# Patient Record
Sex: Male | Born: 1965 | Race: White | Marital: Single
Health system: Southern US, Community
[De-identification: ages and names within clinical notes are randomized; demographics above are authoritative.]

## PROBLEM LIST (undated history)

## (undated) DIAGNOSIS — I10 Essential (primary) hypertension: Secondary | ICD-10-CM

## (undated) DIAGNOSIS — F329 Major depressive disorder, single episode, unspecified: Secondary | ICD-10-CM

## (undated) DIAGNOSIS — J449 Chronic obstructive pulmonary disease, unspecified: Secondary | ICD-10-CM

## (undated) DIAGNOSIS — F419 Anxiety disorder, unspecified: Secondary | ICD-10-CM

## (undated) DIAGNOSIS — I639 Cerebral infarction, unspecified: Secondary | ICD-10-CM

## (undated) DIAGNOSIS — E785 Hyperlipidemia, unspecified: Secondary | ICD-10-CM

## (undated) DIAGNOSIS — K219 Gastro-esophageal reflux disease without esophagitis: Secondary | ICD-10-CM

## (undated) DIAGNOSIS — F32A Depression, unspecified: Secondary | ICD-10-CM

## (undated) DIAGNOSIS — G8929 Other chronic pain: Secondary | ICD-10-CM

## (undated) HISTORY — DX: Chronic obstructive pulmonary disease, unspecified: J44.9

## (undated) HISTORY — DX: Depression, unspecified: F32.A

## (undated) HISTORY — PX: OTHER SURGICAL HISTORY: SHX169

## (undated) HISTORY — PX: LEG SURGERY: SHX1003

## (undated) HISTORY — DX: Hyperlipidemia, unspecified: E78.5

## (undated) HISTORY — DX: Gastro-esophageal reflux disease without esophagitis: K21.9

## (undated) HISTORY — DX: Major depressive disorder, single episode, unspecified: F32.9

## (undated) HISTORY — DX: Anxiety disorder, unspecified: F41.9

## (undated) HISTORY — DX: Other chronic pain: G89.29

## (undated) HISTORY — DX: Essential (primary) hypertension: I10

## (undated) HISTORY — DX: Cerebral infarction, unspecified: I63.9

---

## 2009-06-08 ENCOUNTER — Ambulatory Visit: Payer: Self-pay

## 2010-08-11 ENCOUNTER — Ambulatory Visit: Payer: Self-pay

## 2011-02-09 ENCOUNTER — Ambulatory Visit: Payer: Self-pay | Admitting: Internal Medicine

## 2011-02-09 DIAGNOSIS — E041 Nontoxic single thyroid nodule: Secondary | ICD-10-CM | POA: Diagnosis not present

## 2011-02-09 DIAGNOSIS — E042 Nontoxic multinodular goiter: Secondary | ICD-10-CM | POA: Diagnosis not present

## 2011-02-15 DIAGNOSIS — F17200 Nicotine dependence, unspecified, uncomplicated: Secondary | ICD-10-CM | POA: Diagnosis not present

## 2011-02-15 DIAGNOSIS — Z23 Encounter for immunization: Secondary | ICD-10-CM | POA: Diagnosis not present

## 2011-02-15 DIAGNOSIS — R0602 Shortness of breath: Secondary | ICD-10-CM | POA: Diagnosis not present

## 2011-05-23 DIAGNOSIS — E018 Other iodine-deficiency related thyroid disorders and allied conditions: Secondary | ICD-10-CM | POA: Diagnosis not present

## 2011-05-23 DIAGNOSIS — I10 Essential (primary) hypertension: Secondary | ICD-10-CM | POA: Diagnosis not present

## 2011-05-23 DIAGNOSIS — F17200 Nicotine dependence, unspecified, uncomplicated: Secondary | ICD-10-CM | POA: Diagnosis not present

## 2011-05-23 DIAGNOSIS — R0602 Shortness of breath: Secondary | ICD-10-CM | POA: Diagnosis not present

## 2011-05-23 DIAGNOSIS — E049 Nontoxic goiter, unspecified: Secondary | ICD-10-CM | POA: Diagnosis not present

## 2011-07-07 DIAGNOSIS — F17200 Nicotine dependence, unspecified, uncomplicated: Secondary | ICD-10-CM | POA: Diagnosis not present

## 2011-07-07 DIAGNOSIS — J45909 Unspecified asthma, uncomplicated: Secondary | ICD-10-CM | POA: Diagnosis not present

## 2011-07-07 DIAGNOSIS — I10 Essential (primary) hypertension: Secondary | ICD-10-CM | POA: Diagnosis not present

## 2011-07-07 DIAGNOSIS — R0602 Shortness of breath: Secondary | ICD-10-CM | POA: Diagnosis not present

## 2011-09-07 DIAGNOSIS — I10 Essential (primary) hypertension: Secondary | ICD-10-CM | POA: Diagnosis not present

## 2011-09-07 DIAGNOSIS — M545 Low back pain: Secondary | ICD-10-CM | POA: Diagnosis not present

## 2011-09-07 DIAGNOSIS — F17200 Nicotine dependence, unspecified, uncomplicated: Secondary | ICD-10-CM | POA: Diagnosis not present

## 2011-09-07 DIAGNOSIS — M1990 Unspecified osteoarthritis, unspecified site: Secondary | ICD-10-CM | POA: Diagnosis not present

## 2011-10-17 DIAGNOSIS — L02419 Cutaneous abscess of limb, unspecified: Secondary | ICD-10-CM | POA: Diagnosis not present

## 2011-10-17 DIAGNOSIS — J45909 Unspecified asthma, uncomplicated: Secondary | ICD-10-CM | POA: Diagnosis not present

## 2011-10-17 DIAGNOSIS — M1990 Unspecified osteoarthritis, unspecified site: Secondary | ICD-10-CM | POA: Diagnosis not present

## 2011-10-17 DIAGNOSIS — L03119 Cellulitis of unspecified part of limb: Secondary | ICD-10-CM | POA: Diagnosis not present

## 2011-10-17 DIAGNOSIS — I10 Essential (primary) hypertension: Secondary | ICD-10-CM | POA: Diagnosis not present

## 2011-12-11 DIAGNOSIS — I10 Essential (primary) hypertension: Secondary | ICD-10-CM | POA: Diagnosis not present

## 2011-12-11 DIAGNOSIS — M1990 Unspecified osteoarthritis, unspecified site: Secondary | ICD-10-CM | POA: Diagnosis not present

## 2011-12-11 DIAGNOSIS — M545 Low back pain: Secondary | ICD-10-CM | POA: Diagnosis not present

## 2011-12-11 DIAGNOSIS — J45909 Unspecified asthma, uncomplicated: Secondary | ICD-10-CM | POA: Diagnosis not present

## 2011-12-11 DIAGNOSIS — K219 Gastro-esophageal reflux disease without esophagitis: Secondary | ICD-10-CM | POA: Diagnosis not present

## 2012-01-09 DIAGNOSIS — E782 Mixed hyperlipidemia: Secondary | ICD-10-CM | POA: Diagnosis not present

## 2012-01-09 DIAGNOSIS — E038 Other specified hypothyroidism: Secondary | ICD-10-CM | POA: Diagnosis not present

## 2012-02-01 DIAGNOSIS — F329 Major depressive disorder, single episode, unspecified: Secondary | ICD-10-CM | POA: Diagnosis not present

## 2012-02-13 DIAGNOSIS — F329 Major depressive disorder, single episode, unspecified: Secondary | ICD-10-CM | POA: Diagnosis not present

## 2012-03-20 DIAGNOSIS — F329 Major depressive disorder, single episode, unspecified: Secondary | ICD-10-CM | POA: Diagnosis not present

## 2012-04-30 DIAGNOSIS — M545 Low back pain: Secondary | ICD-10-CM | POA: Diagnosis not present

## 2012-04-30 DIAGNOSIS — F17200 Nicotine dependence, unspecified, uncomplicated: Secondary | ICD-10-CM | POA: Diagnosis not present

## 2012-04-30 DIAGNOSIS — J45909 Unspecified asthma, uncomplicated: Secondary | ICD-10-CM | POA: Diagnosis not present

## 2012-04-30 DIAGNOSIS — M1990 Unspecified osteoarthritis, unspecified site: Secondary | ICD-10-CM | POA: Diagnosis not present

## 2012-04-30 DIAGNOSIS — L02419 Cutaneous abscess of limb, unspecified: Secondary | ICD-10-CM | POA: Diagnosis not present

## 2012-04-30 DIAGNOSIS — F329 Major depressive disorder, single episode, unspecified: Secondary | ICD-10-CM | POA: Diagnosis not present

## 2012-04-30 DIAGNOSIS — K219 Gastro-esophageal reflux disease without esophagitis: Secondary | ICD-10-CM | POA: Diagnosis not present

## 2012-04-30 DIAGNOSIS — L03119 Cellulitis of unspecified part of limb: Secondary | ICD-10-CM | POA: Diagnosis not present

## 2012-04-30 DIAGNOSIS — Z006 Encounter for examination for normal comparison and control in clinical research program: Secondary | ICD-10-CM | POA: Diagnosis not present

## 2012-04-30 DIAGNOSIS — I10 Essential (primary) hypertension: Secondary | ICD-10-CM | POA: Diagnosis not present

## 2012-07-18 DIAGNOSIS — F329 Major depressive disorder, single episode, unspecified: Secondary | ICD-10-CM | POA: Diagnosis not present

## 2012-08-13 DIAGNOSIS — R0602 Shortness of breath: Secondary | ICD-10-CM | POA: Diagnosis not present

## 2012-08-13 DIAGNOSIS — D518 Other vitamin B12 deficiency anemias: Secondary | ICD-10-CM | POA: Diagnosis not present

## 2012-08-13 DIAGNOSIS — R7989 Other specified abnormal findings of blood chemistry: Secondary | ICD-10-CM | POA: Diagnosis not present

## 2012-08-13 DIAGNOSIS — Z125 Encounter for screening for malignant neoplasm of prostate: Secondary | ICD-10-CM | POA: Diagnosis not present

## 2012-08-15 DIAGNOSIS — F329 Major depressive disorder, single episode, unspecified: Secondary | ICD-10-CM | POA: Diagnosis not present

## 2012-08-15 DIAGNOSIS — R109 Unspecified abdominal pain: Secondary | ICD-10-CM | POA: Diagnosis not present

## 2012-08-28 DIAGNOSIS — K219 Gastro-esophageal reflux disease without esophagitis: Secondary | ICD-10-CM | POA: Diagnosis not present

## 2012-08-28 DIAGNOSIS — J45909 Unspecified asthma, uncomplicated: Secondary | ICD-10-CM | POA: Diagnosis not present

## 2012-08-28 DIAGNOSIS — M1990 Unspecified osteoarthritis, unspecified site: Secondary | ICD-10-CM | POA: Diagnosis not present

## 2012-08-28 DIAGNOSIS — R109 Unspecified abdominal pain: Secondary | ICD-10-CM | POA: Diagnosis not present

## 2012-08-28 DIAGNOSIS — I10 Essential (primary) hypertension: Secondary | ICD-10-CM | POA: Diagnosis not present

## 2012-08-28 DIAGNOSIS — K8010 Calculus of gallbladder with chronic cholecystitis without obstruction: Secondary | ICD-10-CM | POA: Diagnosis not present

## 2012-09-02 ENCOUNTER — Encounter: Payer: Self-pay | Admitting: *Deleted

## 2012-09-11 ENCOUNTER — Other Ambulatory Visit: Payer: Self-pay | Admitting: Pain Medicine

## 2012-09-11 ENCOUNTER — Ambulatory Visit: Payer: Self-pay | Admitting: Pain Medicine

## 2012-09-11 DIAGNOSIS — M47817 Spondylosis without myelopathy or radiculopathy, lumbosacral region: Secondary | ICD-10-CM | POA: Diagnosis not present

## 2012-09-11 DIAGNOSIS — IMO0002 Reserved for concepts with insufficient information to code with codable children: Secondary | ICD-10-CM | POA: Diagnosis not present

## 2012-09-11 DIAGNOSIS — Z79899 Other long term (current) drug therapy: Secondary | ICD-10-CM | POA: Diagnosis not present

## 2012-09-11 DIAGNOSIS — M79609 Pain in unspecified limb: Secondary | ICD-10-CM | POA: Diagnosis not present

## 2012-09-11 DIAGNOSIS — G894 Chronic pain syndrome: Secondary | ICD-10-CM | POA: Diagnosis not present

## 2012-09-11 DIAGNOSIS — R52 Pain, unspecified: Secondary | ICD-10-CM | POA: Diagnosis not present

## 2012-09-11 DIAGNOSIS — M545 Low back pain: Secondary | ICD-10-CM | POA: Diagnosis not present

## 2012-09-11 DIAGNOSIS — IMO0001 Reserved for inherently not codable concepts without codable children: Secondary | ICD-10-CM | POA: Diagnosis not present

## 2012-09-11 LAB — SEDIMENTATION RATE: Erythrocyte Sed Rate: 4 mm/hr (ref 0–15)

## 2012-09-17 ENCOUNTER — Encounter: Payer: Self-pay | Admitting: General Surgery

## 2012-09-17 ENCOUNTER — Ambulatory Visit (INDEPENDENT_AMBULATORY_CARE_PROVIDER_SITE_OTHER): Payer: Medicare Other | Admitting: General Surgery

## 2012-09-17 VITALS — BP 162/94 | HR 78 | Resp 14 | Ht 76.0 in | Wt 193.0 lb

## 2012-09-17 DIAGNOSIS — K8010 Calculus of gallbladder with chronic cholecystitis without obstruction: Secondary | ICD-10-CM | POA: Diagnosis not present

## 2012-09-17 DIAGNOSIS — K8020 Calculus of gallbladder without cholecystitis without obstruction: Secondary | ICD-10-CM | POA: Insufficient documentation

## 2012-09-17 NOTE — Patient Instructions (Addendum)

## 2012-09-17 NOTE — Progress Notes (Signed)
Patient ID: Gerald Martin, male   DOB: [birth year], 47 y.o.   MRN: [id number]  Chief Complaint  Patient presents with  . Abdominal Pain    gall stones    HPI Gerald Martin is a 47 y.o. male.  Patient here today for an evaluation of gall stones referred by Dr Burney Gauze.  States that he noticed a little pain that comes and goes in right side couple months ago.  It does seem to be occurring frequently and lasts up to 30 mins.. During episodes he finds it difficult to get comfortable.  No nausea, vomiting, constipation or diarrhea noted. An ultrasound was done 08-15-12. He is here today with his primary care giver who is also his POA, he lives alone. 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 HPI  Past Medical History  Diagnosis Date  . Chronic pain     back  . Hypertension   . Hyperlipidemia   . GERD (gastroesophageal reflux disease)   . Anxiety   . Depression   . COPD (chronic obstructive pulmonary disease)     Past Surgical History  Procedure Laterality Date  . Arm surgery[other] Left     chain saw injury  . Leg surgery Right   . Chronic pain      Family History  Problem Relation Age of Onset  . Diabetes Father   . Stroke Mother     Social History History  Substance Use Topics  . Smoking status: Current Every Day Smoker -- 2.00 packs/day for 30 years  . Smokeless tobacco: Not on file  . Alcohol Use: Yes    No Known Allergies  Current Outpatient Prescriptions  Medication Sig Dispense Refill  . aspirin 81 MG tablet Take 81 mg by mouth daily.      . cyclobenzaprine (FLEXERIL) 10 MG tablet Take 10 mg by mouth daily.      Marland Kitchen FLUoxetine (PROZAC) 20 MG capsule Take 60 mg by mouth daily.      . hydrochlorothiazide (HYDRODIURIL) 12.5 MG tablet Take 12.5 mg by mouth daily.      Marland Kitchen HYDROcodone-acetaminophen (NORCO/VICODIN) 5-325 MG per tablet Take 1 tablet by mouth every 8 (eight) hours as needed for pain.      Marland Kitchen lisinopril (PRINIVIL,ZESTRIL) 20 MG tablet Take 20 mg by mouth daily.      .  naproxen sodium (ANAPROX) 550 MG tablet Take 550 mg by mouth 2 (two) times daily with a meal.      . ranitidine (ZANTAC) 150 MG tablet Take 150 mg by mouth 2 (two) times daily.      Marland Kitchen tiotropium (SPIRIVA) 18 MCG inhalation capsule Place 18 mcg into inhaler and inhale daily.      . traMADol (ULTRAM) 50 MG tablet Take 50 mg by mouth every 6 (six) hours as needed for pain.       No current facility-administered medications for this visit.    Review of Systems Review of Systems  Constitutional: Negative.   Respiratory: Negative.   Cardiovascular: Negative.     Blood pressure 162/94, pulse 78, resp. rate 14, height 6\' 4"  (1.93 m), weight 193 lb (87.544 kg).  Physical Exam Physical Exam  Constitutional: He is oriented to person, place, and time. He appears well-developed and well-nourished.  Eyes: Conjunctivae are normal. No scleral icterus.  Cardiovascular: Normal rate, regular rhythm and normal heart sounds.   Pulmonary/Chest: Breath sounds normal.  Abdominal: Bowel sounds are normal. There is no hepatomegaly. There is tenderness (mild ) in the right upper  quadrant. There is no rigidity and no guarding. No hernia.  Lymphadenopathy:    He has no cervical adenopathy.  Neurological: He is alert and oriented to person, place, and time.  Skin: Skin is warm and dry.    Data Reviewed Notes from Dr. Welton Flakes and ultrasound reviewed. Patient has multiple gallstones. Assessment    Likely having biliary colic.     Plan   Recommend laparoscopy and cholecystectomy. Risks and benefits discussed. Patient and his POA, Donata Duff (family friend), are agreeable to surgery. She will bring a copy of POA paperwork to the office for our records.  Patient's surgery has been scheduled for 09-25-12 at [facility]. It is okay for patient to continue 81 mg aspirin.        Aiyden Lauderback G 09/17/2012, 8:30 PM

## 2012-09-18 ENCOUNTER — Ambulatory Visit: Payer: Self-pay | Admitting: Pain Medicine

## 2012-09-18 DIAGNOSIS — M545 Low back pain, unspecified: Secondary | ICD-10-CM | POA: Diagnosis not present

## 2012-09-18 DIAGNOSIS — M5126 Other intervertebral disc displacement, lumbar region: Secondary | ICD-10-CM | POA: Diagnosis not present

## 2012-09-18 DIAGNOSIS — M47817 Spondylosis without myelopathy or radiculopathy, lumbosacral region: Secondary | ICD-10-CM | POA: Diagnosis not present

## 2012-09-18 DIAGNOSIS — IMO0002 Reserved for concepts with insufficient information to code with codable children: Secondary | ICD-10-CM | POA: Diagnosis not present

## 2012-09-18 NOTE — Addendum Note (Signed)
Addended by: Kieth Brightly on: 09/18/2012 04:52 PM   Modules accepted: Orders

## 2012-09-19 ENCOUNTER — Telehealth: Payer: Self-pay | Admitting: *Deleted

## 2012-09-19 NOTE — Telephone Encounter (Signed)
Message was left for POA, Donata Duff, to see when she would be stopping by with POA paperwork. Patient is presently scheduled for surgery on 09-25-12 at [facility].

## 2012-09-24 ENCOUNTER — Encounter: Payer: Self-pay | Admitting: General Surgery

## 2012-09-24 ENCOUNTER — Ambulatory Visit: Payer: Self-pay | Admitting: General Surgery

## 2012-09-24 DIAGNOSIS — K8020 Calculus of gallbladder without cholecystitis without obstruction: Secondary | ICD-10-CM | POA: Diagnosis not present

## 2012-09-24 DIAGNOSIS — Z01810 Encounter for preprocedural cardiovascular examination: Secondary | ICD-10-CM | POA: Diagnosis not present

## 2012-09-24 DIAGNOSIS — I10 Essential (primary) hypertension: Secondary | ICD-10-CM | POA: Diagnosis not present

## 2012-09-24 DIAGNOSIS — Z01812 Encounter for preprocedural laboratory examination: Secondary | ICD-10-CM | POA: Diagnosis not present

## 2012-09-24 LAB — CBC WITH DIFFERENTIAL/PLATELET
Basophil %: 1.3 %
Eosinophil %: 4.3 %
HCT: 45.6 % (ref 40.0–52.0)
HGB: 16 g/dL (ref 13.0–18.0)
Lymphocyte #: 1.5 10*3/uL (ref 1.0–3.6)
Lymphocyte %: 20.9 %
MCH: 35.2 pg — ABNORMAL HIGH (ref 26.0–34.0)
MCHC: 35.2 g/dL (ref 32.0–36.0)
MCV: 100 fL (ref 80–100)
Monocyte %: 6.6 %
Neutrophil %: 66.9 %
RDW: 13.3 % (ref 11.5–14.5)

## 2012-09-25 ENCOUNTER — Ambulatory Visit: Payer: Self-pay | Admitting: General Surgery

## 2012-09-25 ENCOUNTER — Encounter: Payer: Self-pay | Admitting: General Surgery

## 2012-09-25 DIAGNOSIS — Z7982 Long term (current) use of aspirin: Secondary | ICD-10-CM | POA: Diagnosis not present

## 2012-09-25 DIAGNOSIS — K8050 Calculus of bile duct without cholangitis or cholecystitis without obstruction: Secondary | ICD-10-CM | POA: Diagnosis not present

## 2012-09-25 DIAGNOSIS — J449 Chronic obstructive pulmonary disease, unspecified: Secondary | ICD-10-CM | POA: Diagnosis not present

## 2012-09-25 DIAGNOSIS — Z823 Family history of stroke: Secondary | ICD-10-CM | POA: Diagnosis not present

## 2012-09-25 DIAGNOSIS — I10 Essential (primary) hypertension: Secondary | ICD-10-CM | POA: Diagnosis not present

## 2012-09-25 DIAGNOSIS — K219 Gastro-esophageal reflux disease without esophagitis: Secondary | ICD-10-CM | POA: Diagnosis not present

## 2012-09-25 DIAGNOSIS — F329 Major depressive disorder, single episode, unspecified: Secondary | ICD-10-CM | POA: Diagnosis not present

## 2012-09-25 DIAGNOSIS — E785 Hyperlipidemia, unspecified: Secondary | ICD-10-CM | POA: Diagnosis not present

## 2012-09-25 DIAGNOSIS — F17200 Nicotine dependence, unspecified, uncomplicated: Secondary | ICD-10-CM | POA: Diagnosis not present

## 2012-09-25 DIAGNOSIS — F411 Generalized anxiety disorder: Secondary | ICD-10-CM | POA: Diagnosis not present

## 2012-09-25 DIAGNOSIS — Z833 Family history of diabetes mellitus: Secondary | ICD-10-CM | POA: Diagnosis not present

## 2012-09-25 DIAGNOSIS — Z79899 Other long term (current) drug therapy: Secondary | ICD-10-CM | POA: Diagnosis not present

## 2012-09-25 DIAGNOSIS — K8010 Calculus of gallbladder with chronic cholecystitis without obstruction: Secondary | ICD-10-CM

## 2012-09-25 DIAGNOSIS — F0390 Unspecified dementia without behavioral disturbance: Secondary | ICD-10-CM | POA: Diagnosis not present

## 2012-09-25 DIAGNOSIS — M549 Dorsalgia, unspecified: Secondary | ICD-10-CM | POA: Diagnosis not present

## 2012-09-25 DIAGNOSIS — K8020 Calculus of gallbladder without cholecystitis without obstruction: Secondary | ICD-10-CM | POA: Diagnosis not present

## 2012-09-25 DIAGNOSIS — D649 Anemia, unspecified: Secondary | ICD-10-CM | POA: Diagnosis not present

## 2012-09-26 ENCOUNTER — Encounter: Payer: Self-pay | Admitting: General Surgery

## 2012-09-27 LAB — PATHOLOGY REPORT

## 2012-10-07 ENCOUNTER — Encounter: Payer: Self-pay | Admitting: General Surgery

## 2012-10-07 ENCOUNTER — Ambulatory Visit (INDEPENDENT_AMBULATORY_CARE_PROVIDER_SITE_OTHER): Payer: Medicare Other | Admitting: General Surgery

## 2012-10-07 VITALS — BP 120/68 | HR 82 | Resp 14 | Ht 76.0 in | Wt 185.0 lb

## 2012-10-07 DIAGNOSIS — K8010 Calculus of gallbladder with chronic cholecystitis without obstruction: Secondary | ICD-10-CM | POA: Insufficient documentation

## 2012-10-07 NOTE — Progress Notes (Signed)
Patient ID: Gerald Martin, male   DOB: Dec 24, 1965, 47 y.o.   MRN: [id number]  The patient is here for a post op gallbladder removal. The procedure was done on 09/25/12. No complaints at this time.  Well healed port sites. Normal bowel sounds. Lungs are clear.

## 2012-10-07 NOTE — Patient Instructions (Addendum)
Patient to return as needed. 

## 2012-10-10 DIAGNOSIS — F4542 Pain disorder with related psychological factors: Secondary | ICD-10-CM | POA: Diagnosis not present

## 2012-10-10 DIAGNOSIS — F332 Major depressive disorder, recurrent severe without psychotic features: Secondary | ICD-10-CM | POA: Diagnosis not present

## 2012-10-11 DIAGNOSIS — F329 Major depressive disorder, single episode, unspecified: Secondary | ICD-10-CM | POA: Diagnosis not present

## 2013-02-24 ENCOUNTER — Emergency Department: Payer: Self-pay | Admitting: Emergency Medicine

## 2013-02-24 DIAGNOSIS — R4789 Other speech disturbances: Secondary | ICD-10-CM | POA: Diagnosis not present

## 2013-02-24 LAB — COMPREHENSIVE METABOLIC PANEL
ALK PHOS: 104 U/L
ALT: 61 U/L (ref 12–78)
Albumin: 3.5 g/dL (ref 3.4–5.0)
Anion Gap: 5 — ABNORMAL LOW (ref 7–16)
BUN: 8 mg/dL (ref 7–18)
Bilirubin,Total: 0.3 mg/dL (ref 0.2–1.0)
CHLORIDE: 107 mmol/L (ref 98–107)
CREATININE: 0.8 mg/dL (ref 0.60–1.30)
Calcium, Total: 8.4 mg/dL — ABNORMAL LOW (ref 8.5–10.1)
Co2: 30 mmol/L (ref 21–32)
EGFR (African American): >60
Glucose: 94 mg/dL (ref 65–99)
Osmolality: 281 (ref 275–301)
Potassium: 3.6 mmol/L (ref 3.5–5.1)
SGOT(AST): 55 U/L — ABNORMAL HIGH (ref 15–37)
SODIUM: 142 mmol/L (ref 136–145)
Total Protein: 7.8 g/dL (ref 6.4–8.2)

## 2013-02-24 LAB — CBC WITH DIFFERENTIAL/PLATELET
BASOS ABS: 0.2 10*3/uL — AB (ref 0.0–0.1)
BASOS PCT: 1.4 %
EOS ABS: 0.2 10*3/uL (ref 0.0–0.7)
Eosinophil %: 2.2 %
HCT: 53.5 % — ABNORMAL HIGH (ref 40.0–52.0)
HGB: 17.8 g/dL (ref 13.0–18.0)
LYMPHS ABS: 3.5 10*3/uL (ref 1.0–3.6)
LYMPHS PCT: 33.1 %
MCH: 32.2 pg (ref 26.0–34.0)
MCHC: 33.3 g/dL (ref 32.0–36.0)
MCV: 97 fL (ref 80–100)
Monocyte #: 0.6 x10 3/mm (ref 0.2–1.0)
Monocyte %: 5.9 %
NEUTROS ABS: 6 10*3/uL (ref 1.4–6.5)
Neutrophil %: 57.4 %
PLATELETS: 303 10*3/uL (ref 150–440)
RBC: 5.52 10*6/uL (ref 4.40–5.90)
RDW: 13.2 % (ref 11.5–14.5)
WBC: 10.5 10*3/uL (ref 3.8–10.6)

## 2013-02-24 LAB — SALICYLATE LEVEL: Salicylates, Serum: 2 mg/dL

## 2013-02-24 LAB — ACETAMINOPHEN LEVEL

## 2013-02-24 LAB — ETHANOL
Ethanol %: 0.421 % (ref 0.000–0.080)
Ethanol: 421 mg/dL

## 2013-02-25 LAB — URINALYSIS, COMPLETE
Bacteria: NONE SEEN
Bilirubin,UR: NEGATIVE
Blood: NEGATIVE
GLUCOSE, UR: NEGATIVE mg/dL (ref 0–75)
KETONE: NEGATIVE
Leukocyte Esterase: NEGATIVE
NITRITE: NEGATIVE
PH: 6 (ref 4.5–8.0)
Protein: NEGATIVE
Specific Gravity: 1.004 (ref 1.003–1.030)
Squamous Epithelial: <1
WBC UR: NONE SEEN /HPF (ref 0–5)

## 2013-02-25 LAB — DRUG SCREEN, URINE

## 2013-02-27 DIAGNOSIS — R0602 Shortness of breath: Secondary | ICD-10-CM | POA: Diagnosis not present

## 2013-02-27 DIAGNOSIS — I10 Essential (primary) hypertension: Secondary | ICD-10-CM | POA: Diagnosis not present

## 2013-02-27 DIAGNOSIS — M545 Low back pain, unspecified: Secondary | ICD-10-CM | POA: Diagnosis not present

## 2013-02-27 DIAGNOSIS — F17200 Nicotine dependence, unspecified, uncomplicated: Secondary | ICD-10-CM | POA: Diagnosis not present

## 2013-02-27 DIAGNOSIS — J45909 Unspecified asthma, uncomplicated: Secondary | ICD-10-CM | POA: Diagnosis not present

## 2013-02-27 DIAGNOSIS — K219 Gastro-esophageal reflux disease without esophagitis: Secondary | ICD-10-CM | POA: Diagnosis not present

## 2013-07-02 DIAGNOSIS — J45909 Unspecified asthma, uncomplicated: Secondary | ICD-10-CM | POA: Diagnosis not present

## 2013-07-02 DIAGNOSIS — M25579 Pain in unspecified ankle and joints of unspecified foot: Secondary | ICD-10-CM | POA: Diagnosis not present

## 2013-07-02 DIAGNOSIS — F17200 Nicotine dependence, unspecified, uncomplicated: Secondary | ICD-10-CM | POA: Diagnosis not present

## 2013-07-02 DIAGNOSIS — M545 Low back pain, unspecified: Secondary | ICD-10-CM | POA: Diagnosis not present

## 2013-07-02 DIAGNOSIS — M1990 Unspecified osteoarthritis, unspecified site: Secondary | ICD-10-CM | POA: Diagnosis not present

## 2013-07-02 DIAGNOSIS — I10 Essential (primary) hypertension: Secondary | ICD-10-CM | POA: Diagnosis not present

## 2013-09-12 DIAGNOSIS — I10 Essential (primary) hypertension: Secondary | ICD-10-CM | POA: Diagnosis not present

## 2013-09-12 DIAGNOSIS — Z0000 Encounter for general adult medical examination without abnormal findings: Secondary | ICD-10-CM | POA: Diagnosis not present

## 2013-09-12 DIAGNOSIS — E782 Mixed hyperlipidemia: Secondary | ICD-10-CM | POA: Diagnosis not present

## 2013-09-12 DIAGNOSIS — Z125 Encounter for screening for malignant neoplasm of prostate: Secondary | ICD-10-CM | POA: Diagnosis not present

## 2013-09-16 ENCOUNTER — Emergency Department: Payer: Self-pay | Admitting: Emergency Medicine

## 2013-09-16 DIAGNOSIS — S139XXA Sprain of joints and ligaments of unspecified parts of neck, initial encounter: Secondary | ICD-10-CM | POA: Diagnosis not present

## 2013-09-16 DIAGNOSIS — I10 Essential (primary) hypertension: Secondary | ICD-10-CM | POA: Diagnosis not present

## 2013-10-21 DIAGNOSIS — F17200 Nicotine dependence, unspecified, uncomplicated: Secondary | ICD-10-CM | POA: Diagnosis not present

## 2013-10-21 DIAGNOSIS — M542 Cervicalgia: Secondary | ICD-10-CM | POA: Diagnosis not present

## 2013-10-21 DIAGNOSIS — M25519 Pain in unspecified shoulder: Secondary | ICD-10-CM | POA: Diagnosis not present

## 2013-10-21 DIAGNOSIS — I10 Essential (primary) hypertension: Secondary | ICD-10-CM | POA: Diagnosis not present

## 2013-10-21 DIAGNOSIS — M25539 Pain in unspecified wrist: Secondary | ICD-10-CM | POA: Diagnosis not present

## 2013-10-21 DIAGNOSIS — D509 Iron deficiency anemia, unspecified: Secondary | ICD-10-CM | POA: Diagnosis not present

## 2013-11-07 DIAGNOSIS — S161XXA Strain of muscle, fascia and tendon at neck level, initial encounter: Secondary | ICD-10-CM | POA: Diagnosis not present

## 2013-11-07 DIAGNOSIS — M542 Cervicalgia: Secondary | ICD-10-CM | POA: Diagnosis not present

## 2013-11-07 DIAGNOSIS — M25512 Pain in left shoulder: Secondary | ICD-10-CM | POA: Diagnosis not present

## 2013-11-07 DIAGNOSIS — M47812 Spondylosis without myelopathy or radiculopathy, cervical region: Secondary | ICD-10-CM | POA: Diagnosis not present

## 2013-11-07 DIAGNOSIS — S40012A Contusion of left shoulder, initial encounter: Secondary | ICD-10-CM | POA: Diagnosis not present

## 2014-03-06 DIAGNOSIS — K219 Gastro-esophageal reflux disease without esophagitis: Secondary | ICD-10-CM | POA: Diagnosis not present

## 2014-03-06 DIAGNOSIS — J45909 Unspecified asthma, uncomplicated: Secondary | ICD-10-CM | POA: Diagnosis not present

## 2014-03-06 DIAGNOSIS — I10 Essential (primary) hypertension: Secondary | ICD-10-CM | POA: Diagnosis not present

## 2014-03-06 DIAGNOSIS — M542 Cervicalgia: Secondary | ICD-10-CM | POA: Diagnosis not present

## 2014-03-06 DIAGNOSIS — M545 Low back pain: Secondary | ICD-10-CM | POA: Diagnosis not present

## 2014-03-06 DIAGNOSIS — R0602 Shortness of breath: Secondary | ICD-10-CM | POA: Diagnosis not present

## 2014-03-06 DIAGNOSIS — F17210 Nicotine dependence, cigarettes, uncomplicated: Secondary | ICD-10-CM | POA: Diagnosis not present

## 2014-04-23 DIAGNOSIS — I10 Essential (primary) hypertension: Secondary | ICD-10-CM | POA: Diagnosis not present

## 2014-04-23 DIAGNOSIS — F17210 Nicotine dependence, cigarettes, uncomplicated: Secondary | ICD-10-CM | POA: Diagnosis not present

## 2014-04-23 DIAGNOSIS — R079 Chest pain, unspecified: Secondary | ICD-10-CM | POA: Diagnosis not present

## 2014-04-30 ENCOUNTER — Ambulatory Visit
Admit: 2014-04-30 | Disposition: A | Discharge status: Home / Self Care | Payer: Self-pay | Attending: Internal Medicine | Admitting: Internal Medicine

## 2014-04-30 DIAGNOSIS — I10 Essential (primary) hypertension: Secondary | ICD-10-CM | POA: Diagnosis not present

## 2014-04-30 DIAGNOSIS — I208 Other forms of angina pectoris: Secondary | ICD-10-CM | POA: Diagnosis not present

## 2014-04-30 DIAGNOSIS — I119 Hypertensive heart disease without heart failure: Secondary | ICD-10-CM | POA: Diagnosis not present

## 2014-04-30 DIAGNOSIS — R079 Chest pain, unspecified: Secondary | ICD-10-CM | POA: Diagnosis not present

## 2014-04-30 DIAGNOSIS — F17210 Nicotine dependence, cigarettes, uncomplicated: Secondary | ICD-10-CM | POA: Diagnosis not present

## 2014-05-07 DIAGNOSIS — D509 Iron deficiency anemia, unspecified: Secondary | ICD-10-CM | POA: Insufficient documentation

## 2014-05-07 DIAGNOSIS — Z720 Tobacco use: Secondary | ICD-10-CM | POA: Insufficient documentation

## 2014-05-07 DIAGNOSIS — F32A Depression, unspecified: Secondary | ICD-10-CM | POA: Insufficient documentation

## 2014-05-07 DIAGNOSIS — I10 Essential (primary) hypertension: Secondary | ICD-10-CM | POA: Insufficient documentation

## 2014-05-07 DIAGNOSIS — K219 Gastro-esophageal reflux disease without esophagitis: Secondary | ICD-10-CM | POA: Insufficient documentation

## 2014-05-07 DIAGNOSIS — R0602 Shortness of breath: Secondary | ICD-10-CM | POA: Insufficient documentation

## 2014-05-07 DIAGNOSIS — F329 Major depressive disorder, single episode, unspecified: Secondary | ICD-10-CM | POA: Insufficient documentation

## 2014-05-15 NOTE — Op Note (Signed)
PATIENT NAME:  Gerald Martin, Gerald Martin MR#:  [id number] DATE OF BIRTH:  May 22, 1965  DATE OF PROCEDURE:  09/25/2012  PREOPERATIVE DIAGNOSIS: Chronic cholecystitis and cholelithiasis.   POSTOPERATIVE DIAGNOSIS: Chronic cholecystitis and cholelithiasis.   OPERATION PERFORMED: Laparoscopy, cholecystectomy with intraoperative cholangiogram.   SURGEON: S.G. Evette CristalSankar, MD  ANESTHESIA: General.   COMPLICATIONS: None.   ESTIMATED BLOOD LOSS: Less than 25 mL.   DRAINS: None.   DESCRIPTION OF PROCEDURE: The patient was put to sleep in the supine position on the operating table. A small incision was made just on the upper lip of the umbilicus, and the Veress needle with the InnerDyne sleeve positioned in the peritoneal cavity and verified with the hanging drop method. Pneumoperitoneum was obtained, and a 10 mm port was placed. The camera was introduced, with good visualization of the peritoneal cavity. Epigastric and 2 lateral 5 mm ports were placed. The gallbladder was inspected and noted to be mildly to moderately thickened in its wall, and some stones were visible bulging through the midportion in the Hartmann's pouch area. With careful cephalad traction, the adhesions surrounding the gallbladder were taken down easily until the cystic duct area was approached, and this was then freed. A Kumar clamp and catheter were positioned after milking the stones upward, and cholangiogram was performed. There was prompt filling of the bile duct both proximal and distal, with no evidence of filling defects and no obstruction to flow. The catheter was removed. The cystic duct was hemo-clipped and cut. The cystic artery was then dissected, and it was noted the right hepatic was going up a little bit into the gallbladder before giving off a posterior and a main cystic artery branch. These were separately freed, hemo-clipped and cut, and the gallbladder was then dissected free from its bed using cautery for control of bleeding. A  small amount of fluid was used to irrigate out the area and suctioned out. The clips were noted to be intact, with no bleeding encountered. The camera was replaced with the 5 mm scope in the epigastric port site, and the gallbladder was placed in retrieval bag and brought out through the umbilical port site. Because of the large stones and some of them compacted together, measuring 1 to 2 cm in size, both the skin and the fascial opening was extended slightly to allow removal. Following this, the ports were removed, and pneumoperitoneum was released. The fascial opening at the umbilicus was closed with 2 figure-of-eight stitches of 0 Vicryl. All skin incisions were closed with subcuticular 4-0 Vicryl reinforced with Steri-Strips, and dry sterile dressing was placed. The patient tolerated the procedure well. There were no immediate problems encountered.   ____________________________ S.Wynona LunaG. Prisila Dlouhy, MD sgs:OSi D: 09/26/2012 08:31:06 ET T: 09/26/2012 08:50:33 ET JOB#: [id number]  cc: Timoteo ExposeS.G. Evette CristalSankar, MD, <Dictator> [facility] CenterEEPLAPUTH Wynona LunaG Kree Rafter MD ELECTRONICALLY SIGNED 09/26/2012 20:01

## 2014-07-09 DIAGNOSIS — F17210 Nicotine dependence, cigarettes, uncomplicated: Secondary | ICD-10-CM | POA: Diagnosis not present

## 2014-07-09 DIAGNOSIS — M545 Low back pain: Secondary | ICD-10-CM | POA: Diagnosis not present

## 2014-07-09 DIAGNOSIS — I119 Hypertensive heart disease without heart failure: Secondary | ICD-10-CM | POA: Diagnosis not present

## 2014-07-09 DIAGNOSIS — Z634 Disappearance and death of family member: Secondary | ICD-10-CM | POA: Diagnosis not present

## 2014-09-13 ENCOUNTER — Emergency Department: Admission: EM | Admit: 2014-09-13 | Discharge: 2014-09-13 | Payer: Medicare Other

## 2014-09-13 NOTE — ED Notes (Signed)
Patient here with [employer] [place] for Patent examiner blood draw.  Patient is alert and oriented and gives verbal consent to have this RN draw his blood.

## 2014-09-13 NOTE — ED Notes (Signed)
Blood drawn from left antecub after being cleansed with betadine.  Specimens given to [employer] [place].

## 2014-10-13 DIAGNOSIS — I119 Hypertensive heart disease without heart failure: Secondary | ICD-10-CM | POA: Diagnosis not present

## 2014-10-13 DIAGNOSIS — F17210 Nicotine dependence, cigarettes, uncomplicated: Secondary | ICD-10-CM | POA: Diagnosis not present

## 2014-10-13 DIAGNOSIS — F331 Major depressive disorder, recurrent, moderate: Secondary | ICD-10-CM | POA: Diagnosis not present

## 2014-10-13 DIAGNOSIS — Z0000 Encounter for general adult medical examination without abnormal findings: Secondary | ICD-10-CM | POA: Diagnosis not present

## 2014-10-13 DIAGNOSIS — M545 Low back pain: Secondary | ICD-10-CM | POA: Diagnosis not present

## 2014-10-13 DIAGNOSIS — J449 Chronic obstructive pulmonary disease, unspecified: Secondary | ICD-10-CM | POA: Diagnosis not present

## 2014-10-13 DIAGNOSIS — Z0001 Encounter for general adult medical examination with abnormal findings: Secondary | ICD-10-CM | POA: Diagnosis not present

## 2014-10-13 DIAGNOSIS — K219 Gastro-esophageal reflux disease without esophagitis: Secondary | ICD-10-CM | POA: Diagnosis not present

## 2015-01-11 DIAGNOSIS — F17210 Nicotine dependence, cigarettes, uncomplicated: Secondary | ICD-10-CM | POA: Diagnosis not present

## 2015-01-11 DIAGNOSIS — J449 Chronic obstructive pulmonary disease, unspecified: Secondary | ICD-10-CM | POA: Diagnosis not present

## 2015-01-11 DIAGNOSIS — F331 Major depressive disorder, recurrent, moderate: Secondary | ICD-10-CM | POA: Diagnosis not present

## 2015-01-11 DIAGNOSIS — I119 Hypertensive heart disease without heart failure: Secondary | ICD-10-CM | POA: Diagnosis not present

## 2015-05-19 ENCOUNTER — Emergency Department: Payer: Medicare Other

## 2015-05-19 ENCOUNTER — Encounter: Payer: Self-pay | Admitting: *Deleted

## 2015-05-19 ENCOUNTER — Inpatient Hospital Stay
Admission: EM | Admit: 2015-05-19 | Discharge: 2015-05-21 | DRG: 885 | Disposition: A | Discharge status: Home / Self Care | Payer: Medicare Other | Attending: Internal Medicine | Admitting: Internal Medicine

## 2015-05-19 DIAGNOSIS — G8929 Other chronic pain: Secondary | ICD-10-CM | POA: Diagnosis present

## 2015-05-19 DIAGNOSIS — Z91199 Patient's noncompliance with other medical treatment and regimen due to unspecified reason: Secondary | ICD-10-CM

## 2015-05-19 DIAGNOSIS — J449 Chronic obstructive pulmonary disease, unspecified: Secondary | ICD-10-CM | POA: Diagnosis present

## 2015-05-19 DIAGNOSIS — F419 Anxiety disorder, unspecified: Secondary | ICD-10-CM | POA: Diagnosis present

## 2015-05-19 DIAGNOSIS — I10 Essential (primary) hypertension: Secondary | ICD-10-CM | POA: Diagnosis present

## 2015-05-19 DIAGNOSIS — F17210 Nicotine dependence, cigarettes, uncomplicated: Secondary | ICD-10-CM | POA: Diagnosis present

## 2015-05-19 DIAGNOSIS — Z9119 Patient's noncompliance with other medical treatment and regimen: Secondary | ICD-10-CM

## 2015-05-19 DIAGNOSIS — R9431 Abnormal electrocardiogram [ECG] [EKG]: Secondary | ICD-10-CM | POA: Diagnosis not present

## 2015-05-19 DIAGNOSIS — M2550 Pain in unspecified joint: Secondary | ICD-10-CM | POA: Diagnosis present

## 2015-05-19 DIAGNOSIS — M549 Dorsalgia, unspecified: Secondary | ICD-10-CM | POA: Diagnosis present

## 2015-05-19 DIAGNOSIS — R079 Chest pain, unspecified: Secondary | ICD-10-CM | POA: Diagnosis not present

## 2015-05-19 DIAGNOSIS — K219 Gastro-esophageal reflux disease without esophagitis: Secondary | ICD-10-CM | POA: Diagnosis present

## 2015-05-19 DIAGNOSIS — Z7982 Long term (current) use of aspirin: Secondary | ICD-10-CM

## 2015-05-19 DIAGNOSIS — E785 Hyperlipidemia, unspecified: Secondary | ICD-10-CM | POA: Diagnosis present

## 2015-05-19 DIAGNOSIS — R0789 Other chest pain: Secondary | ICD-10-CM | POA: Diagnosis not present

## 2015-05-19 DIAGNOSIS — F332 Major depressive disorder, recurrent severe without psychotic features: Principal | ICD-10-CM | POA: Diagnosis present

## 2015-05-19 DIAGNOSIS — F4321 Adjustment disorder with depressed mood: Secondary | ICD-10-CM

## 2015-05-19 DIAGNOSIS — F329 Major depressive disorder, single episode, unspecified: Secondary | ICD-10-CM

## 2015-05-19 DIAGNOSIS — F32A Depression, unspecified: Secondary | ICD-10-CM

## 2015-05-19 DIAGNOSIS — I119 Hypertensive heart disease without heart failure: Secondary | ICD-10-CM | POA: Diagnosis not present

## 2015-05-19 DIAGNOSIS — I169 Hypertensive crisis, unspecified: Secondary | ICD-10-CM | POA: Diagnosis not present

## 2015-05-19 LAB — BASIC METABOLIC PANEL
Anion gap: 9 (ref 5–15)
BUN: 17 mg/dL (ref 6–20)
CHLORIDE: 101 mmol/L (ref 101–111)
CO2: 28 mmol/L (ref 22–32)
CREATININE: 1.01 mg/dL (ref 0.61–1.24)
Calcium: 9.1 mg/dL (ref 8.9–10.3)
GFR calc Af Amer: >60 mL/min (ref >60)
GFR calc non Af Amer: >60 mL/min (ref >60)
GLUCOSE: 88 mg/dL (ref 65–99)
POTASSIUM: 4 mmol/L (ref 3.5–5.1)
Sodium: 138 mmol/L (ref 135–145)

## 2015-05-19 LAB — CBC
HEMATOCRIT: 46.9 % (ref 40.0–52.0)
Hemoglobin: 15.6 g/dL (ref 13.0–18.0)
MCH: 29.5 pg (ref 26.0–34.0)
MCHC: 33.3 g/dL (ref 32.0–36.0)
MCV: 88.7 fL (ref 80.0–100.0)
PLATELETS: 261 10*3/uL (ref 150–440)
RBC: 5.29 MIL/uL (ref 4.40–5.90)
RDW: 15.7 % — AB (ref 11.5–14.5)
WBC: 7.9 10*3/uL (ref 3.8–10.6)

## 2015-05-19 LAB — SEDIMENTATION RATE: Sed Rate: 4 mm/hr (ref 0–15)

## 2015-05-19 LAB — TROPONIN I: Troponin I: <0.03 ng/mL (ref <0.031)

## 2015-05-19 LAB — URIC ACID: Uric Acid, Serum: 3.9 mg/dL — ABNORMAL LOW (ref 4.4–7.6)

## 2015-05-19 MED ORDER — SODIUM CHLORIDE 0.9% FLUSH
3.0000 mL | Freq: Two times a day (BID) | INTRAVENOUS | Status: DC
  Administered 2015-05-19 – 2015-05-21 (×4): 3 mL via INTRAVENOUS

## 2015-05-19 MED ORDER — HYDRALAZINE HCL 20 MG/ML IJ SOLN
10.0000 mg | Freq: Once | INTRAMUSCULAR | Status: AC
  Administered 2015-05-19: 10 mg via INTRAVENOUS
  Filled 2015-05-19: qty 1

## 2015-05-19 MED ORDER — ASPIRIN EC 81 MG PO TBEC
81.0000 mg | DELAYED_RELEASE_TABLET | Freq: Every day | ORAL | Status: DC
  Administered 2015-05-20 – 2015-05-21 (×2): 81 mg via ORAL
  Filled 2015-05-19 (×2): qty 1

## 2015-05-19 MED ORDER — LORAZEPAM 1 MG PO TABS: 1.0000 mg | ORAL_TABLET | Freq: Four times a day (QID) | ORAL | Status: DC | PRN

## 2015-05-19 MED ORDER — NICOTINE 21 MG/24HR TD PT24
21.0000 mg | MEDICATED_PATCH | Freq: Every day | TRANSDERMAL | Status: DC
  Administered 2015-05-19 – 2015-05-21 (×3): 21 mg via TRANSDERMAL
  Filled 2015-05-19 (×3): qty 1

## 2015-05-19 MED ORDER — TIOTROPIUM BROMIDE MONOHYDRATE 18 MCG IN CAPS
18.0000 ug | ORAL_CAPSULE | Freq: Every day | RESPIRATORY_TRACT | Status: DC
  Administered 2015-05-20 – 2015-05-21 (×2): 18 ug via RESPIRATORY_TRACT
  Filled 2015-05-19: qty 5

## 2015-05-19 MED ORDER — LISINOPRIL 20 MG PO TABS
20.0000 mg | ORAL_TABLET | Freq: Every day | ORAL | Status: DC
  Administered 2015-05-19 – 2015-05-21 (×3): 20 mg via ORAL
  Filled 2015-05-19 (×2): qty 1
  Filled 2015-05-19: qty 2

## 2015-05-19 MED ORDER — FLUOXETINE HCL 20 MG PO CAPS
60.0000 mg | ORAL_CAPSULE | Freq: Every day | ORAL | Status: DC
  Administered 2015-05-19 – 2015-05-21 (×3): 60 mg via ORAL
  Filled 2015-05-19 (×3): qty 3

## 2015-05-19 MED ORDER — CYCLOBENZAPRINE HCL 10 MG PO TABS
10.0000 mg | ORAL_TABLET | Freq: Every day | ORAL | Status: DC
  Administered 2015-05-19 – 2015-05-21 (×3): 10 mg via ORAL
  Filled 2015-05-19 (×3): qty 1

## 2015-05-19 MED ORDER — ADULT MULTIVITAMIN W/MINERALS CH: 1.0000 | ORAL_TABLET | Freq: Every day | ORAL | Status: DC

## 2015-05-19 MED ORDER — ENOXAPARIN SODIUM 40 MG/0.4ML ~~LOC~~ SOLN
40.0000 mg | SUBCUTANEOUS | Status: DC
  Administered 2015-05-19: 40 mg via SUBCUTANEOUS
  Filled 2015-05-19: qty 0.4

## 2015-05-19 MED ORDER — ASPIRIN EC 325 MG PO TBEC
325.0000 mg | DELAYED_RELEASE_TABLET | Freq: Once | ORAL | Status: AC
  Administered 2015-05-19: 325 mg via ORAL
  Filled 2015-05-19: qty 1

## 2015-05-19 MED ORDER — LORAZEPAM 2 MG/ML IJ SOLN: 1.0000 mg | Freq: Four times a day (QID) | INTRAMUSCULAR | Status: DC | PRN

## 2015-05-19 MED ORDER — HYDROCHLOROTHIAZIDE 25 MG PO TABS
12.5000 mg | ORAL_TABLET | Freq: Every day | ORAL | Status: DC
  Administered 2015-05-19 – 2015-05-21 (×3): 12.5 mg via ORAL
  Filled 2015-05-19 (×3): qty 1

## 2015-05-19 MED ORDER — OXYCODONE HCL 5 MG PO TABS
5.0000 mg | ORAL_TABLET | ORAL | Status: DC | PRN
  Administered 2015-05-21: 5 mg via ORAL
  Filled 2015-05-19: qty 1

## 2015-05-19 MED ORDER — ACETAMINOPHEN 650 MG RE SUPP: 650.0000 mg | Freq: Four times a day (QID) | RECTAL | Status: DC | PRN

## 2015-05-19 MED ORDER — ACETAMINOPHEN 325 MG PO TABS
650.0000 mg | ORAL_TABLET | Freq: Four times a day (QID) | ORAL | Status: DC | PRN
  Administered 2015-05-19: 650 mg via ORAL
  Filled 2015-05-19: qty 2

## 2015-05-19 MED ORDER — FOLIC ACID 1 MG PO TABS: 1.0000 mg | ORAL_TABLET | Freq: Every day | ORAL | Status: DC

## 2015-05-19 MED ORDER — FAMOTIDINE 20 MG PO TABS
20.0000 mg | ORAL_TABLET | Freq: Two times a day (BID) | ORAL | Status: DC
  Administered 2015-05-20 – 2015-05-21 (×3): 20 mg via ORAL
  Filled 2015-05-19 (×3): qty 1

## 2015-05-19 MED ORDER — CLONIDINE HCL 0.1 MG PO TABS
0.2000 mg | ORAL_TABLET | Freq: Two times a day (BID) | ORAL | Status: DC
  Administered 2015-05-19 – 2015-05-21 (×4): 0.2 mg via ORAL
  Filled 2015-05-19 (×4): qty 2

## 2015-05-19 NOTE — H&P (Signed)
[facility] [place] [facility]   PATIENT NAME: Gerald Martin    MR#:  [id number]  DATE OF BIRTH:  07/07/1965  DATE OF ADMISSION:  05/19/2015  PRIMARY CARE PHYSICIAN: KHAN, FOZIA M, MD   REQUESTING/REFERRING PHYSICIAN: Dr. Anne-Caroline Norman  CHIEF COMPLAINT:   Chief Complaint  Patient presents with  . Chest Pain  . Hypertension    HISTORY OF PRESENT ILLNESS:  Gerald Martin  is a 50 y.o. male with a known history of Hypertension. He was sent in for very elevated blood pressure. He stopped his medications but didn't tell me the reason why he stopped his medications. As per the ER physician at May be a cost issue. The patient states that he does not feel well. He had poor eye contact the entire interview. He answer with very short answers yes or no. He seems annoyed that I was asking him questions  PAST MEDICAL HISTORY:   Past Medical History  Diagnosis Date  . Chronic pain     back  . Hypertension   . Hyperlipidemia   . GERD (gastroesophageal reflux disease)   . Anxiety   . Depression   . COPD (chronic obstructive pulmonary disease) (HCC)     PAST SURGICAL HISTORY:   Past Surgical History  Procedure Laterality Date  . Arm surgery[other] Left     chain saw injury  . Leg surgery Right   . Chronic pain      SOCIAL HISTORY:   Social History  Substance Use Topics  . Smoking status: Current Every Day Smoker -- 2.00 packs/day for 30 years  . Smokeless tobacco: Not on file  . Alcohol Use: Yes    FAMILY HISTORY:   Family History  Problem Relation Age of Onset  . Diabetes Father   . Stroke Mother     DRUG ALLERGIES:  No Known Allergies  REVIEW OF SYSTEMS:  CONSTITUTIONAL: No fever. Positive for fatigue.  EYES: No blurred or double vision. Eyes hurting the other day. EARS, NOSE, AND THROAT: No tinnitus or ear pain. Occasional sore throat and runny nose RESPIRATORY: No cough, shortness of breath, wheezing or hemoptysis.   CARDIOVASCULAR: Occasional chest pain, no orthopnea, edema.  GASTROINTESTINAL: Positive for nausea and vomiting the other day, no diarrhea. Some abdominal pain. No blood in bowel movements GENITOURINARY: No dysuria, hematuria.  ENDOCRINE: No polyuria, nocturia,  HEMATOLOGY: No anemia, easy bruising or bleeding SKIN: No rash or lesion. 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   NEUROLOGIC: Feels dizzy PSYCHIATRY: Positive for anxiety and depression. He said yes when he had thoughts of hurting himself but he said he would never do it.  MEDICATIONS AT HOME:   Prior to Admission medications   Medication Sig Start Date End Date Taking? Authorizing Provider  aspirin 81 MG tablet Take 81 mg by mouth daily.    Historical Provider, MD  cyclobenzaprine (FLEXERIL) 10 MG tablet Take 10 mg by mouth daily.    Historical Provider, MD  FLUoxetine (PROZAC) 20 MG capsule Take 60 mg by mouth daily.    Historical Provider, MD  hydrochlorothiazide (HYDRODIURIL) 12.5 MG tablet Take 12.5 mg by mouth daily.    Historical Provider, MD  HYDROcodone-acetaminophen (NORCO/VICODIN) 5-325 MG per tablet Take 1 tablet by mouth every 8 (eight) hours as needed for pain.    Historical Provider, MD  lisinopril (PRINIVIL,ZESTRIL) 20 MG tablet Take 20 mg by mouth daily.    Historical Provider, MD  naproxen sodium (ANAPROX)   550 MG tablet Take 550 mg by mouth 2 (two) times daily with a meal.    Historical Provider, MD  ranitidine (ZANTAC) 150 MG tablet Take 150 mg by mouth 2 (two) times daily.    Historical Provider, MD  tiotropium (SPIRIVA) 18 MCG inhalation capsule Place 18 mcg into inhaler and inhale daily.    Historical Provider, MD  traMADol (ULTRAM) 50 MG tablet Take 50 mg by mouth every 6 (six) hours as needed for pain.    Historical Provider, MD      VITAL SIGNS:  Blood pressure 174/122, pulse 78, temperature 98.1 F (36.7 C), temperature source Oral, resp. rate 13, height 6' 4"  (1.93 m), weight 77.111 kg (170 lb), SpO2 100 %.  PHYSICAL EXAMINATION:  GENERAL:  50 y.o.-year-old patient lying in the bed with no acute distress.  EYES: Pupils equal, round, reactive to light and accommodation. No scleral icterus. Extraocular muscles intact.  HEENT: Head atraumatic, normocephalic. Oropharynx and nasopharynx clear.  NECK:  Supple, no jugular venous distention. No thyroid enlargement, no tenderness.  LUNGS: Normal breath sounds bilaterally, no wheezing, rales,rhonchi or crepitation. No use of accessory muscles of respiration.  CARDIOVASCULAR: S1, S2 normal. No murmurs, rubs, or gallops. All pulses equal throughout upper and lower extremities ABDOMEN: Soft, slight lower abdominal tenderness, nondistended. Bowel sounds present. No organomegaly or mass.  EXTREMITIES: No pedal edema, cyanosis, or clubbing.  NEUROLOGIC: Cranial nerves II through XII are intact. Muscle strength 5/5 in all extremities. Sensation intact. Gait not checked.  PSYCHIATRIC: The patient is alert and oriented x 3.  SKIN: No rash, lesion, or ulcer.   LABORATORY PANEL:   CBC  Recent Labs Lab 05/19/15 1612  WBC 7.9  HGB 15.6  HCT 46.9  PLT 261   ------------------------------------------------------------------------------------------------------------------  Chemistries   Recent Labs Lab 05/19/15 1612  NA 138  K 4.0  CL 101  CO2 28  GLUCOSE 88  BUN 17  CREATININE 1.01  CALCIUM 9.1   ------------------------------------------------------------------------------------------------------------------  Cardiac Enzymes  Recent Labs Lab 05/19/15 1612  TROPONINI <0.03   ------------------------------------------------------------------------------------------------------------------  RADIOLOGY:  Dg Chest 2 View  05/19/2015  CLINICAL DATA:  Hypertension, chest pain, generalized weakness EXAM: CHEST  2 VIEW COMPARISON:  08/11/2010 FINDINGS: Cardiomediastinal silhouette is stable. No  acute infiltrate or pleural effusion. No pulmonary edema. Bony thorax is unremarkable. IMPRESSION: No active cardiopulmonary disease. Electronically Signed   By: Lahoma Crocker M.D.   On: 05/19/2015 17:05    EKG:   Normal sinus rhythm 85 bpm, biatrial enlargement, LVH, ST depression laterally  IMPRESSION AND PLAN:   1. Accelerated hypertension. The patient's blood pressures probably been up for days now. I do not want to lower the blood pressure immediately. I will restart his usual lisinopril and hydrochlorothiazide and give them now. I will also add clonidine. I will not give IV medication for blood pressure. I am okay with the blood pressure being elevated tonight. We'll try to get better blood pressure control tomorrow. 2. Joint pain all over. Check an ESR, ANA, rheumatoid factor and uric acid 3. Anxiety and depression. Patient also has suicidal ideation but states he won't do anything about it. I will get a psychiatric consultation. Restart Prozac 4. Abnormal EKG with ST depression laterally, on and off chest pain. This is likely secondary to accelerated hypertension. Get cardiology consultation tomorrow and serial cardiac enzymes and an echocardiogram. 5. Tobacco abuse smoking cessation counseling done 3 minutes by me and nicotine patch applied. 6. Gastroesophageal reflux disease continue Pepcid  while here 7. Send off a urine toxicology  All the records are reviewed and case discussed with ED provider. Management plans discussed with the patient, and he is in agreement.  CODE STATUS: Full  TOTAL TIME TAKING CARE OF THIS PATIENT: 50 minutes.    Loletha Grayer M.D on 05/19/2015 at 6:35 PM  Between 7am to 6pm - Pager - [phone]  After 6pm call admission pager [phone]  [facility]  [phone]  CC: Primary care physician; Lavera Guise, MD

## 2015-05-19 NOTE — ED Notes (Signed)
Pt sent by PCP today for hypertension a, chest pain and generalized weakness, pt reports " i just dont feel well'

## 2015-05-19 NOTE — Progress Notes (Addendum)
Informed Dr. Elpidio Anissudini about patient's possible alcohol withdrawal. Patient is currently agitated, and stated that he does drink but does remember when his last alcohol intake was. New order: ciwa placed

## 2015-05-19 NOTE — ED Notes (Signed)
Patient transported to X-ray 

## 2015-05-19 NOTE — ED Provider Notes (Signed)
[facility] Emergency Department Provider Note  ____________________________________________  Time seen: Approximately 4:24 PM  I have reviewed the triage vital signs and the nursing notes.   HISTORY  Chief Complaint Chest Pain and Hypertension    HPI Gerald Martin is a 50 y.o. male with a history of HTN, HL, COPD and ongoing tobacco abuse, depression presenting from urgent care for hypertension, chest pain and depression. The patient reports that he has been out of his medications for several days due to inability to afford his medications. Since then he has been "feeling bad." He describes that for 2 months he has been having intermittent chest pains that he describes as an ache in the left side of the chest and up to the collarbone that is otherwise nonradiating, not associated with diaphoresis, nausea or vomiting. The chest pains are not related to exertion or food. He is not having pain at this time. He also reports that he has been feeling increasingly depressed but denies any suicidal ideations, homicidal ideations, or hallucinations at this time.The patient has never had a cardiac stress test nor cardiac catheterization.   Past Medical History  Diagnosis Date  . Chronic pain     back  . Hypertension   . Hyperlipidemia   . GERD (gastroesophageal reflux disease)   . Anxiety   . Depression   . COPD (chronic obstructive pulmonary disease) [facility])     Patient Active Problem List   Diagnosis Date Noted  . Calculus of gallbladder with other cholecystitis, without mention of obstruction 10/07/2012  . Gallstones 09/17/2012    Past Surgical History  Procedure Laterality Date  . Arm surgery[other] Left     chain saw injury  . Leg surgery Right   . Chronic pain      Current Outpatient Rx  Name  Route  Sig  Dispense  Refill  . aspirin 81 MG tablet   Oral   Take 81 mg by mouth daily.         . cyclobenzaprine (FLEXERIL) 10 MG tablet   Oral   Take  10 mg by mouth daily.         Marland Kitchen FLUoxetine (PROZAC) 20 MG capsule   Oral   Take 60 mg by mouth daily.         . hydrochlorothiazide (HYDRODIURIL) 12.5 MG tablet   Oral   Take 12.5 mg by mouth daily.         Marland Kitchen HYDROcodone-acetaminophen (NORCO/VICODIN) 5-325 MG per tablet   Oral   Take 1 tablet by mouth every 8 (eight) hours as needed for pain.         Marland Kitchen lisinopril (PRINIVIL,ZESTRIL) 20 MG tablet   Oral   Take 20 mg by mouth daily.         . naproxen sodium (ANAPROX) 550 MG tablet   Oral   Take 550 mg by mouth 2 (two) times daily with a meal.         . ranitidine (ZANTAC) 150 MG tablet   Oral   Take 150 mg by mouth 2 (two) times daily.         Marland Kitchen tiotropium (SPIRIVA) 18 MCG inhalation capsule   Inhalation   Place 18 mcg into inhaler and inhale daily.         . traMADol (ULTRAM) 50 MG tablet   Oral   Take 50 mg by mouth every 6 (six) hours as needed for pain.  Allergies Review of patient's allergies indicates no known allergies.  Family History  Problem Relation Age of Onset  . Diabetes Father   . Stroke Mother     Social History Social History  Substance Use Topics  . Smoking status: Current Every Day Smoker -- 2.00 packs/day for 30 years  . Smokeless tobacco: None  . Alcohol Use: Yes    Review of Systems Constitutional: No fever/chills.No lightheadedness or syncope. Eyes: No visual changes. No blurred or double vision. ENT: No sore throat. No congestion or rhinorrhea. Cardiovascular: Positive chest pain. Denies palpitations. Respiratory: Denies shortness of breath.  Positive chronic unchanged cough. Gastrointestinal: No abdominal pain.  No nausea, no vomiting.  No diarrhea.  No constipation. Genitourinary: Negative for dysuria. Musculoskeletal: Negative for back pain. Skin: Negative for rash. Neurological: Negative for headaches. No focal numbness, tingling or weakness.  Psychiatric:Positive depressed symptoms without SI, HI or  hallucinations. 10-point ROS otherwise negative.  ____________________________________________   PHYSICAL EXAM:  VITAL SIGNS: ED Triage Vitals  Enc Vitals Group     BP 05/19/15 1551 194/124 mmHg     Pulse Rate 05/19/15 1551 80     Resp 05/19/15 1551 20     Temp 05/19/15 1551 98.1 F (36.7 C)     Temp Source 05/19/15 1551 Oral     SpO2 05/19/15 1551 97 %     Weight 05/19/15 1551 170 lb (77.111 kg)     Height 05/19/15 1551 6\' 4"  (1.93 m)     Head Cir --      Peak Flow --      Pain Score --      Pain Loc --      Pain Edu? --      Excl. in GC? --     Constitutional: Patient is alert and oriented and answering questions appropriately. He has a flat and depressed affect and does not make good eye contact. He is exhibiting normal judgment is able to participate in my examination as well as my physical examination. Eyes: Conjunctivae are normal.  EOMI. No scleral icterus. Head: Atraumatic. Nose: No congestion/rhinnorhea. Mouth/Throat: Mucous membranes are dry.  Neck: No stridor.  Supple.  No JVD. No meningismus. Cardiovascular: Normal rate, regular rhythm. No murmurs, rubs or gallops.  Respiratory: Normal respiratory effort.  No accessory muscle use or retractions. Lungs CTAB.  No wheezes, rales or ronchi. Gastrointestinal: Soft, nontender and nondistended.  No guarding or rebound.  No peritoneal signs. Musculoskeletal: No LE edema. No ttp in the calves or palpable cords.  Negative Homan's sign. Neurologic:  A&Ox3.  Speech is clear.  Face and smile are symmetric.  EOMI.  Moves all extremities well. Skin:  Skin is warm, dry and intact. No rash noted. Psychiatric: Depressed mood and flat affect. Speech and behavior are normal.  Normal judgement.  ____________________________________________   LABS (all labs ordered are listed, but only abnormal results are displayed)  Labs Reviewed  CBC - Abnormal; Notable for the following:    RDW 15.7 (*)    All other components within  normal limits  BASIC METABOLIC PANEL  TROPONIN I  URINALYSIS COMPLETEWITH MICROSCOPIC ([facility] ONLY)   ____________________________________________  EKG  ED ECG REPORT I, Rockne MenghiniNorman, Anne-Caroline, the attending physician, personally viewed and interpreted this ECG.   Date: 05/19/2015  EKG Time: 1552  Rate: 85  Rhythm: normal sinus rhythm  Axis: Normal  Intervals:Prolonged QTC  ST&T Change: No ST elevation.  0.945mm ST depression V5 and V6  EKG compared 9/14 and is  grossly unchanged except for ST morphology in V5 and V6.  ____________________________________________  RADIOLOGY  Dg Chest 2 View  05/19/2015  CLINICAL DATA:  Hypertension, chest pain, generalized weakness EXAM: CHEST  2 VIEW COMPARISON:  08/11/2010 FINDINGS: Cardiomediastinal silhouette is stable. No acute infiltrate or pleural effusion. No pulmonary edema. Bony thorax is unremarkable. IMPRESSION: No active cardiopulmonary disease. Electronically Signed   By: Natasha Mead M.D.   On: 05/19/2015 17:05    ____________________________________________   PROCEDURES  Procedure(s) performed: None  Critical Care performed: No ____________________________________________   INITIAL IMPRESSION / ASSESSMENT AND PLAN / ED COURSE  Pertinent labs & imaging results that were available during my care of the patient were reviewed by me and considered in my medical decision making (see chart for details).  50 y.o. with HTN, HL, COPD and ongoing tobacco abuse presenting with 2 months of intermittent chest pain, hypertension and depression in the setting of not taking his medication for several days. Overall, the patient is not in any acute distress. He is markedly hypertensive at 194/124 in the emergency department. I will plan to evaluate him for ACS or MI although is possible that his chest pain is related to stable angina or it may be unrelated to cardiac cause. Given his risk factors and previous lack of risk stratification, I will  talk to the patient about admission for stress test. At this time, the patient is exhibiting signs and symptoms of depression but has no red flag symptoms. He will need to be restarted on his antidepressants with his primary care physician or his psychiatrist.  ----------------------------------------- 5:46 PM on 05/19/2015 -----------------------------------------  The patient's workup here is reassuring. His troponin is negative, his chest x-ray does not show any acute cardiopulmonary process, and he has been chest pain-free. He continues to be hypertensive but his blood pressures trending in the right direction and is currently 170s over 100s. I will give him some more antihypertensive, and admit him to the hospital for further evaluation and treatment. ____________________________________________  FINAL CLINICAL IMPRESSION(S) / ED DIAGNOSES  Final diagnoses:  Chest pain, unspecified chest pain type  Essential hypertension  Depression      NEW MEDICATIONS STARTED DURING THIS VISIT:  New Prescriptions   No medications on file     Rockne Menghini, MD 05/19/15 1747

## 2015-05-20 ENCOUNTER — Inpatient Hospital Stay
Admit: 2015-05-20 | Discharge: 2015-05-20 | Disposition: A | Discharge status: Home / Self Care | Payer: Medicare Other | Attending: Internal Medicine | Admitting: Internal Medicine

## 2015-05-20 DIAGNOSIS — F332 Major depressive disorder, recurrent severe without psychotic features: Secondary | ICD-10-CM

## 2015-05-20 DIAGNOSIS — Z9119 Patient's noncompliance with other medical treatment and regimen: Secondary | ICD-10-CM

## 2015-05-20 DIAGNOSIS — Z91199 Patient's noncompliance with other medical treatment and regimen due to unspecified reason: Secondary | ICD-10-CM

## 2015-05-20 DIAGNOSIS — F4321 Adjustment disorder with depressed mood: Secondary | ICD-10-CM

## 2015-05-20 LAB — CBC
HCT: 44.2 % (ref 40.0–52.0)
Hemoglobin: 14.7 g/dL (ref 13.0–18.0)
MCH: 29.2 pg (ref 26.0–34.0)
MCHC: 33.3 g/dL (ref 32.0–36.0)
MCV: 87.8 fL (ref 80.0–100.0)
PLATELETS: 253 10*3/uL (ref 150–440)
RBC: 5.03 MIL/uL (ref 4.40–5.90)
RDW: 15.8 % — AB (ref 11.5–14.5)
WBC: 7.1 10*3/uL (ref 3.8–10.6)

## 2015-05-20 LAB — BASIC METABOLIC PANEL
Anion gap: 8 (ref 5–15)
BUN: 20 mg/dL (ref 6–20)
CHLORIDE: 105 mmol/L (ref 101–111)
CO2: 26 mmol/L (ref 22–32)
CREATININE: 1.01 mg/dL (ref 0.61–1.24)
Calcium: 8.8 mg/dL — ABNORMAL LOW (ref 8.9–10.3)
GFR calc Af Amer: >60 mL/min (ref >60)
GLUCOSE: 99 mg/dL (ref 65–99)
POTASSIUM: 3.8 mmol/L (ref 3.5–5.1)
SODIUM: 139 mmol/L (ref 135–145)

## 2015-05-20 LAB — RHEUMATOID FACTOR: RHEUMATOID FACTOR: 30.3 [IU]/mL — AB (ref 0.0–13.9)

## 2015-05-20 MED ORDER — LORAZEPAM 2 MG/ML IJ SOLN: 1.0000 mg | Freq: Four times a day (QID) | INTRAMUSCULAR | Status: DC | PRN

## 2015-05-20 MED ORDER — LORAZEPAM 1 MG PO TABS: 1.0000 mg | ORAL_TABLET | Freq: Four times a day (QID) | ORAL | Status: DC | PRN

## 2015-05-20 MED ORDER — ENOXAPARIN SODIUM 40 MG/0.4ML ~~LOC~~ SOLN
40.0000 mg | SUBCUTANEOUS | Status: DC
  Administered 2015-05-20: 40 mg via SUBCUTANEOUS
  Filled 2015-05-20: qty 0.4

## 2015-05-20 MED ORDER — ADULT MULTIVITAMIN W/MINERALS CH
1.0000 | ORAL_TABLET | Freq: Every day | ORAL | Status: DC
  Administered 2015-05-20: 1 via ORAL
  Filled 2015-05-20: qty 1

## 2015-05-20 MED ORDER — FOLIC ACID 1 MG PO TABS
1.0000 mg | ORAL_TABLET | Freq: Every day | ORAL | Status: DC
  Administered 2015-05-20 – 2015-05-21 (×2): 1 mg via ORAL
  Filled 2015-05-20 (×2): qty 1

## 2015-05-20 NOTE — Plan of Care (Signed)
Problem: Education: Goal: Knowledge of [facility] Education information/materials will improve Outcome: Progressing Education handouts was provided to the patient.  Problem: Safety: Goal: Ability to remain free from injury will improve Outcome: Progressing Patient is a high fall risk. Patient was very unsteady when ambulating to bed.

## 2015-05-20 NOTE — Progress Notes (Signed)
Patient has bottle of tablets with him. Reports he does not know what they are and label on bottle has been torn off. Stored in pharmacy per protocol. Will have to be retrieved from there prior to discharge.

## 2015-05-20 NOTE — Plan of Care (Signed)
Problem: Health Behavior/Discharge Planning: Goal: Ability to manage health-related needs will improve Outcome: Not Met (add Reason) Patient stop taking his BP medication. When asked why, patient shrugged his shoulders and stated he did not know.

## 2015-05-20 NOTE — Care Management (Signed)
CM consult for medication needs. Reviewed chart. He lives at home alone. He states he uses a company in [place] that manages his money but he is unable to recall the name. He makes approximately $900 per month. States he can get his medications (he has medicaid) but he just hasn't and he doesn't know why. I explained to him that all his medications are covered under his Medicaid and I could not get them any cheaper. He has problem with tranportation. I referred him to [facility]. He replied" I don't know how to do that. " Poor eye contact during the entire conversation. He was restless, fidgeting in bed and moving his feet up and down with the controls. He kept asking, "Why do you want to know"," Can I go home now?" even after I explained my role multiple times. CM provided patient with all the information for assistance that was available. No further needs at this time.

## 2015-05-20 NOTE — Consult Note (Signed)
[facility] Face-to-Face Psychiatry Consult   Reason for Consult:  Consult for this 50 year old man admitted to the hospital because of out-of-control blood pressure. Concern about depression Referring Physician:  Gouru Patient Identification: Gerald Martin MRN:  [id number] Principal Diagnosis: Severe recurrent major depression without psychotic features [facility]) Diagnosis:   Patient Active Problem List   Diagnosis Date Noted  . Severe recurrent major depression without psychotic features ([place]) [F33.2] 05/20/2015  . Noncompliance [Z91.19] 05/20/2015  . Grief [F43.21] 05/20/2015  . Accelerated hypertension [I10] 05/19/2015  . Calculus of gallbladder with other cholecystitis, without mention of obstruction [K80.10] 10/07/2012  . Gallstones [K80.20] 09/17/2012    Total Time spent with patient: 1 hour  Subjective:   Gerald Martin is a 50 y.o. male patient admitted with "I should never dadgum said anything".   HPI:  Patient interviewed. Chart reviewed including notes from other facilities. Labs and vitals reviewed. This 50 year old man came to the emergency room at the recommendation of his primary care doctor cause of out-of-control blood pressure. Concern is also raised about depression. Patient was difficult to interview. He is largely uncooperative and his insight is minimal and his motivation is minimal. Much of the time when I ask questions he would tell me that he didn't want to talk about it or would become evasive. A lot of information came from reading a little between the lines of what he said. Patient does indicate that he is feeling bad. He said several times that he feels like his life has gone to hell and that everything is terrible and that no one is helping him. Indicated a general feeling of helplessness. Seems to feel that his life is out of control and he has no idea how to fix it. Apparently his girlfriend, with whom he was living, died some time ago possibly as much as a year ago and  since then his functioning has gotten worse. He has not been able to keep up financially and doesn't have any skill managing his own money. Probably has been noncompliant with medical care. He indicated that he hasn't been taking his blood pressure medicine and he admitted that he has been off of his antidepressants for at least a couple weeks. He is sleeping poorly at night. Appetite hasn't been so good although he also has not been motivated to get himself any food. He denies having any psychotic symptoms. Although at first he was a little evasive he eventually stated that he had no actual thoughts about killing himself and did not want to die. Denied any thoughts of hurting anyone else. He admitted that he drinks a little bit of beer but would not be any more specific than that would not discuss whether or not his drinking and been a problem.  Social history: Living in a trailer. Used to share with his girlfriend. Ever since she died he has had other roommates but they don't keep up with paying the bills. He says that he needs to get out of there and find a new place to live. Wouldn't discuss it in any more detail than that. He says he does have siblings but he doesn't seem to be willing to get any help from them.  Medical history: What I find from the old chart is the has a history of having his gallbladder removed and has had some pain problems in the past. He has very high blood pressure but is not taking care of it. Apparently he is disabled from his chronic  depression. He won't give me much more detail about that either. Denies knowing of any history of heart attack or stroke.  Substance abuse history: Nothing in the chart about this. He says that he drinks a little bit of beer. Drug screen negative alcohol level was not checked as far as I can tell.  Past Psychiatric History: Patient admits that he's been given a diagnosis of depression and that he has been prescribed medicine in the past. He only  admitted to being prescribed Prozac when I came back a second time and confronted him with what I have learned from his chart. He tells me that he's never been in a psychiatric hospital and I don't see any documentation to contradict that. Denies ever trying to kill himself in the past. Denies psychotic symptoms. He doesn't know or won't tell me about any other medicine he has been prescribed.  Risk to Self: Is patient at risk for suicide?: No Risk to Others:   Prior Inpatient Therapy:   Prior Outpatient Therapy:    Past Medical History:  Past Medical History  Diagnosis Date  . Chronic pain     back  . Hypertension   . Hyperlipidemia   . GERD (gastroesophageal reflux disease)   . Anxiety   . Depression   . COPD (chronic obstructive pulmonary disease) [facility])     Past Surgical History  Procedure Laterality Date  . Arm surgery[other] Left     chain saw injury  . Leg surgery Right   . Chronic pain     Family History:  Family History  Problem Relation Age of Onset  . Diabetes Father   . Stroke Mother    Family Psychiatric  History: Patient declines to discuss anything about family history of mental illness or substance abuse Social History:  History  Alcohol Use  . Yes     History  Drug Use No    Social History   Social History  . Marital Status: Married    Spouse Name: N/A  . Number of Children: N/A  . Years of Education: N/A   Social History Main Topics  . Smoking status: Current Every Day Smoker -- 2.00 packs/day for 30 years  . Smokeless tobacco: None  . Alcohol Use: Yes  . Drug Use: No  . Sexual Activity: Not Asked   Other Topics Concern  . None   Social History Narrative   Additional Social History:    Allergies:  No Known Allergies  Labs:  Results for orders placed or performed during the hospital encounter of 05/19/15 (from the past 48 hour(s))  Basic metabolic panel     Status: None   Collection Time: 05/19/15  4:12 PM  Result Value Ref Range    Sodium 138 135 - 145 mmol/L   Potassium 4.0 3.5 - 5.1 mmol/L   Chloride 101 101 - 111 mmol/L   CO2 28 22 - 32 mmol/L   Glucose, Bld 88 65 - 99 mg/dL   BUN 17 6 - 20 mg/dL   Creatinine, Ser 1.01 0.61 - 1.24 mg/dL   Calcium 9.1 8.9 - 10.3 mg/dL   GFR calc non Af Amer >60 >60 mL/min   GFR calc Af Amer >60 >60 mL/min    Comment: (NOTE) The eGFR has been calculated using the CKD EPI equation. This calculation has not been validated in all clinical situations. eGFR's persistently <60 mL/min signify possible Chronic Kidney Disease.    Anion gap 9 5 - 15  CBC  Status: Abnormal   Collection Time: 05/19/15  4:12 PM  Result Value Ref Range   WBC 7.9 3.8 - 10.6 K/uL   RBC 5.29 4.40 - 5.90 MIL/uL   Hemoglobin 15.6 13.0 - 18.0 g/dL   HCT 46.9 40.0 - 52.0 %   MCV 88.7 80.0 - 100.0 fL   MCH 29.5 26.0 - 34.0 pg   MCHC 33.3 32.0 - 36.0 g/dL   RDW 15.7 (H) 11.5 - 14.5 %   Platelets 261 150 - 440 K/uL  Troponin I     Status: None   Collection Time: 05/19/15  4:12 PM  Result Value Ref Range   Troponin I <0.03 <0.031 ng/mL    Comment:        NO INDICATION OF MYOCARDIAL INJURY.   Rheumatoid factor     Status: Abnormal   Collection Time: 05/19/15  4:12 PM  Result Value Ref Range   Rhuematoid fact SerPl-aCnc 30.3 (H) 0.0 - 13.9 IU/mL    Comment: (NOTE) Performed At: [facility] [place], [place] [id number] Lindon Romp MD YI:[id number]   Sedimentation rate     Status: None   Collection Time: 05/19/15  4:12 PM  Result Value Ref Range   Sed Rate 4 0 - 15 mm/hr  Uric acid     Status: Abnormal   Collection Time: 05/19/15  4:12 PM  Result Value Ref Range   Uric Acid, Serum 3.9 (L) 4.4 - 7.6 mg/dL  Basic metabolic panel     Status: Abnormal   Collection Time: 05/20/15  4:41 AM  Result Value Ref Range   Sodium 139 135 - 145 mmol/L   Potassium 3.8 3.5 - 5.1 mmol/L   Chloride 105 101 - 111 mmol/L   CO2 26 22 - 32 mmol/L   Glucose, Bld 99 65 - 99 mg/dL    BUN 20 6 - 20 mg/dL   Creatinine, Ser 1.01 0.61 - 1.24 mg/dL   Calcium 8.8 (L) 8.9 - 10.3 mg/dL   GFR calc non Af Amer >60 >60 mL/min   GFR calc Af Amer >60 >60 mL/min    Comment: (NOTE) The eGFR has been calculated using the CKD EPI equation. This calculation has not been validated in all clinical situations. eGFR's persistently <60 mL/min signify possible Chronic Kidney Disease.    Anion gap 8 5 - 15  CBC     Status: Abnormal   Collection Time: 05/20/15  4:41 AM  Result Value Ref Range   WBC 7.1 3.8 - 10.6 K/uL   RBC 5.03 4.40 - 5.90 MIL/uL   Hemoglobin 14.7 13.0 - 18.0 g/dL   HCT 44.2 40.0 - 52.0 %   MCV 87.8 80.0 - 100.0 fL   MCH 29.2 26.0 - 34.0 pg   MCHC 33.3 32.0 - 36.0 g/dL   RDW 15.8 (H) 11.5 - 14.5 %   Platelets 253 150 - 440 K/uL    Current Facility-Administered Medications  Medication Dose Route Frequency Provider Last Rate Last Dose  . acetaminophen (TYLENOL) tablet 650 mg  650 mg Oral Q6H PRN Loletha Grayer, MD   650 mg at 05/19/15 1920   Or  . acetaminophen (TYLENOL) suppository 650 mg  650 mg Rectal Q6H PRN Loletha Grayer, MD      . aspirin EC tablet 81 mg  81 mg Oral Daily Loletha Grayer, MD   81 mg at 05/20/15 1050  . cloNIDine (CATAPRES) tablet 0.2 mg  0.2 mg Oral BID Loletha Grayer, MD  0.2 mg at 05/20/15 1050  . cyclobenzaprine (FLEXERIL) tablet 10 mg  10 mg Oral Daily Loletha Grayer, MD   10 mg at 05/20/15 1050  . enoxaparin (LOVENOX) injection 40 mg  40 mg Subcutaneous Q24H Aruna Gouru, MD      . famotidine (PEPCID) tablet 20 mg  20 mg Oral BID Loletha Grayer, MD   20 mg at 05/20/15 1050  . FLUoxetine (PROZAC) capsule 60 mg  60 mg Oral Daily Loletha Grayer, MD   60 mg at 05/20/15 1050  . folic acid (FOLVITE) tablet 1 mg  1 mg Oral Daily Sylvan Cheese, MD   1 mg at 05/20/15 1050  . hydrochlorothiazide (HYDRODIURIL) tablet 12.5 mg  12.5 mg Oral Daily Loletha Grayer, MD   12.5 mg at 05/20/15 1050  . lisinopril (PRINIVIL,ZESTRIL) tablet 20 mg  20  mg Oral Daily Loletha Grayer, MD   20 mg at 05/20/15 1050  . LORazepam (ATIVAN) tablet 1 mg  1 mg Oral Q6H PRN Sylvan Cheese, MD       Or  . LORazepam (ATIVAN) injection 1 mg  1 mg Intravenous Q6H PRN Sylvan Cheese, MD      . multivitamin with minerals tablet 1 tablet  1 tablet Oral Q supper Sylvan Cheese, MD   1 tablet at 05/20/15 1722  . nicotine (NICODERM CQ - dosed in mg/24 hours) patch 21 mg  21 mg Transdermal Daily Loletha Grayer, MD   21 mg at 05/20/15 1051  . oxyCODONE (Oxy IR/ROXICODONE) immediate release tablet 5 mg  5 mg Oral Q4H PRN Loletha Grayer, MD      . sodium chloride flush (NS) 0.9 % injection 3 mL  3 mL Intravenous Q12H Loletha Grayer, MD   3 mL at 05/20/15 1051  . tiotropium (SPIRIVA) inhalation capsule 18 mcg  18 mcg Inhalation Daily Loletha Grayer, MD   18 mcg at 05/20/15 1050    Musculoskeletal: Strength & Muscle Tone: decreased Gait & Station: normal Patient leans: N/A  Psychiatric Specialty Exam: Review of Systems  Constitutional: Positive for chills.  HENT: Negative.   Eyes: Negative.   Respiratory: Negative.   Cardiovascular: Negative.   Gastrointestinal: Negative.   Musculoskeletal: Positive for myalgias and back pain.  Skin: Negative.   Neurological: Negative.   Psychiatric/Behavioral: Positive for depression and memory loss. Negative for suicidal ideas, hallucinations and substance abuse. The patient is nervous/anxious and has insomnia.     Blood pressure 133/80, pulse 64, temperature 98.2 F (36.8 C), temperature source Oral, resp. rate 21, height '6\' 4"'  (1.93 m), weight 77.111 kg (170 lb), SpO2 98 %.Body mass index is 20.7 kg/(m^2).  General Appearance: Disheveled  Eye Contact::  Minimal  Speech:  Slow and Slurred  Volume:  Decreased  Mood:  Anxious, Depressed and Dysphoric  Affect:  Constricted and Depressed  Thought Process:  Tangential  Orientation:  Full (Time, Place, and Person)  Thought Content:  Rumination  Suicidal Thoughts:   No  Homicidal Thoughts:  No  Memory:  Immediate;   Fair Recent;   Poor Remote;   Fair  Judgement:  Fair  Insight:  Fair  Psychomotor Activity:  Decreased  Concentration:  Fair  Recall:  [employer] of Knowledge:Fair  Language: Fair  Akathisia:  No  Handed:  Right  AIMS (if indicated):     Assets:  Financial Resources/Insurance  ADL's:  Impaired  Cognition: Impaired,  Mild  Sleep:      Treatment Plan Summary: Medication management and Plan 50 year old man who  obviously is distressed but is difficult to evaluate because of his lack of cooperation. Carries a previous diagnosis of depression that has been disabling. I don't see any evidence of past suicide attempts or past hospitalization but can't rule it out. It looks like his doctor has been prescribing him fluoxetine 60 mg a day. He said that he thinks that maybe it had been a little bit helpful but wouldn't give me any more detail than that it didn't remember any other medicine. He also admitted that he had been noncompliant with medicine recently. Based on this my suggestion is to continue the fluoxetine 60 mg a day. The biggest thing that would help him would be a few would get involved with any kind of outpatient mental health treatment. He tells me that he was referred to go to [place] and he went there and started filling out the paperwork but then left because he didn't like being around people. I tried to impress upon him the potential benefit of actually going and getting some help. He needs to be referred back to [facility] or for outpatient mental health when he leaves here. I'm not going to start any new medicines since we don't know at this point how helpful his current medicine would be if he took it. I tried to do what I could to educate and encourage him. No evidence to support commitment. No admission to psychiatric hospital at this time. I will follow-up as possible if he stays here in the hospital.  Disposition: Supportive  therapy provided about ongoing stressors. Discussed crisis plan, support from social network, calling 911, coming to the Emergency Department, and calling Suicide Hotline.  Alethia Berthold, MD 05/20/2015 5:28 PM

## 2015-05-20 NOTE — Progress Notes (Signed)
*  PRELIMINARY RESULTS* Echocardiogram 2D Echocardiogram has been performed.  Georgann HousekeeperJerry R Hege 05/20/2015, 10:23 AM

## 2015-05-20 NOTE — Progress Notes (Signed)
[place] [facility] [place] NAME: Gerald Martin    MR#:  [id number]  DATE OF BIRTH:  05-10-1965  SUBJECTIVE:  CHIEF COMPLAINT:  Patient is resting comfortably. Denies any chest pain but reporting back pain which is chronic in nature and asking for pain medications. Denies any suicidal ideations. Not maintaining eye contact  REVIEW OF SYSTEMS:  CONSTITUTIONAL: No fever, fatigue or weakness.  EYES: No blurred or double vision.  EARS, NOSE, AND THROAT: No tinnitus or ear pain.  RESPIRATORY: No cough, shortness of breath, wheezing or hemoptysis.  CARDIOVASCULAR: No chest pain, orthopnea, edema.  GASTROINTESTINAL: No nausea, vomiting, diarrhea or abdominal pain.  GENITOURINARY: No dysuria, hematuria.  ENDOCRINE: No polyuria, nocturia,  HEMATOLOGY: No anemia, easy bruising or bleeding SKIN: No rash or lesion. MUSCULOSKELETAL:Reporting chronic back pain. No joint pain or arthritis.   NEUROLOGIC: No tingling, numbness, weakness.  PSYCHIATRY: No anxiety or depression.   DRUG ALLERGIES:  No Known Allergies  VITALS:  Blood pressure 133/80, pulse 64, temperature 98.2 F (36.8 C), temperature source Oral, resp. rate 21, height _0  (1.93 m), weight 77.111 kg (170 lb), SpO2 98 %.  PHYSICAL EXAMINATION:  GENERAL:  50 y.o.-year-old patient lying in the bed with no acute distress.  EYES: Pupils equal, round, reactive to light and accommodation. No scleral icterus. Extraocular muscles intact.  HEENT: Head atraumatic, normocephalic. Oropharynx and nasopharynx clear.  NECK:  Supple, no jugular venous distention. No thyroid enlargement, no tenderness.  LUNGS: Normal breath sounds bilaterally, no wheezing, rales,rhonchi or crepitation. No use of accessory muscles of respiration.  CARDIOVASCULAR: S1, S2 normal. No murmurs, rubs, or gallops.  ABDOMEN: Soft, nontender, nondistended. Bowel sounds present. No organomegaly or mass. No flank tenderness.   EXTREMITIES: No pedal edema, cyanosis, or clubbing.  NEUROLOGIC: Cranial nerves II through XII are intact. Muscle strength 5/5 in all extremities. Sensation intact. Gait not checked.  PSYCHIATRIC: The patient is alert and oriented x 3.  SKIN: No obvious rash, lesion, or ulcer.    LABORATORY PANEL:   CBC  Recent Labs Lab 05/20/15 0441  WBC 7.1  HGB 14.7  HCT 44.2  PLT 253   ------------------------------------------------------------------------------------------------------------------  Chemistries   Recent Labs Lab 05/20/15 0441  NA 139  K 3.8  CL 105  CO2 26  GLUCOSE 99  BUN 20  CREATININE 1.01  CALCIUM 8.8*   ------------------------------------------------------------------------------------------------------------------  Cardiac Enzymes  Recent Labs Lab 05/19/15 1612  TROPONINI <0.03   ------------------------------------------------------------------------------------------------------------------  RADIOLOGY:  Dg Chest 2 View  05/19/2015  CLINICAL DATA:  Hypertension, chest pain, generalized weakness EXAM: CHEST  2 VIEW COMPARISON:  08/11/2010 FINDINGS: Cardiomediastinal silhouette is stable. No acute infiltrate or pleural effusion. No pulmonary edema. Bony thorax is unremarkable. IMPRESSION: No active cardiopulmonary disease. Electronically Signed   By: Lahoma Crocker M.D.   On: 05/19/2015 17:05    EKG:   Orders placed or performed during the hospital encounter of 05/19/15  . EKG 12-Lead  . EKG 12-Lead  . ED EKG within 10 minutes  . ED EKG within 10 minutes    ASSESSMENT AND PLAN:   1. Accelerated hypertension. The patient's blood pressure is much better . I will continue  his   home meds lisinopril and hydrochlorothiazide. I will also continue  newly added clonidine.   2. Generalized joint pains.  denies any joint pains today. Provide pain management as needed  Check ESR, ANA, rheumatoid factor30.3 and uric acid is 3.9 Consult physical  therapy  3. Anxiety and depression. Patient denies  suicidal ideation today ,pending  psychiatric consultation. Restarted Prozac  4. Abnormal EKG with ST depression laterally, on and off chest pain. This is likely secondary to accelerated hypertension which is improved now . Pending cardiology consultation tomorrow   serial cardiac enzymes-negative. Ruled out acute MI    echocardiogram performed report is pending   5. Tobacco abuse smoking cessation counseling  provided and nicotine patch applied.  6. Gastroesophageal reflux disease continue Pepcid   7. Send off a urine toxicology-still pending      All the records are reviewed and case discussed with Care Management/Social Workerr. Management plans discussed with the patient, family and they are in agreement.  CODE STATUS: FC  TOTAL TIME TAKING CARE OF THIS PATIENT: 36 minutes.   POSSIBLE D/C IN 2 DAYS, DEPENDING ON CLINICAL CONDITION.   Nicholes Mango M.D on 05/20/2015 at 1:14 PM  Between 7am to 6pm - Pager - [phone] After 6pm go to [email or web address] - password [email or web address] [facility]  Office  [phone]  CC: Primary care physician; Lavera Guise, MD

## 2015-05-20 NOTE — Consult Note (Signed)
[facility]  CARDIOLOGY CONSULT NOTE  Patient ID: ANDRIK SANDT MRN: [id number] DOB/AGE: Jun 07, 1965 50 y.o.  Admit date: 05/19/2015 Referring Physician Gouru Primary Physician  Primary Cardiologist  Reason for Consultation chest pain  HPI:  50 year old gentleman referred for evaluation chest pain. The patient has a history of essential hypertension but has not been taking his medications. He was seen at [facility] emergency room on 05/19/2015 because he was not feeling well. The patient was noted to be markedly hypertensive. Admission labs were notable for negative troponin. EKG did reveal nonspecific ST abnormalities laterally in the absence of chest pain. The patient currently denies chest pain. The patient wishes to go home.   Review of systems complete and found to be negative unless listed above     Past Medical History  Diagnosis Date  . Chronic pain     back  . Hypertension   . Hyperlipidemia   . GERD (gastroesophageal reflux disease)   . Anxiety   . Depression   . COPD (chronic obstructive pulmonary disease) [facility])     Past Surgical History  Procedure Laterality Date  . Arm surgery[other] Left     chain saw injury  . Leg surgery Right   . Chronic pain      Prescriptions prior to admission  Medication Sig Dispense Refill Last Dose  . carvedilol (COREG) 12.5 MG tablet Take 12.5 mg by mouth 2 (two) times daily with a meal.   unknown at unknown   . FLUoxetine (PROZAC) 20 MG capsule Take 60 mg by mouth daily.   unknown at unknown   . Ipratropium-Albuterol (COMBIVENT RESPIMAT) 20-100 MCG/ACT AERS respimat Inhale 1 puff into the lungs 4 (four) times daily.   unknown at unknown   . lisinopril-hydrochlorothiazide (PRINZIDE,ZESTORETIC) 20-12.5 MG tablet Take 2 tablets by mouth daily.   unknown at unknown   . ranitidine (ZANTAC) 150 MG tablet Take 150 mg by mouth 2 (two) times daily.   unknown at unknown   . tiotropium (SPIRIVA) 18 MCG inhalation capsule Place 18 mcg into inhaler and  inhale daily.   unknown at unknown   . traMADol (ULTRAM) 50 MG tablet Take 50 mg by mouth 3 (three) times daily.    unknown at unknown   . traZODone (DESYREL) 50 MG tablet Take 50 mg by mouth at bedtime as needed for sleep.   PRN at PRN   Social History   Social History  . Marital Status: Married    Spouse Name: N/A  . Number of Children: N/A  . Years of Education: N/A   Occupational History  . Not on file.   Social History Main Topics  . Smoking status: Current Every Day Smoker -- 2.00 packs/day for 30 years  . Smokeless tobacco: Not on file  . Alcohol Use: Yes  . Drug Use: No  . Sexual Activity: Not on file   Other Topics Concern  . Not on file   Social History Narrative    Family History  Problem Relation Age of Onset  . Diabetes Father   . Stroke Mother       Review of systems complete and found to be negative unless listed above      PHYSICAL EXAM  General: Well developed, well nourished, in no acute distress HEENT:  Normocephalic and atramatic Neck:  No JVD.  Lungs: Clear bilaterally to auscultation and percussion. Heart: HRRR . Normal S1 and S2 without gallops or murmurs.  Abdomen: Bowel sounds are positive, abdomen soft and non-tender  Msk:  Back normal, normal gait. Normal strength and tone for age. Extremities: No clubbing, cyanosis or edema.   Neuro: Alert and oriented X 3. Psych:  Good affect, responds appropriately  Labs:   Lab Results  Component Value Date   WBC 7.1 05/20/2015   HGB 14.7 05/20/2015   HCT 44.2 05/20/2015   MCV 87.8 05/20/2015   PLT 253 05/20/2015    Recent Labs Lab 05/20/15 0441  NA 139  K 3.8  CL 105  CO2 26  BUN 20  CREATININE 1.01  CALCIUM 8.8*  GLUCOSE 99   Lab Results  Component Value Date   TROPONINI <0.03 05/19/2015   No results found for: CHOL No results found for: HDL No results found for: LDLCALC No results found for: TRIG No results found for: CHOLHDL No results found for: LDLDIRECT     Radiology: Dg Chest 2 View  05/19/2015  CLINICAL DATA:  Hypertension, chest pain, generalized weakness EXAM: CHEST  2 VIEW COMPARISON:  08/11/2010 FINDINGS: Cardiomediastinal silhouette is stable. No acute infiltrate or pleural effusion. No pulmonary edema. Bony thorax is unremarkable. IMPRESSION: No active cardiopulmonary disease. Electronically Signed   By: Natasha MeadLiviu  Pop M.D.   On: 05/19/2015 17:05    EKG: Normal sinus rhythm, nonspecific ST abnormalities laterally  ASSESSMENT AND PLAN:   1. Intermittent chest pain, abnormal ECG, with negative troponin. Symptoms likely due to markedly elevated blood pressure due to medical and dietary noncompliance. Troponin negative.  Recommendations  1. Agree with overall current therapy 2. Defer full dose anticoagulation 3. Strongly encouraged patient to be compliant with his medication 4. Review 2-D echocardiogram  Signed: Rizwan Kuyper MD,PhD, [facility] 05/20/2015, 4:54 PM

## 2015-05-21 LAB — ANTINUCLEAR ANTIBODIES, IFA: ANTINUCLEAR ANTIBODIES, IFA: NEGATIVE

## 2015-05-21 LAB — ANA W/REFLEX IF POSITIVE: ANA: NEGATIVE

## 2015-05-21 LAB — ECHOCARDIOGRAM COMPLETE
Height: 76 in
Weight: 2720 oz

## 2015-05-21 LAB — TSH: TSH: 1.165 u[IU]/mL (ref 0.350–4.500)

## 2015-05-21 MED ORDER — NICOTINE 21 MG/24HR TD PT24: 21.0000 mg | MEDICATED_PATCH | Freq: Every day | TRANSDERMAL | Status: DC

## 2015-05-21 MED ORDER — TRAMADOL HCL 50 MG PO TABS: 50.0000 mg | ORAL_TABLET | Freq: Three times a day (TID) | ORAL | Status: DC

## 2015-05-21 MED ORDER — FOLIC ACID 1 MG PO TABS: 1.0000 mg | ORAL_TABLET | Freq: Every day | ORAL | Status: DC

## 2015-05-21 MED ORDER — FLUOXETINE HCL 20 MG PO CAPS: 60.0000 mg | ORAL_CAPSULE | Freq: Every day | ORAL | Status: DC

## 2015-05-21 MED ORDER — LISINOPRIL-HYDROCHLOROTHIAZIDE 20-12.5 MG PO TABS: 2.0000 | ORAL_TABLET | Freq: Every day | ORAL | Status: DC

## 2015-05-21 MED ORDER — ADULT MULTIVITAMIN W/MINERALS CH: 1.0000 | ORAL_TABLET | Freq: Every day | ORAL | Status: DC

## 2015-05-21 MED ORDER — ACETAMINOPHEN 325 MG PO TABS: 650.0000 mg | ORAL_TABLET | Freq: Four times a day (QID) | ORAL | Status: DC | PRN

## 2015-05-21 MED ORDER — CARVEDILOL 12.5 MG PO TABS: 12.5000 mg | ORAL_TABLET | Freq: Two times a day (BID) | ORAL | Status: DC

## 2015-05-21 NOTE — Discharge Summary (Signed)
[facility] NAME: Gerald Martin    MR#:  [id number]  DATE OF BIRTH:  [birth year]  DATE OF ADMISSION:  05/19/2015 ADMITTING PHYSICIAN: Loletha Grayer, MD  DATE OF DISCHARGE: 05/21/15 PRIMARY CARE PHYSICIAN: Lavera Guise, MD    ADMISSION DIAGNOSIS:  Depression [F32.9] Essential hypertension [I10] Chest pain, unspecified chest pain type [R07.9]  DISCHARGE DIAGNOSIS:  Principal Problem:   Severe recurrent major depression without psychotic features ([place]) Active Problems:   Accelerated hypertension   Noncompliance   Grief   SECONDARY DIAGNOSIS:   Past Medical History  Diagnosis Date  . Chronic pain     back  . Hypertension   . Hyperlipidemia   . GERD (gastroesophageal reflux disease)   . Anxiety   . Depression   . COPD (chronic obstructive pulmonary disease) ([place])     HOSPITAL COURSE:  1. Accelerated hypertension. The patient's blood pressure is much better . I will continue his home meds lisinopril and hydrochlorothiazide. I will also continue newly added clonidine.   2. Generalized joint pains. denies any joint pains today. Provide pain management as needed  Check ESR at 4 which is normal, ANA is pending, rheumatoid factor30.3 and uric acid is 3.9 Significantly improved and according to the RN patient is ambulating without any difficulty  3. Anxiety and depression. Patient denies suicidal ideation today ,pending psychiatric consultation. Restarted Prozac,psych is recommending to continue floxetin 60 mg po q daily and outpatient follow-up with [facility] clinic at [facility] or community psych clinic  4. Abnormal EKG with ST depression laterally, on and off chest pain. This is likely secondary to accelerated hypertension which is improved now .  cardiology is recommending echocardiogram and outpatient follow-up serial cardiac enzymes-negative. Ruled out acute MI   echocardiogram performed report is pending  , cardiology to follow-up on the results  5. Tobacco abuse smoking cessation counseling provided and nicotine patch applied.  6. Gastroesophageal reflux disease continue Pepcid   7. Send off a urine toxicology-still pending   Deconditioning with physical therapy identified no PT needs  All the records are reviewed and case discussed with Care Management/Social Workerr. Management plans discussed with the patient, family and they are in agreement.  CODE STATUS: FC  DISCHARGE CONDITIONS:   fair  CONSULTS OBTAINED:  Treatment Team:  Isaias Cowman, MD Gonzella Lex, MD Corey Skains, MD   PROCEDURES none  DRUG ALLERGIES:  No Known Allergies  DISCHARGE MEDICATIONS:   Current Discharge Medication List    START taking these medications   Details  acetaminophen (TYLENOL) 325 MG tablet Take 2 tablets (650 mg total) by mouth every 6 (six) hours as needed for mild pain (or Fever >/= 101).    folic acid (FOLVITE) 1 MG tablet Take 1 tablet (1 mg total) by mouth daily.    Multiple Vitamin (MULTIVITAMIN WITH MINERALS) TABS tablet Take 1 tablet by mouth daily with supper.    nicotine (NICODERM CQ - DOSED IN MG/24 HOURS) 21 mg/24hr patch Place 1 patch (21 mg total) onto the skin daily. Qty: 28 patch, Refills: 0      CONTINUE these medications which have CHANGED   Details  carvedilol (COREG) 12.5 MG tablet Take 1 tablet (12.5 mg total) by mouth 2 (two) times daily with a meal. Qty: 60 tablet, Refills: 0    FLUoxetine (PROZAC) 20 MG capsule Take 3 capsules (60 mg total) by mouth daily. Qty: 30 capsule, Refills: 1    lisinopril-hydrochlorothiazide (  PRINZIDE,ZESTORETIC) 20-12.5 MG tablet Take 2 tablets by mouth daily. Qty: 60 tablet, Refills: 0    traMADol (ULTRAM) 50 MG tablet Take 1 tablet (50 mg total) by mouth 3 (three) times daily. Qty: 30 tablet, Refills: 0      CONTINUE these medications which have NOT CHANGED   Details  Ipratropium-Albuterol (COMBIVENT  RESPIMAT) 20-100 MCG/ACT AERS respimat Inhale 1 puff into the lungs 4 (four) times daily.    ranitidine (ZANTAC) 150 MG tablet Take 150 mg by mouth 2 (two) times daily.    tiotropium (SPIRIVA) 18 MCG inhalation capsule Place 18 mcg into inhaler and inhale daily.    traZODone (DESYREL) 50 MG tablet Take 50 mg by mouth at bedtime as needed for sleep.      STOP taking these medications     cyclobenzaprine (FLEXERIL) 10 MG tablet      hydrochlorothiazide (HYDRODIURIL) 12.5 MG tablet      lisinopril (PRINIVIL,ZESTRIL) 20 MG tablet          DISCHARGE INSTRUCTIONS:   Activity as tolerated Diet low-salt Follow-up with primary care physician or [facility] in one week Follow-up with outpatient psychiatry clinic or psychiatry at [facility] clinic in 2-4 weeks Outpatient follow-up with cardiology Dr. Neldon [place] shows in 2 weeks  DIET:  Low-salt  DISCHARGE CONDITION:  Fair  ACTIVITY:  Activity as tolerated  OXYGEN:  Home Oxygen: No.   Oxygen Delivery: room air  DISCHARGE LOCATION:  home   If you experience worsening of your admission symptoms, develop shortness of breath, life threatening emergency, suicidal or homicidal thoughts you must seek medical attention immediately by calling 911 or calling your MD immediately  if symptoms less severe.  You Must read complete instructions/literature along with all the possible adverse reactions/side effects for all the Medicines you take and that have been prescribed to you. Take any new Medicines after you have completely understood and accpet all the possible adverse reactions/side effects.   Please note  You were cared for by a hospitalist during your hospital stay. If you have any questions about your discharge medications or the care you received while you were in the hospital after you are discharged, you can call the unit and asked to speak with the hospitalist on call if the hospitalist that took care of you is not available. Once  you are discharged, your primary care physician will handle any further medical issues. Please note that NO REFILLS for any discharge medications will be authorized once you are discharged, as it is imperative that you return to your primary care physician (or establish a relationship with a primary care physician if you do not have one) for your aftercare needs so that they can reassess your need for medications and monitor your lab values.     Today  Chief Complaint  Patient presents with  . Chest Pain  . Hypertension   Pt is feeling ok. Feels safe at home. Lives with friends. Wants to go home. Denies any chest pain  ROS:  CONSTITUTIONAL: Denies fevers, chills. Denies any fatigue, weakness.  EYES: Denies blurry vision, double vision, eye pain. EARS, NOSE, THROAT: Denies tinnitus, ear pain, hearing loss. RESPIRATORY: Denies cough, wheeze, shortness of breath.  CARDIOVASCULAR: Denies chest pain, palpitations, edema.  GASTROINTESTINAL: Denies nausea, vomiting, diarrhea, abdominal pain. Denies bright red blood per rectum. GENITOURINARY: Denies dysuria, hematuria. ENDOCRINE: Denies nocturia or thyroid problems. HEMATOLOGIC AND LYMPHATIC: Denies easy bruising or bleeding. SKIN: Denies rash or lesion. MUSCULOSKELETAL: Denies pain in neck, back,  shoulder, knees, hips or arthritic symptoms.  NEUROLOGIC: Denies paralysis, paresthesias.  PSYCHIATRIC: Denies anxiety or depressive symptoms.   VITAL SIGNS:  Blood pressure 136/80, pulse 57, temperature 98.4 F (36.9 C), temperature source Oral, resp. rate 16, height _0  (1.93 m), weight 77.111 kg (170 lb), SpO2 100 %.  I/O:    Intake/Output Summary (Last 24 hours) at 05/21/15 1248 Last data filed at 05/21/15 0900  Gross per 24 hour  Intake    480 ml  Output    400 ml  Net     80 ml    PHYSICAL EXAMINATION:  GENERAL:  50 y.o.-year-old patient lying in the bed with no acute distress.  EYES: Pupils equal, round, reactive to light and  accommodation. No scleral icterus. Extraocular muscles intact.  HEENT: Head atraumatic, normocephalic. Oropharynx and nasopharynx clear.  NECK:  Supple, no jugular venous distention. No thyroid enlargement, no tenderness.  LUNGS: Normal breath sounds bilaterally, no wheezing, rales,rhonchi or crepitation. No use of accessory muscles of respiration.  CARDIOVASCULAR: S1, S2 normal. No murmurs, rubs, or gallops.  ABDOMEN: Soft, non-tender, non-distended. Bowel sounds present. No organomegaly or mass.  EXTREMITIES: No pedal edema, cyanosis, or clubbing.  NEUROLOGIC: Cranial nerves II through XII are intact. Muscle strength 5/5 in all extremities. Sensation intact. Gait not checked.  PSYCHIATRIC: The patient is alert and oriented x 3.  SKIN: No obvious rash, lesion, or ulcer.   DATA REVIEW:   CBC  Recent Labs Lab 05/20/15 0441  WBC 7.1  HGB 14.7  HCT 44.2  PLT 253    Chemistries   Recent Labs Lab 05/20/15 0441  NA 139  K 3.8  CL 105  CO2 26  GLUCOSE 99  BUN 20  CREATININE 1.01  CALCIUM 8.8*    Cardiac Enzymes  Recent Labs Lab 05/19/15 1612  TROPONINI <0.03    Microbiology Results  No results found for this or any previous visit.  RADIOLOGY:  Dg Chest 2 View  05/19/2015  CLINICAL DATA:  Hypertension, chest pain, generalized weakness EXAM: CHEST  2 VIEW COMPARISON:  08/11/2010 FINDINGS: Cardiomediastinal silhouette is stable. No acute infiltrate or pleural effusion. No pulmonary edema. Bony thorax is unremarkable. IMPRESSION: No active cardiopulmonary disease. Electronically Signed   By: Lahoma Crocker M.D.   On: 05/19/2015 17:05    EKG:   Orders placed or performed during the hospital encounter of 05/19/15  . EKG 12-Lead  . EKG 12-Lead  . ED EKG within 10 minutes  . ED EKG within 10 minutes      Management plans discussed with the patient, family and they are in agreement.  CODE STATUS:     Code Status Orders        Start     Ordered   05/19/15 1829   Full code   Continuous     05/19/15 1828    Code Status History    Date Active Date Inactive Code Status Order ID Comments User Context   This patient has a current code status but no historical code status.      TOTAL TIME TAKING CARE OF THIS PATIENT:45  minutes.    _1 @  on 05/21/2015 at 12:48 PM  Between 7am to 6pm - Pager - [phone]  After 6pm go to [email or web address] - password [email or web address] [place] [facility]  Office  [phone]  CC: Primary care physician; Lavera Guise, MD

## 2015-05-21 NOTE — Discharge Instructions (Signed)
activity as tolerated Diet low-salt Follow-up with primary care physician or [facility] in one week Follow-up with outpatient psychiatry clinic or [facility]ry [facility] in 2-4 weeks Outpatient follow-up with cardiology Dr. Beola CordParra shows in 2 weeks

## 2015-05-21 NOTE — Evaluation (Signed)
Physical Therapy Evaluation Patient Details Name: Gerald LentFred H Martin MRN: [id number] DOB: 07/12/1965 Today's Date: 05/21/2015   History of Present Illness  Gerald Martin is a 50yo white male who [facility] from PCP after noted HTN in the setting of medications noncompliance. Vitals now stable, pt has been noted to be lethargic, flat affect, and minimally interested in participating in medical care. The patient has been noted to have suicidal ideation since admission, but per Psych is not perceived to be a threat to himself. PT is asked to evaluated for gait instability. 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 He notes that his girlfriend died about 1 year ago due to PNA and that he is living with friends outside of his home as he does not want to live where his girlfriend use to live.   Clinical Impression  Pt presenting with acute on chronic pain, lethargy, flat affect, and mild gait instability, but performing all functional mobility with modified indep. Functional mobility deficits, most notably gait speed deviations and MMT weakness, are suspected to be related to major depressive episode per attending. No additional PT needs at this time.     Follow Up Recommendations No PT follow up    Equipment Recommendations  None recommended by PT    Recommendations for Other Services       Precautions / Restrictions Precautions Precautions: Fall      Mobility  Bed Mobility Overal bed mobility: Independent                Transfers Overall transfer level: Independent                  Ambulation/Gait Ambulation/Gait assistance: Modified independent (Device/Increase time) Ambulation Distance (Feet): 450 Feet       Gait velocity interpretation: <1.8 ft/sec, indicative of risk for recurrent falls General Gait Details: Limited WB on Left with very mild slight knee bucklingon left  at midstance. occasional sway laterally with effect self correction.   Stairs Stairs: Yes   Stair Management: One rail Right Number of Stairs: 15 General stair comments: Pain in right ankle  Wheelchair Mobility    Modified Rankin (Stroke Patients Only)       Balance                                             Pertinent Vitals/Pain Pain Assessment: Faces Faces Pain Scale: Hurts even more Pain Location: Low back pain, worse with standing, even worse with walking (acute on chronic), pain in foot from old nail puncture (still healing although appears several months old), R ankle pain with muslce testing.  Pain Intervention(s): Limited activity within patient's tolerance    Home Living Family/patient expects to be discharged to:: Private residence Living Arrangements: Non-relatives/Friends Available Help at Discharge: Available PRN/intermittently;Friend(s) Type of Home: House Home Access: Stairs to enter Entrance Stairs-Rails: Right Entrance Stairs-Number of Steps: 4   Home Equipment: None      Prior Function Level of Independence: Independent         Comments: Reports he is unable to tolerate long distances, but does not provide much detail when asked follow up questions.      Hand Dominance        Extremity/Trunk Assessment  Lower Extremity Assessment: Generalized weakness;Overall WFL for tasks assessed         Communication   Communication: No difficulties  Cognition Arousal/Alertness: Lethargic Behavior During Therapy: Flat affect Overall Cognitive Status: Difficult to assess                      General Comments      Exercises        Assessment/Plan    PT Assessment Patent does not need any further PT services  PT Diagnosis     PT Problem List    PT Treatment Interventions     PT Goals (Current goals can be found in the Care Plan section) Acute Rehab PT Goals PT Goal Formulation: All  assessment and education complete, DC therapy    Frequency     Barriers to discharge        Co-evaluation               End of Session Equipment Utilized During Treatment: Gait belt Activity Tolerance: Patient limited by pain;Patient tolerated treatment well Patient left: in chair;with call bell/phone within reach           Time: 1216-1232 PT Time Calculation (min) (ACUTE ONLY): 16 min   Charges:   PT Evaluation $PT Eval Moderate Complexity: 1 Procedure PT Treatments $Therapeutic Activity: 8-22 mins   PT G Codes:       1:04 PM, 06/18/2015 Rosamaria Lints, PT, DPT PRN Physical Therapist - [facility] License # [id number] [phone] (mobile)

## 2015-05-31 DIAGNOSIS — F17210 Nicotine dependence, cigarettes, uncomplicated: Secondary | ICD-10-CM | POA: Diagnosis not present

## 2015-05-31 DIAGNOSIS — F332 Major depressive disorder, recurrent severe without psychotic features: Secondary | ICD-10-CM | POA: Diagnosis not present

## 2015-05-31 DIAGNOSIS — I119 Hypertensive heart disease without heart failure: Secondary | ICD-10-CM | POA: Diagnosis not present

## 2015-06-16 DIAGNOSIS — Z9119 Patient's noncompliance with other medical treatment and regimen: Secondary | ICD-10-CM | POA: Diagnosis not present

## 2015-06-16 DIAGNOSIS — R0602 Shortness of breath: Secondary | ICD-10-CM | POA: Diagnosis not present

## 2015-06-16 DIAGNOSIS — F332 Major depressive disorder, recurrent severe without psychotic features: Secondary | ICD-10-CM | POA: Diagnosis not present

## 2015-06-16 DIAGNOSIS — Z720 Tobacco use: Secondary | ICD-10-CM | POA: Diagnosis not present

## 2015-06-16 DIAGNOSIS — F4321 Adjustment disorder with depressed mood: Secondary | ICD-10-CM | POA: Diagnosis not present

## 2015-06-16 DIAGNOSIS — I10 Essential (primary) hypertension: Secondary | ICD-10-CM | POA: Diagnosis not present

## 2015-06-28 DIAGNOSIS — Z87898 Personal history of other specified conditions: Secondary | ICD-10-CM | POA: Diagnosis not present

## 2015-06-28 DIAGNOSIS — I10 Essential (primary) hypertension: Secondary | ICD-10-CM | POA: Diagnosis not present

## 2015-07-01 DIAGNOSIS — I119 Hypertensive heart disease without heart failure: Secondary | ICD-10-CM | POA: Diagnosis not present

## 2015-07-01 DIAGNOSIS — F5104 Psychophysiologic insomnia: Secondary | ICD-10-CM | POA: Diagnosis not present

## 2015-07-01 DIAGNOSIS — F332 Major depressive disorder, recurrent severe without psychotic features: Secondary | ICD-10-CM | POA: Diagnosis not present

## 2015-07-01 DIAGNOSIS — F17210 Nicotine dependence, cigarettes, uncomplicated: Secondary | ICD-10-CM | POA: Diagnosis not present

## 2015-07-15 ENCOUNTER — Emergency Department
Admission: EM | Admit: 2015-07-15 | Discharge: 2015-07-15 | Disposition: A | Discharge status: Home / Self Care | Payer: Medicare Other | Attending: Emergency Medicine | Admitting: Emergency Medicine

## 2015-07-15 DIAGNOSIS — F332 Major depressive disorder, recurrent severe without psychotic features: Secondary | ICD-10-CM | POA: Diagnosis not present

## 2015-07-15 DIAGNOSIS — Z79899 Other long term (current) drug therapy: Secondary | ICD-10-CM | POA: Diagnosis not present

## 2015-07-15 DIAGNOSIS — F17200 Nicotine dependence, unspecified, uncomplicated: Secondary | ICD-10-CM | POA: Diagnosis not present

## 2015-07-15 DIAGNOSIS — F329 Major depressive disorder, single episode, unspecified: Secondary | ICD-10-CM | POA: Diagnosis not present

## 2015-07-15 DIAGNOSIS — Z8679 Personal history of other diseases of the circulatory system: Secondary | ICD-10-CM | POA: Diagnosis not present

## 2015-07-15 DIAGNOSIS — IMO0001 Reserved for inherently not codable concepts without codable children: Secondary | ICD-10-CM

## 2015-07-15 DIAGNOSIS — R030 Elevated blood-pressure reading, without diagnosis of hypertension: Secondary | ICD-10-CM | POA: Insufficient documentation

## 2015-07-15 DIAGNOSIS — E785 Hyperlipidemia, unspecified: Secondary | ICD-10-CM | POA: Insufficient documentation

## 2015-07-15 DIAGNOSIS — J449 Chronic obstructive pulmonary disease, unspecified: Secondary | ICD-10-CM | POA: Insufficient documentation

## 2015-07-15 DIAGNOSIS — I10 Essential (primary) hypertension: Secondary | ICD-10-CM | POA: Diagnosis not present

## 2015-07-15 DIAGNOSIS — F341 Dysthymic disorder: Secondary | ICD-10-CM | POA: Diagnosis not present

## 2015-07-15 DIAGNOSIS — F32A Depression, unspecified: Secondary | ICD-10-CM

## 2015-07-15 LAB — COMPREHENSIVE METABOLIC PANEL
ALK PHOS: 77 U/L (ref 38–126)
ALT: 28 U/L (ref 17–63)
AST: 25 U/L (ref 15–41)
Albumin: 3.6 g/dL (ref 3.5–5.0)
Anion gap: 7 (ref 5–15)
BUN: 11 mg/dL (ref 6–20)
CALCIUM: 8.6 mg/dL — AB (ref 8.9–10.3)
CO2: 26 mmol/L (ref 22–32)
CREATININE: 0.86 mg/dL (ref 0.61–1.24)
Chloride: 104 mmol/L (ref 101–111)
Glucose, Bld: 87 mg/dL (ref 65–99)
Potassium: 4.1 mmol/L (ref 3.5–5.1)
Sodium: 137 mmol/L (ref 135–145)
Total Bilirubin: 0.6 mg/dL (ref 0.3–1.2)
Total Protein: 6.8 g/dL (ref 6.5–8.1)

## 2015-07-15 LAB — URINE DRUG SCREEN, QUALITATIVE (ARMC ONLY)
Amphetamines, Ur Screen: NOT DETECTED
Barbiturates, Ur Screen: NOT DETECTED
Benzodiazepine, Ur Scrn: NOT DETECTED
CANNABINOID 50 NG, UR ~~LOC~~: NOT DETECTED
COCAINE METABOLITE, UR ~~LOC~~: NOT DETECTED
MDMA (Ecstasy)Ur Screen: NOT DETECTED
Methadone Scn, Ur: NOT DETECTED
OPIATE, UR SCREEN: NOT DETECTED
PHENCYCLIDINE (PCP) UR S: NOT DETECTED
TRICYCLIC, UR SCREEN: NOT DETECTED

## 2015-07-15 LAB — CBC
HCT: 41.7 % (ref 40.0–52.0)
HEMOGLOBIN: 14.3 g/dL (ref 13.0–18.0)
MCH: 31.2 pg (ref 26.0–34.0)
MCHC: 34.2 g/dL (ref 32.0–36.0)
MCV: 91.1 fL (ref 80.0–100.0)
Platelets: 248 10*3/uL (ref 150–440)
RBC: 4.57 MIL/uL (ref 4.40–5.90)
RDW: 16.1 % — ABNORMAL HIGH (ref 11.5–14.5)
WBC: 7 10*3/uL (ref 3.8–10.6)

## 2015-07-15 LAB — SALICYLATE LEVEL

## 2015-07-15 LAB — ETHANOL: Alcohol, Ethyl (B): <5 mg/dL (ref <5)

## 2015-07-15 LAB — ACETAMINOPHEN LEVEL: Acetaminophen (Tylenol), Serum: <10 ug/mL — ABNORMAL LOW (ref 10–30)

## 2015-07-15 NOTE — ED Notes (Signed)
Social worker Teaching laboratory techniciancott Hunsaker called for transport. ETA 45 min.

## 2015-07-15 NOTE — ED Notes (Signed)
Irving ShowsScott Hunsaker from [facility].  [phone].

## 2015-07-15 NOTE — Progress Notes (Signed)
TTS has consulted with the pt. Pt reports that he went to [facility] for depression on today pt states that he just wanted someone to talk to because he lost his girlfriend X 1 year ago (May 2016) and continues to grieve her deatlh. Pt states that this was his first time going to therapy in a while. Pt reports that he tried explaining to the therapist that he had no intent of harming himself and that he is simply tired of living in the home where he watched his girlfriend die. Pt states that he is in the process of trying to relocated. Pt denies any previous SI attempts. Pt denies any previous hospital admissions. Pt. denies any suicidal ideation, plan or intent. Pt. denies the presence of any auditory or visual hallucinations at this time. Patient denies any other medical complaints. Pt has contracted for safety and the plan is to discharge the pt. [facility] health services.   07/15/2015 Gerald FlashNicole Hawken Bielby, MS, NCC, LPCA Therapeutic Triage Specialist

## 2015-07-15 NOTE — ED Notes (Signed)
Pt given cup of sprite.  

## 2015-07-15 NOTE — Consult Note (Signed)
[facility] Consult   Reason for Consult:  Consult for 50 year old man who was sent here from [place] because of concerns about suicidal aching. [place] Referring Physician:  Burlene Arnt Patient Identification: Gerald Martin MRN:  [id number] Principal Diagnosis: Severe recurrent major depression without psychotic features [facility]) Diagnosis:   Patient Active Problem List   Diagnosis Date Noted  . Severe recurrent major depression without psychotic features ([place]) [F33.2] 05/20/2015  . Noncompliance [Z91.19] 05/20/2015  . Grief [F43.21] 05/20/2015  . Accelerated hypertension [I10] 05/19/2015  . Calculus of gallbladder with other cholecystitis, without mention of obstruction [K80.10] 10/07/2012  . Gallstones [K80.20] 09/17/2012    Total Time spent with patient: 1 hour  Subjective:   Gerald Martin is a 50 y.o. male patient admitted with "I just wanted someone to talk to".  HPI:  Patient interviewed. Old chart reviewed including my prior note. Case reviewed with emergency room physician and TTS. Labs reviewed. 50 year old man apparently went to [place] today wanting to talk about his depression. In the course of the interview he told them that he had had thoughts about killing himself at which point they insisted he come to the emergency room. Patient says he's been depressed for a long time probably at least a year since his girlfriend died. Mood stays down all the time. Feels nervous being around people. Doesn't sleep very well unless he takes his trazodone. Hasn't been eating very well but he blames that in part on not having any money and not having any food stamps. He says he has had some thoughts about killing himself but has never acted on it and has no intention or plan of acting on it. No homicidal ideation. Denies any current psychotic symptoms. He says he does have his Prozac at home and has been taking it although it sounds like he is only intermittently compliant.  Social  history: Chronically disabled man lives in a trailer by himself. His girlfriend died a year ago. He has been torn up and depressed ever since then. Not currently working. Sounds like he has family but he is a little bit skittish of them.  Medical history: Patient has severe high blood pressure and based on his vitals today probably isn't taking care of it. He has a history of gallstone problems in the past.  Substance abuse history: Denies that he drinks alcohol regularly and denies any alcohol or drug problems.  Past Psychiatric History: Patient has a history of recurrent depression. He has been treated with Prozac and is supposed to be taking 60 mg a day. He thinks he's been on other medicines in the past but he can't remember them very well. He denies that he is ever actually tried to kill himself or had inpatient hospitalization. I saw him in the hospital a couple months ago on the consult service and he was depressed at that point.  Risk to Self: Is patient at risk for suicide?: No Risk to Others:   Prior Inpatient Therapy:   Prior Outpatient Therapy:    Past Medical History:  Past Medical History  Diagnosis Date  . Chronic pain     back  . Hypertension   . Hyperlipidemia   . GERD (gastroesophageal reflux disease)   . Anxiety   . Depression   . COPD (chronic obstructive pulmonary disease) [facility])     Past Surgical History  Procedure Laterality Date  . Arm surgery[other] Left     chain saw injury  . Leg surgery Right   .  Chronic pain     Family History:  Family History  Problem Relation Age of Onset  . Diabetes Father   . Stroke Mother    Family Psychiatric  History: Patient denies any family history of depression. The only mental health history he reports is a grandmother who had Alzheimer's disease. Social History:  History  Alcohol Use  . Yes     History  Drug Use No    Social History   Social History  . Marital Status: Married    Spouse Name: N/A  . Number of  Children: N/A  . Years of Education: N/A   Social History Main Topics  . Smoking status: Current Every Day Smoker -- 2.00 packs/day for 30 years  . Smokeless tobacco: None  . Alcohol Use: Yes  . Drug Use: No  . Sexual Activity: Not Asked   Other Topics Concern  . None   Social History Narrative   Additional Social History:    Allergies:  No Known Allergies  Labs:  Results for orders placed or performed during the hospital encounter of 07/15/15 (from the past 48 hour(s))  Comprehensive metabolic panel     Status: Abnormal   Collection Time: 07/15/15  5:09 PM  Result Value Ref Range   Sodium 137 135 - 145 mmol/L   Potassium 4.1 3.5 - 5.1 mmol/L   Chloride 104 101 - 111 mmol/L   CO2 26 22 - 32 mmol/L   Glucose, Bld 87 65 - 99 mg/dL   BUN 11 6 - 20 mg/dL   Creatinine, Ser 0.86 0.61 - 1.24 mg/dL   Calcium 8.6 (L) 8.9 - 10.3 mg/dL   Total Protein 6.8 6.5 - 8.1 g/dL   Albumin 3.6 3.5 - 5.0 g/dL   AST 25 15 - 41 U/L   ALT 28 17 - 63 U/L   Alkaline Phosphatase 77 38 - 126 U/L   Total Bilirubin 0.6 0.3 - 1.2 mg/dL   GFR calc non Af Amer >60 >60 mL/min   GFR calc Af Amer >60 >60 mL/min    Comment: (NOTE) The eGFR has been calculated using the CKD EPI equation. This calculation has not been validated in all clinical situations. eGFR's persistently <60 mL/min signify possible Chronic Kidney Disease.    Anion gap 7 5 - 15  Ethanol     Status: None   Collection Time: 07/15/15  5:09 PM  Result Value Ref Range   Alcohol, Ethyl (B) <5 <5 mg/dL    Comment:        LOWEST DETECTABLE LIMIT FOR SERUM ALCOHOL IS 5 mg/dL FOR MEDICAL PURPOSES ONLY   Salicylate level     Status: None   Collection Time: 07/15/15  5:09 PM  Result Value Ref Range   Salicylate Lvl <2.8 2.8 - 30.0 mg/dL  Acetaminophen level     Status: Abnormal   Collection Time: 07/15/15  5:09 PM  Result Value Ref Range   Acetaminophen (Tylenol), Serum <10 (L) 10 - 30 ug/mL    Comment:        THERAPEUTIC  CONCENTRATIONS VARY SIGNIFICANTLY. A RANGE OF 10-30 ug/mL MAY BE AN EFFECTIVE CONCENTRATION FOR MANY PATIENTS. HOWEVER, SOME ARE BEST TREATED AT CONCENTRATIONS OUTSIDE THIS RANGE. ACETAMINOPHEN CONCENTRATIONS >150 ug/mL AT 4 HOURS AFTER INGESTION AND >50 ug/mL AT 12 HOURS AFTER INGESTION ARE OFTEN ASSOCIATED WITH TOXIC REACTIONS.   cbc     Status: Abnormal   Collection Time: 07/15/15  5:09 PM  Result Value Ref Range  WBC 7.0 3.8 - 10.6 K/uL   RBC 4.57 4.40 - 5.90 MIL/uL   Hemoglobin 14.3 13.0 - 18.0 g/dL   HCT 41.7 40.0 - 52.0 %   MCV 91.1 80.0 - 100.0 fL   MCH 31.2 26.0 - 34.0 pg   MCHC 34.2 32.0 - 36.0 g/dL   RDW 16.1 (H) 11.5 - 14.5 %   Platelets 248 150 - 440 K/uL  Urine Drug Screen, Qualitative     Status: None   Collection Time: 07/15/15  5:09 PM  Result Value Ref Range   Tricyclic, Ur Screen NONE DETECTED NONE DETECTED   Amphetamines, Ur Screen NONE DETECTED NONE DETECTED   MDMA (Ecstasy)Ur Screen NONE DETECTED NONE DETECTED   Cocaine Metabolite,Ur [place] NONE DETECTED NONE DETECTED   Opiate, Ur Screen NONE DETECTED NONE DETECTED   Phencyclidine (PCP) Ur S NONE DETECTED NONE DETECTED   Cannabinoid 50 Martin, Ur  [place] NONE DETECTED NONE DETECTED   Barbiturates, Ur Screen NONE DETECTED NONE DETECTED   Benzodiazepine, Ur Scrn NONE DETECTED NONE DETECTED   Methadone Scn, Ur NONE DETECTED NONE DETECTED    Comment: (NOTE) 614  Tricyclics, urine               Cutoff 1000 Martin/mL 200  Amphetamines, urine             Cutoff 1000 Martin/mL 300  MDMA (Ecstasy), urine           Cutoff 500 Martin/mL 400  Cocaine Metabolite, urine       Cutoff 300 Martin/mL 500  Opiate, urine                   Cutoff 300 Martin/mL 600  Phencyclidine (PCP), urine      Cutoff 25 Martin/mL 700  Cannabinoid, urine              Cutoff 50 Martin/mL 800  Barbiturates, urine             Cutoff 200 Martin/mL 900  Benzodiazepine, urine           Cutoff 200 Martin/mL 1000 Methadone, urine                Cutoff 300 Martin/mL 1100 1200 The urine  drug screen provides only a preliminary, unconfirmed 1300 analytical test result and should not be used for non-medical 1400 purposes. Clinical consideration and professional judgment should 1500 be applied to any positive drug screen result due to possible 1600 interfering substances. A more specific alternate chemical method 1700 must be used in order to obtain a confirmed analytical result.  1800 Gas chromato graphy / mass spectrometry (GC/MS) is the preferred 1900 confirmatory method.     No current facility-administered medications for this encounter.   Current Outpatient Prescriptions  Medication Sig Dispense Refill  . acetaminophen (TYLENOL) 325 MG tablet Take 2 tablets (650 mg total) by mouth every 6 (six) hours as needed for mild pain (or Fever >/= 101).    . carvedilol (COREG) 12.5 MG tablet Take 1 tablet (12.5 mg total) by mouth 2 (two) times daily with a meal. 60 tablet 0  . FLUoxetine (PROZAC) 20 MG capsule Take 3 capsules (60 mg total) by mouth daily. 30 capsule 1  . folic acid (FOLVITE) 1 MG tablet Take 1 tablet (1 mg total) by mouth daily.    . Ipratropium-Albuterol (COMBIVENT RESPIMAT) 20-100 MCG/ACT AERS respimat Inhale 1 puff into the lungs 4 (four) times daily.    Marland Kitchen lisinopril-hydrochlorothiazide (PRINZIDE,ZESTORETIC) 20-12.5 MG  tablet Take 2 tablets by mouth daily. 60 tablet 0  . Multiple Vitamin (MULTIVITAMIN WITH MINERALS) TABS tablet Take 1 tablet by mouth daily with supper.    . nicotine (NICODERM CQ - DOSED IN MG/24 HOURS) 21 mg/24hr patch Place 1 patch (21 mg total) onto the skin daily. 28 patch 0  . ranitidine (ZANTAC) 150 MG tablet Take 150 mg by mouth 2 (two) times daily.    Marland Kitchen tiotropium (SPIRIVA) 18 MCG inhalation capsule Place 18 mcg into inhaler and inhale daily.    . traMADol (ULTRAM) 50 MG tablet Take 1 tablet (50 mg total) by mouth 3 (three) times daily. 30 tablet 0  . traZODone (DESYREL) 50 MG tablet Take 50 mg by mouth at bedtime as needed for sleep.       Musculoskeletal: Strength & Muscle Tone: within normal limits Gait & Station: normal Patient leans: N/A  Psychiatric Specialty Exam: Physical Exam  Nursing note and vitals reviewed. Constitutional: He appears well-developed and well-nourished.  HENT:  Head: Normocephalic and atraumatic.  Eyes: Conjunctivae are normal. Pupils are equal, round, and reactive to light.  Neck: Normal range of motion.  Cardiovascular: Normal heart sounds.   Respiratory: Effort normal. No respiratory distress.  GI: Soft.  Musculoskeletal: Normal range of motion.  Neurological: He is alert.  Skin: Skin is warm and dry.  Psychiatric: Judgment normal. His affect is blunt. His speech is delayed. He is slowed. He exhibits a depressed mood. He expresses no suicidal ideation.    Review of Systems  Constitutional: Negative.   HENT: Negative.   Eyes: Negative.   Respiratory: Negative.   Cardiovascular: Negative.   Gastrointestinal: Negative.   Musculoskeletal: Negative.   Skin: Negative.   Neurological: Negative.   Psychiatric/Behavioral: Positive for depression and suicidal ideas. Negative for hallucinations, memory loss and substance abuse. The patient is nervous/anxious and has insomnia.     Blood pressure 202/115, pulse 70, temperature 97.8 F (36.6 C), temperature source Oral, resp. rate 18, height '6\' 4"'  (1.93 m), weight 77.111 kg (170 lb), SpO2 96 %.Body mass index is 20.7 kg/(m^2).  General Appearance: Disheveled  Eye Contact:  Minimal  Speech:  Slow  Volume:  Decreased  Mood:  Anxious and Dysphoric  Affect:  Blunt  Thought Process:  Goal Directed  Orientation:  Full (Time, Place, and Person)  Thought Content:  Logical  Suicidal Thoughts:  Yes.  without intent/plan  Homicidal Thoughts:  No  Memory:  Immediate;   Good Recent;   Fair Remote;   Fair  Judgement:  Fair  Insight:  Fair  Psychomotor Activity:  Decreased  Concentration:  Concentration: Fair  Recall:  [employer] of  Knowledge:  Fair  Language:  Fair  Akathisia:  No  Handed:  Right  AIMS (if indicated):     Assets:  Desire for Improvement Housing Resilience  ADL's:  Intact  Cognition:  WNL  Sleep:        Treatment Plan Summary: Plan 50 year old man with a history of recurrent major depression. No sign of psychosis. He has had suicidal thoughts but he denies any intention of acting on them. Patient is agreeable to continuing with outpatient treatment. He will be referred back to [facility] and will continue on his current medicine. No need for IVC. Case reviewed with emergency room physician.  Disposition: Patient does not meet criteria for psychiatric inpatient admission. Supportive therapy provided about ongoing stressors.  Alethia Berthold, MD 07/15/2015 7:19 PM

## 2015-07-15 NOTE — ED Provider Notes (Signed)
[facility] Emergency Department Provider Note  ____________________________________________   I have reviewed the triage vital signs and the nursing notes.   HISTORY  Chief Complaint Depression    HPI Gerald Martin is a 50 y.o. male presents today to the emergency department complaining of depressed thoughts. Patient's girlfriend died of pneumonia personally one year ago when he still same trailer home. He states he told [facility] that he did not want to go on living in the same trailer home as his girlfriend used to because it was depressing to him and expensive. However, he believes him as interpreted this to mean that he did not want to go on living. I does admit to some depression he has no SI or HI he does not want to kill himself he states. He has no other complaints. He did not take his blood pressure medication today   Past Medical History  Diagnosis Date  . Chronic pain     back  . Hypertension   . Hyperlipidemia   . GERD (gastroesophageal reflux disease)   . Anxiety   . Depression   . COPD (chronic obstructive pulmonary disease) [facility])     Patient Active Problem List   Diagnosis Date Noted  . Severe recurrent major depression without psychotic features (H[facility]) 05/20/2015  . Noncompliance 05/20/2015  . Grief 05/20/2015  . Accelerated hypertension 05/19/2015  . Calculus of gallbladder with other cholecystitis, without mention of obstruction 10/07/2012  . Gallstones 09/17/2012    Past Surgical History  Procedure Laterality Date  . Arm surgery[other] Left     chain saw injury  . Leg surgery Right   . Chronic pain      Current Outpatient Rx  Name  Route  Sig  Dispense  Refill  . acetaminophen (TYLENOL) 325 MG tablet   Oral   Take 2 tablets (650 mg total) by mouth every 6 (six) hours as needed for mild pain (or Fever >/= 101).         . carvedilol (COREG) 12.5 MG tablet   Oral   Take 1 tablet (12.5 mg total) by mouth 2  (two) times daily with a meal.   60 tablet   0   . FLUoxetine (PROZAC) 20 MG capsule   Oral   Take 3 capsules (60 mg total) by mouth daily.   30 capsule   1   . folic acid (FOLVITE) 1 MG tablet   Oral   Take 1 tablet (1 mg total) by mouth daily.         . Ipratropium-Albuterol (COMBIVENT RESPIMAT) 20-100 MCG/ACT AERS respimat   Inhalation   Inhale 1 puff into the lungs 4 (four) times daily.         Marland Kitchen lisinopril-hydrochlorothiazide (PRINZIDE,ZESTORETIC) 20-12.5 MG tablet   Oral   Take 2 tablets by mouth daily.   60 tablet   0   . Multiple Vitamin (MULTIVITAMIN WITH MINERALS) TABS tablet   Oral   Take 1 tablet by mouth daily with supper.         . nicotine (NICODERM CQ - DOSED IN MG/24 HOURS) 21 mg/24hr patch   Transdermal   Place 1 patch (21 mg total) onto the skin daily.   28 patch   0   . ranitidine (ZANTAC) 150 MG tablet   Oral   Take 150 mg by mouth 2 (two) times daily.         Marland Kitchen tiotropium (SPIRIVA) 18 MCG inhalation capsule  Inhalation   Place 18 mcg into inhaler and inhale daily.         . traMADol (ULTRAM) 50 MG tablet   Oral   Take 1 tablet (50 mg total) by mouth 3 (three) times daily.   30 tablet   0   . traZODone (DESYREL) 50 MG tablet   Oral   Take 50 mg by mouth at bedtime as needed for sleep.           Allergies Review of patient's allergies indicates no known allergies.  Family History  Problem Relation Age of Onset  . Diabetes Father   . Stroke Mother     Social History Social History  Substance Use Topics  . Smoking status: Current Every Day Smoker -- 2.00 packs/day for 30 years  . Smokeless tobacco: None  . Alcohol Use: Yes    Review of Systems Constitutional: No fever/chills Eyes: No visual changes. ENT: No sore throat. No stiff neck no neck pain Cardiovascular: Denies chest pain. Respiratory: Denies shortness of breath. Gastrointestinal:   no vomiting.  No diarrhea.  No constipation. Genitourinary: Negative  for dysuria. Musculoskeletal: Negative lower extremity swelling Skin: Negative for rash. Neurological: Negative for headaches, focal weakness or numbness. 10-point ROS otherwise negative.  ____________________________________________   PHYSICAL EXAM:  VITAL SIGNS: ED Triage Vitals  Enc Vitals Group     BP 07/15/15 1658 202/115 mmHg     Pulse Rate 07/15/15 1658 70     Resp 07/15/15 1658 18     Temp 07/15/15 1658 97.8 F (36.6 C)     Temp Source 07/15/15 1658 Oral     SpO2 07/15/15 1658 96 %     Weight 07/15/15 1658 170 lb (77.111 kg)     Height 07/15/15 1658 6\' 4"  (1.93 m)     Head Cir --      Peak Flow --      Pain Score --      Pain Loc --      Pain Edu? --      Excl. in GC? --     Constitutional: Alert and oriented. Well appearing and in no acute distress. Eyes: Conjunctivae are normal. PERRL. EOMI. Head: Atraumatic. Nose: No congestion/rhinnorhea. Mouth/Throat: Mucous membranes are moist.  Oropharynx non-erythematous. Neck: No stridor.   Nontender with no meningismus Cardiovascular: Normal rate, regular rhythm. Grossly normal heart sounds.  Good peripheral circulation. Respiratory: Normal respiratory effort.  No retractions. Lungs CTAB. Abdominal: Soft and nontender. No distention. No guarding no rebound Back:  There is no focal tenderness or step off there is no midline tenderness there are no lesions noted. there is no CVA tenderness Musculoskeletal: No lower extremity tenderness. No joint effusions, no DVT signs strong distal pulses no edema Neurologic:  Normal speech and language. No gross focal neurologic deficits are appreciated.  Skin:  Skin is warm, dry and intact. No rash noted. Psychiatric: Mood and affect are normal. Speech and behavior are normal.  ____________________________________________   LABS (all labs ordered are listed, but only abnormal results are displayed)  Labs Reviewed  COMPREHENSIVE METABOLIC PANEL - Abnormal; Notable for the  following:    Calcium 8.6 (*)    All other components within normal limits  ACETAMINOPHEN LEVEL - Abnormal; Notable for the following:    Acetaminophen (Tylenol), Serum <10 (*)    All other components within normal limits  CBC - Abnormal; Notable for the following:    RDW 16.1 (*)    All other components within  normal limits  ETHANOL  SALICYLATE LEVEL  URINE DRUG SCREEN, QUALITATIVE ([facility] ONLY)   ____________________________________________  EKG  I personally interpreted any EKGs ordered by me or triage  ____________________________________________  RADIOLOGY  I reviewed any imaging ordered by me or triage that were performed during my shift and, if possible, patient and/or family made aware of any abnormal findings. ____________________________________________   PROCEDURES  Procedure(s) performed: None  Critical Care performed: None  ____________________________________________   INITIAL IMPRESSION / ASSESSMENT AND PLAN / ED COURSE  Pertinent labs & imaging results that were available during my care of the patient were reviewed by me and considered in my medical decision making (see chart for details).  Patient with no ongoing SI or HI sent in by [facility] because of what he believes was a missed communication. He was seen and evaluated here by psychiatry. They agreed the patient has no ongoing threat to himself or others, and they agree with discharge. Patient will follow up closely with [facility]. We did counsel him about his elevated blood pressure and he will take his medications he states. No chest pain or shortness of breath or other symptoms to suggest that this incidental finding requires further intervention from us.  FINAL CLINICAL IMPRESSION(S) / ED DIAGNOSES  Final diagnoses:  None      This chart was dictated using voice recognition software.  Despite best efforts to proofread,  errors can occur which can change meaning.     Jeanmarie PlantJames A Amorita Vanrossum,  MD 07/15/15 [phone]

## 2015-07-15 NOTE — ED Notes (Signed)
Pt arrives to ER via POV from [facility] for depression because of losing his girlfriend X 1 year ago. PT did admit about thinking about killing himself, but does not have plan or attempts. Pt denies HI. No previous attempts of SI.

## 2015-10-14 DIAGNOSIS — Z0001 Encounter for general adult medical examination with abnormal findings: Secondary | ICD-10-CM | POA: Diagnosis not present

## 2015-10-14 DIAGNOSIS — M542 Cervicalgia: Secondary | ICD-10-CM | POA: Diagnosis not present

## 2015-10-14 DIAGNOSIS — I119 Hypertensive heart disease without heart failure: Secondary | ICD-10-CM | POA: Diagnosis not present

## 2015-10-14 DIAGNOSIS — F332 Major depressive disorder, recurrent severe without psychotic features: Secondary | ICD-10-CM | POA: Diagnosis not present

## 2015-10-14 DIAGNOSIS — F5104 Psychophysiologic insomnia: Secondary | ICD-10-CM | POA: Diagnosis not present

## 2015-12-22 DIAGNOSIS — R0602 Shortness of breath: Secondary | ICD-10-CM | POA: Diagnosis not present

## 2015-12-22 DIAGNOSIS — I10 Essential (primary) hypertension: Secondary | ICD-10-CM | POA: Diagnosis not present

## 2015-12-22 DIAGNOSIS — Z720 Tobacco use: Secondary | ICD-10-CM | POA: Diagnosis not present

## 2016-01-27 DIAGNOSIS — J449 Chronic obstructive pulmonary disease, unspecified: Secondary | ICD-10-CM | POA: Diagnosis not present

## 2016-01-27 DIAGNOSIS — I119 Hypertensive heart disease without heart failure: Secondary | ICD-10-CM | POA: Diagnosis not present

## 2016-01-27 DIAGNOSIS — F17210 Nicotine dependence, cigarettes, uncomplicated: Secondary | ICD-10-CM | POA: Diagnosis not present

## 2016-01-27 DIAGNOSIS — Z9119 Patient's noncompliance with other medical treatment and regimen: Secondary | ICD-10-CM | POA: Diagnosis not present

## 2016-01-27 DIAGNOSIS — F3341 Major depressive disorder, recurrent, in partial remission: Secondary | ICD-10-CM | POA: Diagnosis not present

## 2016-03-03 DIAGNOSIS — I119 Hypertensive heart disease without heart failure: Secondary | ICD-10-CM | POA: Diagnosis not present

## 2016-03-03 DIAGNOSIS — J449 Chronic obstructive pulmonary disease, unspecified: Secondary | ICD-10-CM | POA: Diagnosis not present

## 2016-03-03 DIAGNOSIS — M545 Low back pain: Secondary | ICD-10-CM | POA: Diagnosis not present

## 2016-03-03 DIAGNOSIS — F3341 Major depressive disorder, recurrent, in partial remission: Secondary | ICD-10-CM | POA: Diagnosis not present

## 2016-03-03 DIAGNOSIS — F17210 Nicotine dependence, cigarettes, uncomplicated: Secondary | ICD-10-CM | POA: Diagnosis not present

## 2016-03-07 DIAGNOSIS — J449 Chronic obstructive pulmonary disease, unspecified: Secondary | ICD-10-CM | POA: Diagnosis not present

## 2016-03-07 DIAGNOSIS — F17210 Nicotine dependence, cigarettes, uncomplicated: Secondary | ICD-10-CM | POA: Diagnosis not present

## 2016-03-07 DIAGNOSIS — I119 Hypertensive heart disease without heart failure: Secondary | ICD-10-CM | POA: Diagnosis not present

## 2016-03-07 DIAGNOSIS — F3341 Major depressive disorder, recurrent, in partial remission: Secondary | ICD-10-CM | POA: Diagnosis not present

## 2016-03-07 DIAGNOSIS — M545 Low back pain: Secondary | ICD-10-CM | POA: Diagnosis not present

## 2016-03-09 DIAGNOSIS — I119 Hypertensive heart disease without heart failure: Secondary | ICD-10-CM | POA: Diagnosis not present

## 2016-03-09 DIAGNOSIS — F17210 Nicotine dependence, cigarettes, uncomplicated: Secondary | ICD-10-CM | POA: Diagnosis not present

## 2016-03-09 DIAGNOSIS — J449 Chronic obstructive pulmonary disease, unspecified: Secondary | ICD-10-CM | POA: Diagnosis not present

## 2016-03-09 DIAGNOSIS — M545 Low back pain: Secondary | ICD-10-CM | POA: Diagnosis not present

## 2016-03-09 DIAGNOSIS — F3341 Major depressive disorder, recurrent, in partial remission: Secondary | ICD-10-CM | POA: Diagnosis not present

## 2016-03-14 DIAGNOSIS — F17210 Nicotine dependence, cigarettes, uncomplicated: Secondary | ICD-10-CM | POA: Diagnosis not present

## 2016-03-14 DIAGNOSIS — I119 Hypertensive heart disease without heart failure: Secondary | ICD-10-CM | POA: Diagnosis not present

## 2016-03-14 DIAGNOSIS — F3341 Major depressive disorder, recurrent, in partial remission: Secondary | ICD-10-CM | POA: Diagnosis not present

## 2016-03-14 DIAGNOSIS — M545 Low back pain: Secondary | ICD-10-CM | POA: Diagnosis not present

## 2016-03-14 DIAGNOSIS — J449 Chronic obstructive pulmonary disease, unspecified: Secondary | ICD-10-CM | POA: Diagnosis not present

## 2016-03-22 DIAGNOSIS — M545 Low back pain: Secondary | ICD-10-CM | POA: Diagnosis not present

## 2016-03-22 DIAGNOSIS — I119 Hypertensive heart disease without heart failure: Secondary | ICD-10-CM | POA: Diagnosis not present

## 2016-03-22 DIAGNOSIS — F3341 Major depressive disorder, recurrent, in partial remission: Secondary | ICD-10-CM | POA: Diagnosis not present

## 2016-03-22 DIAGNOSIS — J449 Chronic obstructive pulmonary disease, unspecified: Secondary | ICD-10-CM | POA: Diagnosis not present

## 2016-03-22 DIAGNOSIS — F17210 Nicotine dependence, cigarettes, uncomplicated: Secondary | ICD-10-CM | POA: Diagnosis not present

## 2016-04-18 DIAGNOSIS — F4321 Adjustment disorder with depressed mood: Secondary | ICD-10-CM | POA: Diagnosis not present

## 2016-04-18 DIAGNOSIS — Z720 Tobacco use: Secondary | ICD-10-CM | POA: Diagnosis not present

## 2016-04-18 DIAGNOSIS — R0602 Shortness of breath: Secondary | ICD-10-CM | POA: Diagnosis not present

## 2016-04-18 DIAGNOSIS — I10 Essential (primary) hypertension: Secondary | ICD-10-CM | POA: Diagnosis not present

## 2016-05-25 DIAGNOSIS — F5104 Psychophysiologic insomnia: Secondary | ICD-10-CM | POA: Diagnosis not present

## 2016-05-25 DIAGNOSIS — F3341 Major depressive disorder, recurrent, in partial remission: Secondary | ICD-10-CM | POA: Diagnosis not present

## 2016-05-25 DIAGNOSIS — I10 Essential (primary) hypertension: Secondary | ICD-10-CM | POA: Diagnosis not present

## 2016-05-25 DIAGNOSIS — M549 Dorsalgia, unspecified: Secondary | ICD-10-CM | POA: Diagnosis not present

## 2016-09-04 DIAGNOSIS — F332 Major depressive disorder, recurrent severe without psychotic features: Secondary | ICD-10-CM | POA: Diagnosis not present

## 2017-01-11 ENCOUNTER — Emergency Department
Admission: EM | Admit: 2017-01-11 | Discharge: 2017-01-11 | Disposition: A | Discharge status: Home / Self Care | Payer: Medicare Other | Attending: Emergency Medicine | Admitting: Emergency Medicine

## 2017-01-11 ENCOUNTER — Other Ambulatory Visit: Payer: Self-pay

## 2017-01-11 ENCOUNTER — Encounter: Payer: Self-pay | Admitting: Emergency Medicine

## 2017-01-11 DIAGNOSIS — F17200 Nicotine dependence, unspecified, uncomplicated: Secondary | ICD-10-CM | POA: Diagnosis not present

## 2017-01-11 DIAGNOSIS — J449 Chronic obstructive pulmonary disease, unspecified: Secondary | ICD-10-CM | POA: Diagnosis not present

## 2017-01-11 DIAGNOSIS — F17210 Nicotine dependence, cigarettes, uncomplicated: Secondary | ICD-10-CM | POA: Diagnosis not present

## 2017-01-11 DIAGNOSIS — Z79899 Other long term (current) drug therapy: Secondary | ICD-10-CM | POA: Diagnosis not present

## 2017-01-11 DIAGNOSIS — I10 Essential (primary) hypertension: Secondary | ICD-10-CM

## 2017-01-11 DIAGNOSIS — Z91148 Patient's other noncompliance with medication regimen for other reason: Secondary | ICD-10-CM

## 2017-01-11 DIAGNOSIS — Z9114 Patient's other noncompliance with medication regimen: Secondary | ICD-10-CM | POA: Diagnosis not present

## 2017-01-11 NOTE — ED Notes (Signed)
See triage note  Sent in by home health nurse b/c of htn  States his b/p was 180/124 at home  States he has not taken his meds for about 2 weeks denies any specific sx's except just being tired

## 2017-01-11 NOTE — ED Provider Notes (Signed)
[facility] Emergency Department Provider Note  ____________________________________________   First MD Initiated Contact with Patient 01/11/17 1540     (approximate)  I have reviewed the triage vital signs and the nursing notes.   HISTORY  Chief Complaint Hypertension   HPI Gerald Martin is a 51 y.o. male is here to have blood pressure rechecked. Patient states he has not taken his blood pressure medication last 2 weeks. Today the home health nurse took his blood pressure which was elevated. His social worker is here with him to make sure that his blood pressure is controlled. Patient took lisinopril prior to arrival.   Past Medical History:  Diagnosis Date  . Anxiety   . Chronic pain    back  . COPD (chronic obstructive pulmonary disease) (HCC)   . Depression   . GERD (gastroesophageal reflux disease)   . Hyperlipidemia   . Hypertension     Patient Active Problem List   Diagnosis Date Noted  . Severe recurrent major depression without psychotic features (HCC) 05/20/2015  . Noncompliance 05/20/2015  . Grief 05/20/2015  . Accelerated hypertension 05/19/2015  . Calculus of gallbladder with other cholecystitis, without mention of obstruction 10/07/2012  . Gallstones 09/17/2012    Past Surgical History:  Procedure Laterality Date  . arm surgery[Other] Left    chain saw injury  . chronic pain    . LEG SURGERY Right     Prior to Admission medications   Medication Sig Start Date End Date Taking? Authorizing Provider  acetaminophen (TYLENOL) 325 MG tablet Take 2 tablets (650 mg total) by mouth every 6 (six) hours as needed for mild pain (or Fever >/= 101). 05/21/15   Gouru, Aruna, MD  carvedilol (COREG) 12.5 MG tablet Take 1 tablet (12.5 mg total) by mouth 2 (two) times daily with a meal. 05/21/15   Gouru, Aruna, MD  FLUoxetine (PROZAC) 20 MG capsule Take 3 capsules (60 mg total) by mouth daily. 05/21/15   Ramonita LabGouru, Aruna, MD  folic acid (FOLVITE)  1 MG tablet Take 1 tablet (1 mg total) by mouth daily. 05/21/15   Gouru, Deanna ArtisAruna, MD  Ipratropium-Albuterol (COMBIVENT RESPIMAT) 20-100 MCG/ACT AERS respimat Inhale 1 puff into the lungs 4 (four) times daily.    [provider]  lisinopril-hydrochlorothiazide (PRINZIDE,ZESTORETIC) 20-12.5 MG tablet Take 2 tablets by mouth daily. 05/21/15   Ramonita LabGouru, Aruna, MD  Multiple Vitamin (MULTIVITAMIN WITH MINERALS) TABS tablet Take 1 tablet by mouth daily with supper. 05/21/15   Ramonita LabGouru, Aruna, MD  ranitidine (ZANTAC) 150 MG tablet Take 150 mg by mouth 2 (two) times daily.    [provider]  tiotropium (SPIRIVA) 18 MCG inhalation capsule Place 18 mcg into inhaler and inhale daily.    [provider]  traZODone (DESYREL) 50 MG tablet Take 50 mg by mouth at bedtime as needed for sleep.    [provider]    Allergies Patient has no known allergies.  Family History  Problem Relation Age of Onset  . Stroke Mother   . Diabetes Father     Social History Social History   Tobacco Use  . Smoking status: Current Every Day Smoker    Packs/day: 2.00    Years: 30.00    Pack years: 60.00  Substance Use Topics  . Alcohol use: Yes  . Drug use: No    Review of Systems Constitutional: No fever/chills Eyes: No visual changes. Cardiovascular: Denies chest pain. Respiratory: Denies shortness of breath. Gastrointestinal: No abdominal pain.  No  nausea, no vomiting.  Musculoskeletal: Negative for back pain. Skin: Negative for rash. Neurological: Negative for headaches, focal weakness or numbness. ____________________________________________   PHYSICAL EXAM:  VITAL SIGNS: ED Triage Vitals [01/11/17 1529]  Enc Vitals Group     BP 134/83     Pulse Rate 94     Resp 16     Temp 98.3 F (36.8 C)     Temp Source Oral     SpO2 98 %     Weight 180 lb (81.6 kg)     Height 6\' 4"  (1.93 m)     Head Circumference      Peak Flow      Pain Score      Pain Loc      Pain Edu?       Excl. in GC?    Constitutional: Alert and oriented. Well appearing and in no acute distress. Eyes: Conjunctivae are normal.  Head: Atraumatic. Neck: No stridor.   Cardiovascular: Normal rate, regular rhythm. Grossly normal heart sounds.  Good peripheral circulation. Respiratory: Normal respiratory effort.  No retractions. Lungs CTAB. Musculoskeletal: moves upper and lower extremities without difficulty. Normal gait was noted. Neurologic:  Normal speech and language. No gross focal neurologic deficits are appreciated. No gait instability. Skin:  Skin is warm, dry and intact. No rash noted. Psychiatric: Mood and affect are normal. Speech and behavior are normal.  ____________________________________________   LABS (all labs ordered are listed, but only abnormal results are displayed)  Labs Reviewed - No data to display  PROCEDURES  Procedure(s) performed: None  Procedures  Critical Care performed: No  ____________________________________________   INITIAL IMPRESSION / ASSESSMENT AND PLAN / ED COURSE Patient has a bottle of lisinopril with him. There is medication in the bottle. He states that he has plenty of medication is just that he forgets to take it. Social worker is present and will reinforce that medication needs to be taken every day. He is encouraged to make an appointment with his PCP for follow-up. He is reassured that his blood pressure is greatly improved over what the social worker explained per home health nurse.  ____________________________________________   FINAL CLINICAL IMPRESSION(S) / ED DIAGNOSES  Final diagnoses:  Essential hypertension  Noncompliance with medications     ED Discharge Orders    None       Note:  This document was prepared using Dragon voice recognition software and may include unintentional dictation errors.    Tommi RumpsSummers, Grissel Tyrell L, PA-C 01/11/17 1646    Emily FilbertWilliams, Jonathan E, MD 01/11/17 [phone]

## 2017-01-11 NOTE — Discharge Instructions (Signed)
Take lisinopril every day and follow up with your primary care doctor. Today your blood pressure was 134/83.

## 2017-01-11 NOTE — ED Triage Notes (Signed)
Pt here because home nurse came and checked bp and was elevated. Pt has not been taking his BP medication but cannot given RN a reason.  BP WNL in triage.  Ambulatory without difficulty.  Here with his Child psychotherapistsocial worker but lives alone and responsible for own care.

## 2017-03-20 ENCOUNTER — Ambulatory Visit (INDEPENDENT_AMBULATORY_CARE_PROVIDER_SITE_OTHER): Payer: Medicare Other | Admitting: Internal Medicine

## 2017-03-20 ENCOUNTER — Encounter: Payer: Self-pay | Admitting: Internal Medicine

## 2017-03-20 VITALS — BP 203/146 | HR 87 | Resp 16 | Ht 76.0 in | Wt 165.2 lb

## 2017-03-20 DIAGNOSIS — F332 Major depressive disorder, recurrent severe without psychotic features: Secondary | ICD-10-CM

## 2017-03-20 DIAGNOSIS — Z91199 Patient's noncompliance with other medical treatment and regimen due to unspecified reason: Secondary | ICD-10-CM

## 2017-03-20 DIAGNOSIS — Z9119 Patient's noncompliance with other medical treatment and regimen: Secondary | ICD-10-CM

## 2017-03-20 DIAGNOSIS — I161 Hypertensive emergency: Secondary | ICD-10-CM | POA: Diagnosis not present

## 2017-03-20 MED ORDER — TRAZODONE HCL 100 MG PO TABS: ORAL_TABLET | ORAL | 3 refills | Status: DC

## 2017-03-20 MED ORDER — CARVEDILOL 12.5 MG PO TABS: 12.5000 mg | ORAL_TABLET | Freq: Two times a day (BID) | ORAL | 3 refills | Status: DC

## 2017-03-20 MED ORDER — LISINOPRIL 40 MG PO TABS: 40.0000 mg | ORAL_TABLET | Freq: Every day | ORAL | 3 refills | Status: DC

## 2017-03-20 MED ORDER — FLUOXETINE HCL 40 MG PO CAPS: ORAL_CAPSULE | ORAL | 3 refills | Status: DC

## 2017-03-20 MED ORDER — AMLODIPINE BESYLATE 10 MG PO TABS
10.0000 mg | ORAL_TABLET | Freq: Every day | ORAL | 3 refills | Status: DC
Start: 2017-03-20 — End: 2017-07-04

## 2017-03-20 NOTE — Progress Notes (Signed)
[facility] [address] [place], [place] [address]  Internal MEDICINE  Office Visit Note  Patient Name: Gerald Martin  [id number]  [id number]  Date of Service: 04/24/2017  Chief Complaint  Patient presents with  . Hypertension    not been taking his medications like he should    HPI  Pt is here for routine follow up. He is not taking his medications. He is non adherent to therapy and to follow up app   Current Medication: Outpatient Encounter Medications as of 03/20/2017  Medication Sig  . acetaminophen (TYLENOL) 325 MG tablet Take 2 tablets (650 mg total) by mouth every 6 (six) hours as needed for mild pain (or Fever >/= 101).  . carvedilol (COREG) 12.5 MG tablet Take 1 tablet (12.5 mg total) by mouth 2 (two) times daily with a meal.  . FLUoxetine (PROZAC) 40 MG capsule Take one tab po qd  . Ipratropium-Albuterol (COMBIVENT RESPIMAT) 20-100 MCG/ACT AERS respimat Inhale 1 puff into the lungs 4 (four) times daily.  Marland Kitchen lisinopril-hydrochlorothiazide (PRINZIDE,ZESTORETIC) 20-12.5 MG tablet Take 2 tablets by mouth daily.  Marland Kitchen LORazepam (ATIVAN) 1 MG tablet Take by mouth.  . QUEtiapine (SEROQUEL) 100 MG tablet   . ranitidine (ZANTAC) 150 MG tablet Take 150 mg by mouth 2 (two) times daily.  Marland Kitchen tiotropium (SPIRIVA) 18 MCG inhalation capsule Place 18 mcg into inhaler and inhale daily.  . traMADol (ULTRAM) 50 MG tablet Take by mouth.  . traZODone (DESYREL) 100 MG tablet Take one tab po qhs for sleep  . [DISCONTINUED] carvedilol (COREG) 12.5 MG tablet Take 1 tablet (12.5 mg total) by mouth 2 (two) times daily with a meal.  . [DISCONTINUED] FLUoxetine (PROZAC) 20 MG capsule Take 3 capsules (60 mg total) by mouth daily.  . [DISCONTINUED] traZODone (DESYREL) 50 MG tablet Take 50 mg by mouth at bedtime as needed for sleep.  Marland Kitchen amLODipine (NORVASC) 10 MG tablet Take 1 tablet (10 mg total) by mouth daily.  . folic acid (FOLVITE) 1 MG tablet Take 1 tablet (1 mg total) by mouth daily.  (Patient not taking: Reported on 03/20/2017)  . lisinopril (PRINIVIL,ZESTRIL) 40 MG tablet Take 1 tablet (40 mg total) by mouth daily.  . Multiple Vitamin (MULTIVITAMIN WITH MINERALS) TABS tablet Take 1 tablet by mouth daily with supper. (Patient not taking: Reported on 03/20/2017)  . [DISCONTINUED] IPRATROPIUM-ALBUTEROL IN Inhale into the lungs.   No facility-administered encounter medications on file as of 03/20/2017.     Surgical History: Past Surgical History:  Procedure Laterality Date  . arm surgery[Other] Left    chain saw injury  . chronic pain    . LEG SURGERY Right     Medical History: Past Medical History:  Diagnosis Date  . Anxiety   . Chronic pain    back  . COPD (chronic obstructive pulmonary disease) (HCC)   . Depression   . GERD (gastroesophageal reflux disease)   . Hyperlipidemia   . Hypertension     Family History: Family History  Problem Relation Age of Onset  . Stroke Mother   . Diabetes Father     Social History   Socioeconomic History  . Marital status: Married    Spouse name: Not on file  . Number of children: Not on file  . Years of education: Not on file  . Highest education level: Not on file  Occupational History  . Not on file  Social Needs  . Financial resource strain: Not on file  . Food insecurity:  Worry: Not on file    Inability: Not on file  . Transportation needs:    Medical: Not on file    Non-medical: Not on file  Tobacco Use  . Smoking status: Current Every Day Smoker    Packs/day: 2.00    Years: 30.00    Pack years: 60.00  . Smokeless tobacco: Never Used  Substance and Sexual Activity  . Alcohol use: Yes  . Drug use: No  . Sexual activity: Not on file  Lifestyle  . Physical activity:    Days per week: Not on file    Minutes per session: Not on file  . Stress: Not on file  Relationships  . Social connections:    Talks on phone: Not on file    Gets together: Not on file    Attends religious service: Not on  file    Active member of club or organization: Not on file    Attends meetings of clubs or organizations: Not on file    Relationship status: Not on file  . Intimate partner violence:    Fear of current or ex partner: Not on file    Emotionally abused: Not on file    Physically abused: Not on file    Forced sexual activity: Not on file  Other Topics Concern  . Not on file  Social History Narrative  . Not on file      Review of Systems  Constitutional: Negative for chills, fatigue and unexpected weight change.  HENT: Positive for postnasal drip. Negative for congestion, rhinorrhea, sneezing and sore throat.   Eyes: Negative for redness.  Respiratory: Negative for cough, chest tightness and shortness of breath.   Cardiovascular: Negative for chest pain and palpitations.  Gastrointestinal: Negative for abdominal pain, constipation, diarrhea, nausea and vomiting.  Genitourinary: Negative for dysuria and frequency.  Musculoskeletal: Negative for arthralgias, back pain, joint swelling and neck pain.  Skin: Negative for rash.  Neurological: Negative.  Negative for tremors and numbness.  Hematological: Negative for adenopathy. Does not bruise/bleed easily.  Psychiatric/Behavioral: Negative for behavioral problems (Depression), sleep disturbance and suicidal ideas. The patient is not nervous/anxious.     Vital Signs: BP (!) 203/146 (BP Location: Left Arm, Patient Position: Sitting)   Pulse 87   Resp 16   Ht 6\' 4"  (1.93 m)   Wt 165 lb 3.2 oz (74.9 kg)   SpO2 98%   BMI 20.11 kg/m    Physical Exam  Constitutional: He is oriented to person, place, and time. He appears well-developed and well-nourished. No distress.  HENT:  Head: Normocephalic and atraumatic.  Mouth/Throat: Oropharynx is clear and moist. No oropharyngeal exudate.  Eyes: Pupils are equal, round, and reactive to light. EOM are normal.  Neck: Normal range of motion. Neck supple. No JVD present. No tracheal deviation  present. No thyromegaly present.  Cardiovascular: Normal rate, regular rhythm and normal heart sounds. Exam reveals no gallop and no friction rub.  No murmur heard. Pulmonary/Chest: Effort normal. No respiratory distress. He has no wheezes. He has no rales. He exhibits no tenderness.  Abdominal: Soft. Bowel sounds are normal.  Musculoskeletal: Normal range of motion.  Lymphadenopathy:    He has no cervical adenopathy.  Neurological: He is alert and oriented to person, place, and time. No cranial nerve deficit.  Skin: Skin is warm and dry. He is not diaphoretic.  Psychiatric: He has a normal mood and affect. His behavior is normal. Judgment and thought content normal.  Assessment/Plan: 1. Hypertensive emergency - Refused to go to ED - carvedilol (COREG) 12.5 MG tablet; Take 1 tablet (12.5 mg total) by mouth 2 (two) times daily with a meal.  Dispense: 180 tablet; Refill: 3 - lisinopril (PRINIVIL,ZESTRIL) 40 MG tablet; Take 1 tablet (40 mg total) by mouth daily.  Dispense: 90 tablet; Refill: 3 - amLODipine (NORVASC) 10 MG tablet; Take 1 tablet (10 mg total) by mouth daily.  Dispense: 90 tablet; Refill: 3  2. Severe episode of recurrent major depressive disorder, without psychotic features (HCC) - FLUoxetine (PROZAC) 40 MG capsule; Take one tab po qd  Dispense: 90 capsule; Refill: 3 - traZODone (DESYREL) 100 MG tablet; Take one tab po qhs for sleep  Dispense: 90 tablet; Refill: 3  3. Noncompliance -Encouraged compliance  General Counseling: Eryc verbalizes understanding of the findings of todays visit and agrees with plan of treatment. I have discussed any further diagnostic evaluation that may be needed or ordered today. We also reviewed his medications today. he has been encouraged to call the office with any questions or concerns that should arise related to todays visit. Encourage compliance  Meds ordered this encounter  Medications  . carvedilol (COREG) 12.5 MG tablet    Sig:  Take 1 tablet (12.5 mg total) by mouth 2 (two) times daily with a meal.    Dispense:  180 tablet    Refill:  3  . FLUoxetine (PROZAC) 40 MG capsule    Sig: Take one tab po qd    Dispense:  90 capsule    Refill:  3  . lisinopril (PRINIVIL,ZESTRIL) 40 MG tablet    Sig: Take 1 tablet (40 mg total) by mouth daily.    Dispense:  90 tablet    Refill:  3  . amLODipine (NORVASC) 10 MG tablet    Sig: Take 1 tablet (10 mg total) by mouth daily.    Dispense:  90 tablet    Refill:  3  . traZODone (DESYREL) 100 MG tablet    Sig: Take one tab po qhs for sleep    Dispense:  90 tablet    Refill:  3    Time spent:25 Minutes   Dr Lyndon CodeFozia M Gwynne Kemnitz Internal medicine

## 2017-05-07 ENCOUNTER — Other Ambulatory Visit: Payer: Self-pay | Admitting: Internal Medicine

## 2017-05-08 NOTE — Telephone Encounter (Signed)
Can you please send his tramadol he was last here in 2/19

## 2017-05-10 ENCOUNTER — Telehealth: Payer: Self-pay

## 2017-05-10 ENCOUNTER — Other Ambulatory Visit: Payer: Self-pay

## 2017-05-10 NOTE — Telephone Encounter (Signed)
lmom to call us back

## 2017-05-10 NOTE — Telephone Encounter (Signed)
lmom to pt to call back and as per dr Welton Flakeskhan we going to change direction tramadol 50mg  1 tab twice a day 60 with 2 after talk to pt

## 2017-05-10 NOTE — Telephone Encounter (Signed)
Its for tramadol and already called in to [facility]

## 2017-05-10 NOTE — Telephone Encounter (Signed)
Error is wrong med

## 2017-07-03 ENCOUNTER — Telehealth: Payer: Self-pay

## 2017-07-04 ENCOUNTER — Other Ambulatory Visit: Payer: Self-pay | Admitting: Internal Medicine

## 2017-07-04 ENCOUNTER — Other Ambulatory Visit: Payer: Self-pay

## 2017-07-04 DIAGNOSIS — F332 Major depressive disorder, recurrent severe without psychotic features: Secondary | ICD-10-CM

## 2017-07-04 DIAGNOSIS — I161 Hypertensive emergency: Secondary | ICD-10-CM

## 2017-07-04 MED ORDER — CARVEDILOL 12.5 MG PO TABS: 12.5000 mg | ORAL_TABLET | Freq: Two times a day (BID) | ORAL | 3 refills | Status: DC

## 2017-07-04 MED ORDER — RANITIDINE HCL 150 MG PO TABS: 150.0000 mg | ORAL_TABLET | Freq: Two times a day (BID) | ORAL | 12 refills | Status: DC

## 2017-07-04 MED ORDER — TRAZODONE HCL 100 MG PO TABS: ORAL_TABLET | ORAL | 3 refills | Status: DC

## 2017-07-04 MED ORDER — AMLODIPINE BESYLATE 10 MG PO TABS: 10.0000 mg | ORAL_TABLET | Freq: Every day | ORAL | 3 refills | Status: DC

## 2017-07-04 MED ORDER — FLUOXETINE HCL 40 MG PO CAPS: ORAL_CAPSULE | ORAL | 3 refills | Status: DC

## 2017-07-04 MED ORDER — TRAMADOL HCL 50 MG PO TABS: 50.0000 mg | ORAL_TABLET | Freq: Three times a day (TID) | ORAL | 3 refills | Status: DC | PRN

## 2017-07-04 MED ORDER — IPRATROPIUM-ALBUTEROL 20-100 MCG/ACT IN AERS: 1.0000 | INHALATION_SPRAY | Freq: Four times a day (QID) | RESPIRATORY_TRACT | 12 refills | Status: DC

## 2017-07-04 MED ORDER — QUETIAPINE FUMARATE 100 MG PO TABS: 100.0000 mg | ORAL_TABLET | Freq: Every day | ORAL | 3 refills | Status: DC

## 2017-07-04 MED ORDER — TIOTROPIUM BROMIDE MONOHYDRATE 18 MCG IN CAPS: 18.0000 ug | ORAL_CAPSULE | Freq: Every day | RESPIRATORY_TRACT | 12 refills | Status: DC

## 2017-07-04 MED ORDER — LISINOPRIL 40 MG PO TABS
40.0000 mg | ORAL_TABLET | Freq: Every day | ORAL | 3 refills | Status: DC
Start: 2017-07-04 — End: 2018-06-07

## 2017-07-04 MED ORDER — LISINOPRIL 40 MG PO TABS: 40.0000 mg | ORAL_TABLET | Freq: Every day | ORAL | 3 refills | Status: DC

## 2017-07-05 ENCOUNTER — Telehealth: Payer: Self-pay

## 2017-07-05 ENCOUNTER — Other Ambulatory Visit: Payer: Self-pay

## 2017-07-05 DIAGNOSIS — I161 Hypertensive emergency: Secondary | ICD-10-CM

## 2017-07-05 DIAGNOSIS — F332 Major depressive disorder, recurrent severe without psychotic features: Secondary | ICD-10-CM

## 2017-07-05 MED ORDER — FLUOXETINE HCL 40 MG PO CAPS: ORAL_CAPSULE | ORAL | 3 refills | Status: DC

## 2017-07-05 MED ORDER — CARVEDILOL 12.5 MG PO TABS: 12.5000 mg | ORAL_TABLET | Freq: Two times a day (BID) | ORAL | 3 refills | Status: DC

## 2017-07-05 NOTE — Telephone Encounter (Signed)
Send med to [facility]

## 2017-07-10 ENCOUNTER — Telehealth: Payer: Self-pay | Admitting: Internal Medicine

## 2017-07-10 NOTE — Telephone Encounter (Signed)
PRIOR AUTH FOR SPIRIVA HAS BEEN APPROVED 03/27/2017-07/05/2018

## 2017-07-18 ENCOUNTER — Ambulatory Visit: Payer: Self-pay | Admitting: Internal Medicine

## 2017-08-30 ENCOUNTER — Emergency Department: Payer: Medicare Other

## 2017-08-30 ENCOUNTER — Encounter: Payer: Self-pay | Admitting: Emergency Medicine

## 2017-08-30 ENCOUNTER — Emergency Department
Admission: EM | Admit: 2017-08-30 | Discharge: 2017-08-30 | Disposition: A | Discharge status: Home / Self Care | Payer: Medicare Other | Attending: Emergency Medicine | Admitting: Emergency Medicine

## 2017-08-30 DIAGNOSIS — S63124A Dislocation of unspecified interphalangeal joint of right thumb, initial encounter: Secondary | ICD-10-CM | POA: Insufficient documentation

## 2017-08-30 DIAGNOSIS — J449 Chronic obstructive pulmonary disease, unspecified: Secondary | ICD-10-CM | POA: Insufficient documentation

## 2017-08-30 DIAGNOSIS — S61011A Laceration without foreign body of right thumb without damage to nail, initial encounter: Secondary | ICD-10-CM | POA: Diagnosis not present

## 2017-08-30 DIAGNOSIS — Y9389 Activity, other specified: Secondary | ICD-10-CM | POA: Insufficient documentation

## 2017-08-30 DIAGNOSIS — S62521B Displaced fracture of distal phalanx of right thumb, initial encounter for open fracture: Secondary | ICD-10-CM | POA: Insufficient documentation

## 2017-08-30 DIAGNOSIS — S6701XA Crushing injury of right thumb, initial encounter: Secondary | ICD-10-CM | POA: Insufficient documentation

## 2017-08-30 DIAGNOSIS — Z79899 Other long term (current) drug therapy: Secondary | ICD-10-CM | POA: Diagnosis not present

## 2017-08-30 DIAGNOSIS — S63279A Dislocation of unspecified interphalangeal joint of unspecified finger, initial encounter: Secondary | ICD-10-CM

## 2017-08-30 DIAGNOSIS — Y929 Unspecified place or not applicable: Secondary | ICD-10-CM | POA: Diagnosis not present

## 2017-08-30 DIAGNOSIS — Y999 Unspecified external cause status: Secondary | ICD-10-CM | POA: Diagnosis not present

## 2017-08-30 DIAGNOSIS — F17210 Nicotine dependence, cigarettes, uncomplicated: Secondary | ICD-10-CM | POA: Diagnosis not present

## 2017-08-30 DIAGNOSIS — W230XXA Caught, crushed, jammed, or pinched between moving objects, initial encounter: Secondary | ICD-10-CM | POA: Insufficient documentation

## 2017-08-30 DIAGNOSIS — I10 Essential (primary) hypertension: Secondary | ICD-10-CM | POA: Diagnosis not present

## 2017-08-30 DIAGNOSIS — S61209A Unspecified open wound of unspecified finger without damage to nail, initial encounter: Secondary | ICD-10-CM

## 2017-08-30 DIAGNOSIS — Z23 Encounter for immunization: Secondary | ICD-10-CM | POA: Insufficient documentation

## 2017-08-30 DIAGNOSIS — S62501B Fracture of unspecified phalanx of right thumb, initial encounter for open fracture: Secondary | ICD-10-CM

## 2017-08-30 MED ORDER — IBUPROFEN 600 MG PO TABS: 600.0000 mg | ORAL_TABLET | Freq: Three times a day (TID) | ORAL | 0 refills | Status: DC | PRN

## 2017-08-30 MED ORDER — AMOXICILLIN-POT CLAVULANATE 875-125 MG PO TABS
1.0000 | ORAL_TABLET | Freq: Once | ORAL | Status: AC
  Administered 2017-08-30: 1 via ORAL
  Filled 2017-08-30: qty 1

## 2017-08-30 MED ORDER — AMOXICILLIN-POT CLAVULANATE 875-125 MG PO TABS
ORAL_TABLET | ORAL | Status: AC
  Filled 2017-08-30: qty 1

## 2017-08-30 MED ORDER — HYDROCODONE-ACETAMINOPHEN 5-325 MG PO TABS: 1.0000 | ORAL_TABLET | Freq: Four times a day (QID) | ORAL | 0 refills | Status: DC | PRN

## 2017-08-30 MED ORDER — AMOXICILLIN-POT CLAVULANATE 875-125 MG PO TABS: 1.0000 | ORAL_TABLET | Freq: Two times a day (BID) | ORAL | 0 refills | Status: AC

## 2017-08-30 MED ORDER — CEFAZOLIN SODIUM-DEXTROSE 1-4 GM/50ML-% IV SOLN
1.0000 g | Freq: Once | INTRAVENOUS | Status: DC
  Filled 2017-08-30: qty 50

## 2017-08-30 MED ORDER — GENTAMICIN SULFATE 40 MG/ML IJ SOLN
2.5000 mg/kg | Freq: Once | INTRAVENOUS | Status: DC
  Filled 2017-08-30: qty 4.75

## 2017-08-30 MED ORDER — BUPIVACAINE HCL (PF) 0.5 % IJ SOLN
30.0000 mL | Freq: Once | INTRAMUSCULAR | Status: AC
  Administered 2017-08-30: 30 mL
  Filled 2017-08-30: qty 30

## 2017-08-30 MED ORDER — TETANUS-DIPHTH-ACELL PERTUSSIS 5-2.5-18.5 LF-MCG/0.5 IM SUSP
0.5000 mL | Freq: Once | INTRAMUSCULAR | Status: AC
  Administered 2017-08-30: 0.5 mL via INTRAMUSCULAR
  Filled 2017-08-30: qty 0.5

## 2017-08-30 NOTE — Discharge Instructions (Signed)
DO NOT SMOKE UNTIL YOUR THUMB IS FULLY HEALED.  Please keep your dressing on at all times until you follow up with your Ortho within 2 days for a recheck.  Return to the ED sooner for any concerns.  It was a pleasure to take care of you today, and thank you for coming to our emergency department.  If you have any questions or concerns before leaving please ask the nurse to grab me and I'm more than happy to go through your aftercare instructions again.  If you were prescribed any opioid pain medication today such as Norco, Vicodin, Percocet, morphine, hydrocodone, or oxycodone please make sure you do not drive when you are taking this medication as it can alter your ability to drive safely.  If you have any concerns once you are home that you are not improving or are in fact getting worse before you can make it to your follow-up appointment, please do not hesitate to call [phone] and come back for further evaluation.  Merrily BrittleNeil Rebeckah Masih, MD  Results for orders placed or performed during the hospital encounter of 07/15/15  Comprehensive metabolic panel  Result Value Ref Range   Sodium 137 135 - 145 mmol/L   Potassium 4.1 3.5 - 5.1 mmol/L   Chloride 104 101 - 111 mmol/L   CO2 26 22 - 32 mmol/L   Glucose, Bld 87 65 - 99 mg/dL   BUN 11 6 - 20 mg/dL   Creatinine, Ser 1.610.86 0.61 - 1.24 mg/dL   Calcium 8.6 (L) 8.9 - 10.3 mg/dL   Total Protein 6.8 6.5 - 8.1 g/dL   Albumin 3.6 3.5 - 5.0 g/dL   AST 25 15 - 41 U/L   ALT 28 17 - 63 U/L   Alkaline Phosphatase 77 38 - 126 U/L   Total Bilirubin 0.6 0.3 - 1.2 mg/dL   GFR calc non Af Amer >60 >60 mL/min   GFR calc Af Amer >60 >60 mL/min   Anion gap 7 5 - 15  Ethanol  Result Value Ref Range   Alcohol, Ethyl (B) <5 <5 mg/dL  Salicylate level  Result Value Ref Range   Salicylate Lvl <4.0 2.8 - 30.0 mg/dL  Acetaminophen level  Result Value Ref Range   Acetaminophen (Tylenol), Serum <10 (L) 10 - 30 ug/mL  cbc  Result Value Ref Range   WBC 7.0 3.8 - 10.6  K/uL   RBC 4.57 4.40 - 5.90 MIL/uL   Hemoglobin 14.3 13.0 - 18.0 g/dL   HCT 09.641.7 04.540.0 - 40.952.0 %   MCV 91.1 80.0 - 100.0 fL   MCH 31.2 26.0 - 34.0 pg   MCHC 34.2 32.0 - 36.0 g/dL   RDW 81.116.1 (H) 91.411.5 - 78.214.5 %   Platelets 248 150 - 440 K/uL  Urine Drug Screen, Qualitative  Result Value Ref Range   Tricyclic, Ur Screen NONE DETECTED NONE DETECTED   Amphetamines, Ur Screen NONE DETECTED NONE DETECTED   MDMA (Ecstasy)Ur Screen NONE DETECTED NONE DETECTED   Cocaine Metabolite,Ur [place] NONE DETECTED NONE DETECTED   Opiate, Ur Screen NONE DETECTED NONE DETECTED   Phencyclidine (PCP) Ur S NONE DETECTED NONE DETECTED   Cannabinoid 50 Ng, Ur [place] NONE DETECTED NONE DETECTED   Barbiturates, Ur Screen NONE DETECTED NONE DETECTED   Benzodiazepine, Ur Scrn NONE DETECTED NONE DETECTED   Methadone Scn, Ur NONE DETECTED NONE DETECTED   Dg Finger Thumb Right  Result Date: 08/30/2017 CLINICAL DATA:  Pain following crush type injury EXAM: RIGHT THUMB  2+V COMPARISON:  None. FINDINGS: Frontal, oblique, and lateral views were obtained. There is a comminuted fracture of the distal aspect of first distal phalanx with fracture fragments in overall near anatomic alignment. There is extensive soft tissue injury along the dorsal aspect of the first digit at the IP joint level. Soft tissue air extends to the level of the first IP joint. There is an avulsion arising from the dorsal proximal aspect of the first distal phalanx. There is also an avulsion seen along the medial aspect of the distal aspect of the first proximal phalanx near the laceration. No frank dislocation evident. There is a bone island in the first distal phalanx. IMPRESSION: 1. Comminuted fracture of the distal aspect of first distal phalanx with several mildly displaced fracture fragments. 2. There is a soft tissue injury extending into the dorsal aspect of first IP joint. There are avulsion is a arising from the distal aspect of the first proximal phalanx and  the proximal aspect first distal phalanx. 3. No gross dislocation evident. No appreciable underlying arthropathy. Bone island noted in first distal phalanx. Electronically Signed   By: Bretta Bang III M.D.   On: 08/30/2017 14:58

## 2017-08-30 NOTE — ED Provider Notes (Signed)
[facility] Emergency Department Provider Note  ____________________________________________   First MD Initiated Contact with Patient 08/30/17 1410     (approximate)  I have reviewed the triage vital signs and the nursing notes.   HISTORY  Chief Complaint Finger Injury    HPI Gerald Martin is a 52 y.o. male who self presents to the emergency department with a crush injury to his right thumb that occurred roughly half hour ago.  Tetanus is not up-to-date.  He is unemployed.  He is a smoker.  He was helping a friend move a refrigerator and he got his thumb crushed between a refrigerator and a metal bar.  He suffered sudden onset severe pain that has been constant ever since.  Worse with movement somewhat improved when holding the thumb still.    Past Medical History:  Diagnosis Date  . Anxiety   . Chronic pain    back  . COPD (chronic obstructive pulmonary disease) (HCC)   . Depression   . GERD (gastroesophageal reflux disease)   . Hyperlipidemia   . Hypertension     Patient Active Problem List   Diagnosis Date Noted  . Severe recurrent major depression without psychotic features (HCC) 05/20/2015  . Noncompliance 05/20/2015  . Grief 05/20/2015  . Adjustment disorder with depressed mood 05/20/2015  . Accelerated hypertension 05/19/2015  . Benign essential hypertension 05/07/2014  . Esophageal reflux 05/07/2014  . Iron deficiency anemia, unspecified 05/07/2014  . Major depressive disorder 05/07/2014  . Shortness of breath 05/07/2014  . Tobacco abuse 05/07/2014  . Calculus of gallbladder with other cholecystitis, without mention of obstruction 10/07/2012  . Gallstones 09/17/2012    Past Surgical History:  Procedure Laterality Date  . arm surgery[Other] Left    chain saw injury  . chronic pain    . LEG SURGERY Right     Prior to Admission medications   Medication Sig Start Date End Date Taking? Authorizing Provider  amLODipine (NORVASC)  10 MG tablet Take 1 tablet (10 mg total) by mouth daily. 07/04/17   Lyndon CodeKhan, Fozia M, MD  amoxicillin-clavulanate (AUGMENTIN) 875-125 MG tablet Take 1 tablet by mouth 2 (two) times daily for 14 days. 08/30/17 09/13/17  Merrily Brittleifenbark, Murphy Bundick, MD  carvedilol (COREG) 12.5 MG tablet Take 1 tablet (12.5 mg total) by mouth 2 (two) times daily with a meal. 07/05/17   Lyndon CodeKhan, Fozia M, MD  FLUoxetine [facility](PROZAC) 40 MG capsule Take one tab po qd 07/05/17   Lyndon CodeKhan, Fozia M, MD  HYDROcodone-acetaminophen (NORCO) 5-325 MG tablet Take 1 tablet by mouth every 6 (six) hours as needed for up to 15 doses for severe pain. 08/30/17   Merrily Brittleifenbark, Jaciel Diem, MD  ibuprofen (ADVIL,MOTRIN) 600 MG tablet Take 1 tablet (600 mg total) by mouth every 8 (eight) hours as needed. 08/30/17   Merrily Brittleifenbark, Gabriele Loveland, MD  Ipratropium-Albuterol (COMBIVENT RESPIMAT) 20-100 MCG/ACT AERS respimat Inhale 1 puff into the lungs 4 (four) times daily. 07/04/17   Lyndon CodeKhan, Fozia M, MD  lisinopril (PRINIVIL,ZESTRIL) 40 MG tablet Take 1 tablet (40 mg total) by mouth daily. 07/04/17   Lyndon CodeKhan, Fozia M, MD  QUEtiapine (SEROQUEL) 100 MG tablet Take 1 tablet (100 mg total) by mouth at bedtime. 07/04/17   Lyndon CodeKhan, Fozia M, MD  ranitidine (ZANTAC) 150 MG tablet Take 1 tablet (150 mg total) by mouth 2 (two) times daily. 07/04/17   Lyndon CodeKhan, Fozia M, MD  tiotropium (SPIRIVA) 18 MCG inhalation capsule Place 1 capsule (18 mcg total) into inhaler and inhale daily. 07/04/17  Lyndon Code, MD  traMADol (ULTRAM) 50 MG tablet Take 1 tablet (50 mg total) by mouth 3 (three) times daily as needed. for pain 07/04/17   Lyndon Code, MD  traZODone (DESYREL) 100 MG tablet Take one tab po qhs for sleep 07/04/17   Lyndon Code, MD    Allergies Patient has no known allergies.  Family History  Problem Relation Age of Onset  . Stroke Mother   . Diabetes Father     Social History Social History   Tobacco Use  . Smoking status: Current Every Day Smoker    Packs/day: 2.00    Years: 30.00    Pack years: 60.00  .  Smokeless tobacco: Never Used  Substance Use Topics  . Alcohol use: Yes  . Drug use: No    Review of Systems Constitutional: No fever/chills Cardiovascular: Denies chest pain. Respiratory: Denies shortness of breath. Gastrointestinal: No abdominal pain.  Positive for nausea Musculoskeletal: Positive for finger pain Neurological: Negative for headaches   ____________________________________________   PHYSICAL EXAM:  VITAL SIGNS: ED Triage Vitals  Enc Vitals Group     BP 08/30/17 1358 (!) 183/112     Pulse Rate 08/30/17 1358 71     Resp 08/30/17 1358 18     Temp 08/30/17 1358 98.9 F (37.2 C)     Temp Source 08/30/17 1358 Oral     SpO2 08/30/17 1358 96 %     Weight 08/30/17 1359 170 lb (77.1 kg)     Height 08/30/17 1359 6\' 4"  (1.93 m)     Head Circumference --      Peak Flow --      Pain Score 08/30/17 1359 10     Pain Loc --      Pain Edu? --      Excl. in GC? --     Constitutional: Alert and oriented x4 appears extremely uncomfortable holding his hand Head: Atraumatic. Nose: No congestion/rhinnorhea. Mouth/Throat: No trismus Neck: No stridor.   Cardiovascular: Regular rate and rhythm Respiratory: Normal respiratory effort.  No retractions. MSK: Right thumb with large defect to the dorsal aspect with exposed bone and extensor tendon.  Distal tip with decreased sensation and 4-second capillary refill.  Unable to appreciate a pulse ox of the distal tip.  Extensor mechanism completely out Neurologic:  Normal speech and language. No gross focal neurologic deficits are appreciated.  Skin:  Skin is warm, dry and intact. No rash noted.    ____________________________________________  LABS (all labs ordered are listed, but only abnormal results are displayed)  Labs Reviewed - No data to display   __________________________________________  EKG   ____________________________________________  RADIOLOGY  X-ray of the right thumb reviewed by me with comminuted  open fracture ____________________________________________   DIFFERENTIAL includes but not limited to  Laceration, fracture, open fracture, open joint   PROCEDURES  Procedure(s) performed: Yes  .Marland KitchenLaceration Repair Date/Time: 08/30/2017 4:00 PM Performed by: Merrily Brittle, MD Authorized by: Merrily Brittle, MD   Consent:    Consent obtained:  Verbal   Consent given by:  Patient   Risks discussed:  Infection, pain, retained foreign body, poor cosmetic result and poor wound healing Anesthesia (see MAR for exact dosages):    Anesthesia method:  Nerve block Laceration details:    Location:  Finger   Finger location:  R thumb   Length (cm):  8 Repair type:    Repair type:  Complex Pre-procedure details:    Preparation:  Patient was prepped and  draped in usual sterile fashion and imaging obtained to evaluate for foreign bodies Exploration:    Limited defect created (wound extended): yes     Hemostasis achieved with:  Direct pressure and tourniquet   Wound exploration: wound explored through full range of motion and entire depth of wound probed and visualized     Wound extent: areolar tissue violated, fascia violated, foreign bodies/material, muscle damage, nerve damage, tendon damage, underlying fracture and vascular damage     Tendon damage location:  Upper extremity   Upper extremity tendon damage location:  Finger extensor   Tendon damage extent:  Complete transection   Tendon repair plan:  Refer for evaluation   Contaminated: yes   Treatment:    Area cleansed with:  Saline   Amount of cleaning:  Extensive   Irrigation solution:  Sterile saline and sterile water   Irrigation method:  Syringe and pressure wash   Visualized foreign bodies/material removed: no     Debridement:  Minimal   Undermining:  None   Scar revision: no   Skin repair:    Repair method:  Sutures   Suture size:  4-0   Suture material:  Nylon   Number of sutures:  12 Approximation:     Approximation:  Loose Post-procedure details:    Dressing:  Non-adherent dressing   Patient tolerance of procedure:  Tolerated well, no immediate complications .Nerve Block Date/Time: 08/30/2017 4:00 PM Performed by: Merrily Brittle, MD Authorized by: Merrily Brittle, MD   Consent:    Consent obtained:  Verbal   Consent given by:  Patient   Risks discussed:  Infection, intravenous injection, bleeding, pain and unsuccessful block   Alternatives discussed:  Alternative treatment Indications:    Indications:  Pain relief and procedural anesthesia Location:    Body area:  Upper extremity   Upper extremity nerve blocked: Digital block on the right thumb.   Laterality:  Right Pre-procedure details:    Skin preparation:  Alcohol Skin anesthesia (see MAR for exact dosages):    Skin anesthesia method:  None Procedure details (see MAR for exact dosages):    Block needle gauge:  25 G   Anesthetic injected:  Bupivacaine 0.5% w/o epi   Steroid injected:  None   Additive injected:  None   Injection procedure:  Anatomic landmarks identified, anatomic landmarks palpated, incremental injection and negative aspiration for blood   Paresthesia:  None Post-procedure details:    Dressing:  None   Outcome:  Anesthesia achieved   Patient tolerance of procedure:  Tolerated well, no immediate complications .Splint Application Date/Time: 09/01/2017 3:51 AM Performed by: Merrily Brittle, MD Authorized by: Merrily Brittle, MD   Consent:    Consent obtained:  Verbal   Consent given by:  Patient   Alternatives discussed:  Alternative treatment Pre-procedure details:    Sensation:  Numbness, decreased circulation and weakness Procedure details:    Laterality:  Right   Location:  Finger   Finger:  R thumb   Splint type: Finger splint in extension.   Supplies:  Aluminum splint Post-procedure details:    Pain:  Unchanged   Sensation:  Decreased circulation and numbness   Patient tolerance of  procedure:  Tolerated well, no immediate complications    Critical Care performed: No  ____________________________________________   INITIAL IMPRESSION / ASSESSMENT AND PLAN / ED COURSE  Pertinent labs & imaging results that were available during my care of the patient were reviewed by me and considered in my medical decision making (see  chart for details).   As part of my medical decision making, I reviewed the following data within the electronic MEDICAL RECORD NUMBER History obtained from family if available, nursing notes, old chart and ekg, as well as notes from prior ED visits.  The patient comes to the emergency department with an obvious open crush injury to the dorsal aspect of his right dominant thumb.  I blocked his thumb with 0.5% bupivacaine and a digital block achieving complete anesthesia.  I then discussed the case with on-call orthopedic surgeon Dr. Odis Luster who agrees with extensive washout and loose repair with splinting in extension and covering with Augmentin.  This is a crush injury to the distal digit in a smoker so he is not a candidate for revascularization even though the distal tip of his thumb is somewhat dusky.  The wound was washed out with 1.5 L of normal saline based 12 nylon sutures loosely closing the wound and splinted in extension.  First dose of Augmentin given here and I will discharge him home with 24-hour orthopedic follow-up.  Patient verbalized understanding agreement the plan.      ____________________________________________   FINAL CLINICAL IMPRESSION(S) / ED DIAGNOSES  Final diagnoses:  Open displaced fracture of phalanx of right thumb, unspecified phalanx, initial encounter  Open dislocation of interphalangeal joint of finger      NEW MEDICATIONS STARTED DURING THIS VISIT:  Discharge Medication List as of 08/30/2017  3:54 PM    START taking these medications   Details  amoxicillin-clavulanate (AUGMENTIN) 875-125 MG tablet Take 1 tablet by  mouth 2 (two) times daily for 14 days., Starting Thu 08/30/2017, Until Thu 09/13/2017, Normal    HYDROcodone-acetaminophen (NORCO) 5-325 MG tablet Take 1 tablet by mouth every 6 (six) hours as needed for up to 15 doses for severe pain., Starting Thu 08/30/2017, Print    ibuprofen (ADVIL,MOTRIN) 600 MG tablet Take 1 tablet (600 mg total) by mouth every 8 (eight) hours as needed., Starting Thu 08/30/2017, Print         Note:  This document was prepared using Dragon voice recognition software and may include unintentional dictation errors.      Merrily Brittle, MD 09/01/17 [phone]

## 2017-08-30 NOTE — ED Triage Notes (Signed)
Pt arrived with injury to right thumb. Pt was moving refrigerator and in process compressed against right thumb causing near ambulation. Bleeding controlled.

## 2017-08-30 NOTE — ED Notes (Signed)
Right thumb pain 

## 2017-09-03 DIAGNOSIS — S62524A Nondisplaced fracture of distal phalanx of right thumb, initial encounter for closed fracture: Secondary | ICD-10-CM | POA: Diagnosis not present

## 2017-09-10 ENCOUNTER — Other Ambulatory Visit: Payer: Self-pay | Admitting: Internal Medicine

## 2017-09-11 NOTE — Telephone Encounter (Signed)
Is he good with is f/u. If not he needs to be seen

## 2017-09-12 ENCOUNTER — Telehealth: Payer: Self-pay

## 2017-09-12 NOTE — Telephone Encounter (Signed)
Called patient and left voicemail letting him know that we need to get him scheduled for an appointment to follow up on medications and asked that he call back as soon as possible

## 2017-09-13 DIAGNOSIS — S62524A Nondisplaced fracture of distal phalanx of right thumb, initial encounter for closed fracture: Secondary | ICD-10-CM | POA: Diagnosis not present

## 2017-09-20 DIAGNOSIS — S62524A Nondisplaced fracture of distal phalanx of right thumb, initial encounter for closed fracture: Secondary | ICD-10-CM | POA: Diagnosis not present

## 2017-10-01 ENCOUNTER — Other Ambulatory Visit: Payer: Self-pay | Admitting: Internal Medicine

## 2017-10-04 ENCOUNTER — Other Ambulatory Visit: Payer: Self-pay | Admitting: Internal Medicine

## 2017-10-04 DIAGNOSIS — S62524D Nondisplaced fracture of distal phalanx of right thumb, subsequent encounter for fracture with routine healing: Secondary | ICD-10-CM | POA: Diagnosis not present

## 2017-10-17 ENCOUNTER — Telehealth: Payer: Self-pay

## 2017-10-17 NOTE — Telephone Encounter (Signed)
Send reminder to dr khan 

## 2017-11-06 ENCOUNTER — Other Ambulatory Visit: Payer: Self-pay | Admitting: Internal Medicine

## 2017-11-30 ENCOUNTER — Encounter: Payer: Self-pay | Admitting: Adult Health

## 2017-11-30 ENCOUNTER — Ambulatory Visit (INDEPENDENT_AMBULATORY_CARE_PROVIDER_SITE_OTHER): Payer: Medicare Other | Admitting: Adult Health

## 2017-11-30 VITALS — BP 160/92 | HR 61 | Resp 16 | Ht 76.0 in | Wt 169.0 lb

## 2017-11-30 DIAGNOSIS — I10 Essential (primary) hypertension: Secondary | ICD-10-CM

## 2017-11-30 DIAGNOSIS — F4321 Adjustment disorder with depressed mood: Secondary | ICD-10-CM

## 2017-11-30 DIAGNOSIS — Z9119 Patient's noncompliance with other medical treatment and regimen: Secondary | ICD-10-CM

## 2017-11-30 DIAGNOSIS — F332 Major depressive disorder, recurrent severe without psychotic features: Secondary | ICD-10-CM

## 2017-11-30 DIAGNOSIS — F17219 Nicotine dependence, cigarettes, with unspecified nicotine-induced disorders: Secondary | ICD-10-CM | POA: Diagnosis not present

## 2017-11-30 DIAGNOSIS — Z91199 Patient's noncompliance with other medical treatment and regimen due to unspecified reason: Secondary | ICD-10-CM

## 2017-11-30 NOTE — Progress Notes (Signed)
[facility] [address] [place], [place] [address]  Internal MEDICINE  Office Visit Note  Patient Name: Gerald Martin  [id number]  [id number]  Date of Service: 11/30/2017  Chief Complaint  Patient presents with  . Anxiety  . Hypertension  . Hyperlipidemia  . other    as per pt he stopped seraquel on his own    HPI Patient is here for follow-up on anxiety, hypertension, hyperlipidemia and depression.  He is hypertensive at today's visit he states he is taking his medications however the home health aide who is with him and myself are doubtful of this as his bubble packs seems to be off.  No one witnesses him taking his medications and is hard to say if he actually is taking them.  He has severe depression and it is likely that he may just be from the medications away or forgetting to take them altogether.  He has home health aide that comes in a couple of hours a few times a week.  She helps him get to appointments and tries to keep him company however his depression is so severe that he does not appear to get much out of it.    Current Medication: Outpatient Encounter Medications as of 11/30/2017  Medication Sig  . amLODipine (NORVASC) 10 MG tablet Take 1 tablet (10 mg total) by mouth daily.  . carvedilol (COREG) 12.5 MG tablet Take 1 tablet (12.5 mg total) by mouth 2 (two) times daily with a meal.  . FLUoxetine (PROZAC) 40 MG capsule Take one tab po qd  . ibuprofen (ADVIL,MOTRIN) 600 MG tablet Take 1 tablet (600 mg total) by mouth every 8 (eight) hours as needed.  . Ipratropium-Albuterol (COMBIVENT RESPIMAT) 20-100 MCG/ACT AERS respimat Inhale 1 puff into the lungs 4 (four) times daily.  Marland Kitchen lisinopril (PRINIVIL,ZESTRIL) 40 MG tablet Take 1 tablet (40 mg total) by mouth daily.  . ranitidine (ZANTAC) 150 MG tablet Take 1 tablet (150 mg total) by mouth 2 (two) times daily.  Marland Kitchen tiotropium (SPIRIVA) 18 MCG inhalation capsule Place 1 capsule (18 mcg total) into inhaler and  inhale daily.  . traMADol (ULTRAM) 50 MG tablet Take 1 tablet (50 mg total) by mouth 3 (three) times daily as needed. for pain  . traZODone (DESYREL) 100 MG tablet Take one tab po qhs for sleep  . HYDROcodone-acetaminophen (NORCO) 5-325 MG tablet Take 1 tablet by mouth every 6 (six) hours as needed for up to 15 doses for severe pain. (Patient not taking: Reported on 11/30/2017)  . QUEtiapine (SEROQUEL) 100 MG tablet Take 1 tablet (100 mg total) by mouth at bedtime. (Patient not taking: Reported on 11/30/2017)   No facility-administered encounter medications on file as of 11/30/2017.     Surgical History: Past Surgical History:  Procedure Laterality Date  . arm surgery[Other] Left    chain saw injury  . chronic pain    . LEG SURGERY Right     Medical History: Past Medical History:  Diagnosis Date  . Anxiety   . Chronic pain    back  . COPD (chronic obstructive pulmonary disease) (HCC)   . Depression   . GERD (gastroesophageal reflux disease)   . Hyperlipidemia   . Hypertension     Family History: Family History  Problem Relation Age of Onset  . Stroke Mother   . Diabetes Father     Social History   Socioeconomic History  . Marital status: Single    Spouse name: Not on file  .  Number of children: Not on file  . Years of education: Not on file  . Highest education level: Not on file  Occupational History  . Not on file  Social Needs  . Financial resource strain: Not on file  . Food insecurity:    Worry: Not on file    Inability: Not on file  . Transportation needs:    Medical: Not on file    Non-medical: Not on file  Tobacco Use  . Smoking status: Current Every Day Smoker    Packs/day: 2.00    Years: 30.00    Pack years: 60.00  . Smokeless tobacco: Never Used  Substance and Sexual Activity  . Alcohol use: Yes  . Drug use: No  . Sexual activity: Not on file  Lifestyle  . Physical activity:    Days per week: Not on file    Minutes per session: Not on file   . Stress: Not on file  Relationships  . Social connections:    Talks on phone: Not on file    Gets together: Not on file    Attends religious service: Not on file    Active member of club or organization: Not on file    Attends meetings of clubs or organizations: Not on file    Relationship status: Not on file  . Intimate partner violence:    Fear of current or ex partner: Not on file    Emotionally abused: Not on file    Physically abused: Not on file    Forced sexual activity: Not on file  Other Topics Concern  . Not on file  Social History Narrative  . Not on file      Review of Systems  Constitutional: Negative.  Negative for chills, fatigue and unexpected weight change.  HENT: Negative.  Negative for congestion, rhinorrhea, sneezing and sore throat.   Eyes: Negative for redness.  Respiratory: Negative.  Negative for cough, chest tightness and shortness of breath.   Cardiovascular: Negative.  Negative for chest pain and palpitations.  Gastrointestinal: Negative.  Negative for abdominal pain, constipation, diarrhea, nausea and vomiting.  Endocrine: Negative.   Genitourinary: Negative.  Negative for dysuria and frequency.  Musculoskeletal: Negative.  Negative for arthralgias, back pain, joint swelling and neck pain.  Skin: Negative.  Negative for rash.  Allergic/Immunologic: Negative.   Neurological: Negative.  Negative for tremors and numbness.  Hematological: Negative for adenopathy. Does not bruise/bleed easily.  Psychiatric/Behavioral: Negative.  Negative for behavioral problems, sleep disturbance and suicidal ideas. The patient is not nervous/anxious.     Vital Signs: BP (!) 160/92 (BP Location: Left Arm, Patient Position: Sitting, Cuff Size: Large)   Pulse 61   Resp 16   Ht 6\' 4"  (1.93 m)   Wt 169 lb (76.7 kg)   SpO2 99%   BMI 20.57 kg/m    Physical Exam  Constitutional: He is oriented to person, place, and time. He appears well-developed and  well-nourished. No distress.  HENT:  Head: Normocephalic and atraumatic.  Mouth/Throat: Oropharynx is clear and moist. No oropharyngeal exudate.  Eyes: Pupils are equal, round, and reactive to light. EOM are normal.  Neck: Normal range of motion. Neck supple. No JVD present. No tracheal deviation present. No thyromegaly present.  Cardiovascular: Normal rate, regular rhythm and normal heart sounds. Exam reveals no gallop and no friction rub.  No murmur heard. Pulmonary/Chest: Effort normal and breath sounds normal. No respiratory distress. He has no wheezes. He has no rales. He exhibits no  tenderness.  Abdominal: Soft. There is no tenderness. There is no guarding.  Musculoskeletal: Normal range of motion.  Lymphadenopathy:    He has no cervical adenopathy.  Neurological: He is alert and oriented to person, place, and time. No cranial nerve deficit.  Skin: Skin is warm and dry. He is not diaphoretic.  Psychiatric: He has a normal mood and affect. His behavior is normal. Judgment and thought content normal.  Nursing note and vitals reviewed.  Assessment/Plan: 1. Essential hypertension Patient hypertensive at this visit.  He is currently on 2 blood pressure medications however his compliance is in question.  Patient's medications are supplied to him the bubble pack however his days appear off and I am not certain more is his home health aide certain that he is taking medications.  2. Severe recurrent major depression without psychotic features [facility]) Patient is severely depressed and now has stopped taking his Seroquel on his own him.  His home health aide who is with him in the visit reports they are trying to get him into [facility] to help monitor his depression and medications.  3. Grief Patient suffers from tremendous grief after his girlfriend died a couple of years ago very tragically and he has yet to cope or recover from this.  4. Noncompliance Patient has long history of noncompliance and  again looking at his bubble pack medications I am not sure that he is taking the medication since there is no other to witness it.  5. Cigarette nicotine dependence with nicotine-induced disorder Patient reports he smokes 3 to 4 packs of cigarettes per day.  Encouraged him that he should cut down and his goal should be 1 pack a day however he does not appear interested in that. Smoking cessation counseling: 1. Pt acknowledges the risks of long term smoking, she will try to quite smoking. 2. Options for different medications including nicotine products, chewing gum, patch etc, Wellbutrin and Chantix is discussed 3. Goal and date of compete cessation is discussed 4. Total time spent in smoking cessation is 15 min.   General Counseling: Lenora Boys understanding of the findings of todays visit and agrees with plan of treatment. I have discussed any further diagnostic evaluation that may be needed or ordered today. We also reviewed his medications today. he has been encouraged to call the office with any questions or concerns that should arise related to todays visit.    No orders of the defined types were placed in this encounter.   No orders of the defined types were placed in this encounter.   Time spent: 25 Minutes   This patient was seen by Blima Ledger AGNP-C in Collaboration with Dr Lyndon Code as a part of collaborative care agreement     Johnna Acosta AGNP-C Internal medicine

## 2017-11-30 NOTE — Patient Instructions (Signed)

## 2018-03-04 ENCOUNTER — Ambulatory Visit: Payer: Self-pay | Admitting: Nurse Practitioner

## 2018-03-08 ENCOUNTER — Ambulatory Visit
Admission: RE | Admit: 2018-03-08 | Discharge: 2018-03-08 | Disposition: A | Discharge status: Home / Self Care | Payer: Medicare Other | Source: Ambulatory Visit | Attending: Nurse Practitioner | Admitting: Nurse Practitioner

## 2018-03-08 ENCOUNTER — Encounter: Payer: Self-pay | Admitting: Nurse Practitioner

## 2018-03-08 ENCOUNTER — Ambulatory Visit (INDEPENDENT_AMBULATORY_CARE_PROVIDER_SITE_OTHER): Payer: Medicare Other | Admitting: Nurse Practitioner

## 2018-03-08 ENCOUNTER — Ambulatory Visit
Admission: RE | Admit: 2018-03-08 | Discharge: 2018-03-08 | Disposition: A | Discharge status: Home / Self Care | Payer: Medicare Other | Attending: Nurse Practitioner | Admitting: Nurse Practitioner

## 2018-03-08 VITALS — BP 146/96 | HR 61 | Resp 16 | Ht 76.0 in | Wt 169.2 lb

## 2018-03-08 DIAGNOSIS — S62514B Nondisplaced fracture of proximal phalanx of right thumb, initial encounter for open fracture: Secondary | ICD-10-CM | POA: Insufficient documentation

## 2018-03-08 DIAGNOSIS — I10 Essential (primary) hypertension: Secondary | ICD-10-CM | POA: Diagnosis not present

## 2018-03-08 DIAGNOSIS — M545 Low back pain, unspecified: Secondary | ICD-10-CM | POA: Insufficient documentation

## 2018-03-08 DIAGNOSIS — S62514S Nondisplaced fracture of proximal phalanx of right thumb, sequela: Secondary | ICD-10-CM

## 2018-03-08 DIAGNOSIS — F332 Major depressive disorder, recurrent severe without psychotic features: Secondary | ICD-10-CM | POA: Diagnosis not present

## 2018-03-08 NOTE — Progress Notes (Signed)
Pt blood pressure is elevated, checked twice

## 2018-03-08 NOTE — Progress Notes (Signed)
[facility] [address] [place], [place] [address]  Internal MEDICINE  Office Visit Note  Patient Name: Gerald Martin  [id number]  [id number]  Date of Service: 03/08/2018  Chief Complaint  Patient presents with  . Medical Management of Chronic Issues    3 month follow up, pt needs a back brace and requiring about a podiatrist pt states he has been on track with medication, medication refills,   . Pain    left thumb pained, dislocated, cant bend it, issue started back 08/2017    The patient is here for routine follow up visit. Today, he is complaining of chronic back pain. States that his back is really messed up. Would like to have prescription for pain medication, however, knows he will probably need to see pain management in order to take care of pain needs. He did have MRI on lumbar spine in 2014 which did show lumbar spondylosis at multiple levels. He has not had further imaging or follow up regarding his back pain since then. He is currently not taking anything for his pain except for over the counter medication such as ibuprofen or aleve.  He also had open fracture of right thumb. This was in 08/2017. This was repaired in ER. He continues to have reduced ROM and strength in the right thumb. Makes writing and using his dominant hand difficult. He is interested in physical therapy to help build strength in his hand as well as to help his lower back pain.        Current Medication: Outpatient Encounter Medications as of 03/08/2018  Medication Sig  . amLODipine (NORVASC) 10 MG tablet Take 1 tablet (10 mg total) by mouth daily.  . carvedilol (COREG) 12.5 MG tablet Take 1 tablet (12.5 mg total) by mouth 2 (two) times daily with a meal.  . FLUoxetine (PROZAC) 40 MG capsule Take one tab po qd  . ibuprofen (ADVIL,MOTRIN) 600 MG tablet Take 1 tablet (600 mg total) by mouth every 8 (eight) hours as needed.  . Ipratropium-Albuterol (COMBIVENT RESPIMAT) 20-100 MCG/ACT AERS  respimat Inhale 1 puff into the lungs 4 (four) times daily.  Marland Kitchen lisinopril (PRINIVIL,ZESTRIL) 40 MG tablet Take 1 tablet (40 mg total) by mouth daily.  . QUEtiapine (SEROQUEL) 100 MG tablet Take 1 tablet (100 mg total) by mouth at bedtime.  . ranitidine (ZANTAC) 150 MG tablet Take 1 tablet (150 mg total) by mouth 2 (two) times daily.  Marland Kitchen tiotropium (SPIRIVA) 18 MCG inhalation capsule Place 1 capsule (18 mcg total) into inhaler and inhale daily.  . traMADol (ULTRAM) 50 MG tablet Take 1 tablet (50 mg total) by mouth 3 (three) times daily as needed. for pain  . traZODone (DESYREL) 100 MG tablet Take one tab po qhs for sleep  . HYDROcodone-acetaminophen (NORCO) 5-325 MG tablet Take 1 tablet by mouth every 6 (six) hours as needed for up to 15 doses for severe pain.   No facility-administered encounter medications on file as of 03/08/2018.     Surgical History: Past Surgical History:  Procedure Laterality Date  . arm surgery[Other] Left    chain saw injury  . chronic pain    . LEG SURGERY Right     Medical History: Past Medical History:  Diagnosis Date  . Anxiety   . Chronic pain    back  . COPD (chronic obstructive pulmonary disease) (HCC)   . Depression   . GERD (gastroesophageal reflux disease)   . Hyperlipidemia   . Hypertension  Family History: Family History  Problem Relation Age of Onset  . Stroke Mother   . Diabetes Father     Social History   Socioeconomic History  . Marital status: Single    Spouse name: Not on file  . Number of children: Not on file  . Years of education: Not on file  . Highest education level: Not on file  Occupational History  . Not on file  Social Needs  . Financial resource strain: Not on file  . Food insecurity:    Worry: Not on file    Inability: Not on file  . Transportation needs:    Medical: Not on file    Non-medical: Not on file  Tobacco Use  . Smoking status: Current Every Day Smoker    Packs/day: 2.00    Years: 30.00     Pack years: 60.00  . Smokeless tobacco: Never Used  Substance and Sexual Activity  . Alcohol use: Yes  . Drug use: No  . Sexual activity: Not on file  Lifestyle  . Physical activity:    Days per week: Not on file    Minutes per session: Not on file  . Stress: Not on file  Relationships  . Social connections:    Talks on phone: Not on file    Gets together: Not on file    Attends religious service: Not on file    Active member of club or organization: Not on file    Attends meetings of clubs or organizations: Not on file    Relationship status: Not on file  . Intimate partner violence:    Fear of current or ex partner: Not on file    Emotionally abused: Not on file    Physically abused: Not on file    Forced sexual activity: Not on file  Other Topics Concern  . Not on file  Social History Narrative  . Not on file      Review of Systems  Constitutional: Negative for activity change, chills, fatigue and unexpected weight change.  HENT: Negative for congestion, rhinorrhea, sneezing and sore throat.   Respiratory: Negative for cough, chest tightness, shortness of breath and wheezing.   Cardiovascular: Negative for chest pain and palpitations.  Gastrointestinal: Negative for abdominal pain, constipation, diarrhea, nausea and vomiting.  Endocrine: Negative for cold intolerance, heat intolerance, polydipsia and polyuria.  Musculoskeletal: Positive for arthralgias and back pain. Negative for joint swelling and neck pain.  Skin: Negative for rash.  Allergic/Immunologic: Negative.   Neurological: Negative for tremors, numbness and headaches.  Hematological: Negative for adenopathy. Does not bruise/bleed easily.  Psychiatric/Behavioral: Positive for dysphoric mood. Negative for behavioral problems, sleep disturbance and suicidal ideas. The patient is not nervous/anxious.        Patient states he is no longer drinking or doing any illegal drugs. Mental health stable .   Today's  Vitals   03/08/18 1406  BP: (!) 146/96  Pulse: 61  Resp: 16  SpO2: 95%  Weight: 169 lb 3.2 oz (76.7 kg)  Height: 6\' 4"  (1.93 m)   Body mass index is 20.6 kg/m.  Physical Exam Vitals signs and nursing note reviewed.  Constitutional:      General: He is not in acute distress.    Appearance: Normal appearance. He is well-developed. He is not diaphoretic.  HENT:     Head: Normocephalic and atraumatic.     Mouth/Throat:     Pharynx: No oropharyngeal exudate.  Eyes:     Conjunctiva/sclera: Conjunctivae  normal.     Pupils: Pupils are equal, round, and reactive to light.  Neck:     Musculoskeletal: Normal range of motion and neck supple.     Thyroid: No thyromegaly.     Vascular: No carotid bruit or JVD.     Trachea: No tracheal deviation.  Cardiovascular:     Rate and Rhythm: Normal rate and regular rhythm.     Heart sounds: Normal heart sounds. No murmur. No friction rub. No gallop.   Pulmonary:     Effort: Pulmonary effort is normal. No respiratory distress.     Breath sounds: Normal breath sounds. No wheezing or rales.  Chest:     Chest wall: No tenderness.  Abdominal:     General: Bowel sounds are normal.     Palpations: Abdomen is soft.     Tenderness: There is no abdominal tenderness. There is no guarding.  Musculoskeletal: Normal range of motion.     Comments: There is swelling of right thumb with reduced ROM and strength.  Moderate lower back pain, making it difficult for him to bend and twist at the waist. No visible abnormalities present at this time.   Lymphadenopathy:     Cervical: No cervical adenopathy.  Skin:    General: Skin is warm and dry.  Neurological:     Mental Status: He is alert and oriented to person, place, and time. Mental status is at baseline.     Cranial Nerves: No cranial nerve deficit.  Psychiatric:        Behavior: Behavior normal.        Thought Content: Thought content normal.        Judgment: Judgment normal.     Assessment/Plan:  1. Low back pain, unspecified back pain laterality, unspecified chronicity, unspecified whether sciatica present Patient given written prescription for back brace. May take to DME provider and wear as needed to reduce pain. Will get new x-ray of lumbar spine for further evaluation. May need to get MRI. Consider referral to pain management after imaging results available.  - DG Lumbar Spine Complete; Future  2. Essential hypertension Stable. Continue bp medication as prescribed   3. Severe recurrent major depression without psychotic features (HCC) Stable. No new medications or medications advised. Continue as before.   4. Open nondisplaced fracture of proximal phalanx of right thumb, sequela Written prescription for physical therapy given to improve strength and ROM of the right thumb.    General Counseling: Lenora Boys understanding of the findings of todays visit and agrees with plan of treatment. I have discussed any further diagnostic evaluation that may be needed or ordered today. We also reviewed his medications today. he has been encouraged to call the office with any questions or concerns that should arise related to todays visit.    Orders Placed This Encounter  Procedures  . DG Lumbar Spine Complete   Hypertension Counseling:   The following hypertensive lifestyle modification were recommended and discussed:  1. Limiting alcohol intake to less than 1 oz/day of ethanol:(24 oz of beer or 8 oz of wine or 2 oz of 100-proof whiskey). 2. Take baby ASA 81 mg daily. 3. Importance of regular aerobic exercise and losing weight. 4. Reduce dietary saturated fat and cholesterol intake for overall cardiovascular health. 5. Maintaining adequate dietary potassium, calcium, and magnesium intake. 6. Regular monitoring of the blood pressure. 7. Reduce sodium intake to less than 100 mmol/day (less than 2.3 gm of sodium or less than 6 gm of sodium  choride)   This  patient was seen by Vincent GrosHeather Mallika Sanmiguel FNP Collaboration with Dr Lyndon CodeFozia M Khan as a part of collaborative care agreement  Time spent: 25 Minutes      Dr Lyndon CodeFozia M Khan Internal medicine

## 2018-03-13 ENCOUNTER — Other Ambulatory Visit: Payer: Self-pay | Admitting: Internal Medicine

## 2018-03-13 DIAGNOSIS — F332 Major depressive disorder, recurrent severe without psychotic features: Secondary | ICD-10-CM

## 2018-04-19 ENCOUNTER — Other Ambulatory Visit: Payer: Self-pay

## 2018-04-19 ENCOUNTER — Encounter: Payer: Self-pay | Admitting: Nurse Practitioner

## 2018-04-19 ENCOUNTER — Ambulatory Visit (INDEPENDENT_AMBULATORY_CARE_PROVIDER_SITE_OTHER): Payer: Medicare Other | Admitting: Nurse Practitioner

## 2018-04-19 ENCOUNTER — Telehealth: Payer: Self-pay | Admitting: Nurse Practitioner

## 2018-04-19 VITALS — BP 123/84 | HR 54 | Resp 16 | Ht 76.0 in | Wt 166.6 lb

## 2018-04-19 DIAGNOSIS — I10 Essential (primary) hypertension: Secondary | ICD-10-CM | POA: Diagnosis not present

## 2018-04-19 DIAGNOSIS — M545 Low back pain, unspecified: Secondary | ICD-10-CM

## 2018-04-19 MED ORDER — IBUPROFEN 600 MG PO TABS: 600.0000 mg | ORAL_TABLET | Freq: Three times a day (TID) | ORAL | 0 refills | Status: DC | PRN

## 2018-04-19 NOTE — Telephone Encounter (Signed)
Patient was referred to orthopedic , but patient is needing a referral to one here in [place] not [place]

## 2018-04-19 NOTE — Telephone Encounter (Signed)
That's fine. I thought that is what I did, but the abbrevieations are so small I can't tell. Just do referral for ortho in [place]. Thanks.

## 2018-04-19 NOTE — Telephone Encounter (Signed)
Beth can you please do this

## 2018-04-19 NOTE — Progress Notes (Signed)
[facility] [address] [place], [place] [address]  Internal MEDICINE  Office Visit Note  Patient Name: Gerald Martin  [id number]  [id number]  Date of Service: 04/19/2018  Chief Complaint  Patient presents with  . Medical Management of Chronic Issues    6wk follow up, medication refills  . Labs Only    xray results  . Pain    pain in feet and back still havent changed    The patient is here for routine follow up visit. Today, he is complaining of chronic back pain. States that his back is really messed up. Had x-ray of his lumbar spine. This did show moderate spondylosis of lumbar spine and multi level disc disease. Has been doing exercise at home, to help with low back pain. Is not really helping. Used to see pain management at one point in time, but not in long time. Does not see orthopedics at this time.       Current Medication: Outpatient Encounter Medications as of 04/19/2018  Medication Sig  . amLODipine (NORVASC) 10 MG tablet Take 1 tablet (10 mg total) by mouth daily.  . carvedilol (COREG) 12.5 MG tablet Take 1 tablet (12.5 mg total) by mouth 2 (two) times daily with a meal.  . FLUoxetine (PROZAC) 40 MG capsule TAKE 1 CAPSULE BY MOUTH DAILY  . HYDROcodone-acetaminophen (NORCO) 5-325 MG tablet Take 1 tablet by mouth every 6 (six) hours as needed for up to 15 doses for severe pain.  Marland Kitchen ibuprofen (ADVIL,MOTRIN) 600 MG tablet Take 1 tablet (600 mg total) by mouth every 8 (eight) hours as needed.  . Ipratropium-Albuterol (COMBIVENT RESPIMAT) 20-100 MCG/ACT AERS respimat Inhale 1 puff into the lungs 4 (four) times daily.  Marland Kitchen lisinopril (PRINIVIL,ZESTRIL) 40 MG tablet Take 1 tablet (40 mg total) by mouth daily.  . QUEtiapine (SEROQUEL) 100 MG tablet Take 1 tablet (100 mg total) by mouth at bedtime.  . ranitidine (ZANTAC) 150 MG tablet Take 1 tablet (150 mg total) by mouth 2 (two) times daily.  Marland Kitchen tiotropium (SPIRIVA) 18 MCG inhalation capsule Place 1 capsule (18 mcg  total) into inhaler and inhale daily.  . traMADol (ULTRAM) 50 MG tablet Take 1 tablet (50 mg total) by mouth 3 (three) times daily as needed. for pain  . traZODone (DESYREL) 100 MG tablet Take one tab po qhs for sleep  . [DISCONTINUED] ibuprofen (ADVIL,MOTRIN) 600 MG tablet Take 1 tablet (600 mg total) by mouth every 8 (eight) hours as needed.  . [DISCONTINUED] ibuprofen (ADVIL,MOTRIN) 600 MG tablet Take 1 tablet (600 mg total) by mouth every 8 (eight) hours as needed.   No facility-administered encounter medications on file as of 04/19/2018.     Surgical History: Past Surgical History:  Procedure Laterality Date  . arm surgery[Other] Left    chain saw injury  . chronic pain    . LEG SURGERY Right     Medical History: Past Medical History:  Diagnosis Date  . Anxiety   . Chronic pain    back  . COPD (chronic obstructive pulmonary disease) (HCC)   . Depression   . GERD (gastroesophageal reflux disease)   . Hyperlipidemia   . Hypertension     Family History: Family History  Problem Relation Age of Onset  . Stroke Mother   . Diabetes Father     Social History   Socioeconomic History  . Marital status: Single    Spouse name: Not on file  . Number of children: Not on file  .  Years of education: Not on file  . Highest education level: Not on file  Occupational History  . Not on file  Social Needs  . Financial resource strain: Not on file  . Food insecurity:    Worry: Not on file    Inability: Not on file  . Transportation needs:    Medical: Not on file    Non-medical: Not on file  Tobacco Use  . Smoking status: Current Every Day Smoker    Packs/day: 2.00    Years: 30.00    Pack years: 60.00  . Smokeless tobacco: Never Used  Substance and Sexual Activity  . Alcohol use: Not Currently  . Drug use: No  . Sexual activity: Not on file  Lifestyle  . Physical activity:    Days per week: Not on file    Minutes per session: Not on file  . Stress: Not on file   Relationships  . Social connections:    Talks on phone: Not on file    Gets together: Not on file    Attends religious service: Not on file    Active member of club or organization: Not on file    Attends meetings of clubs or organizations: Not on file    Relationship status: Not on file  . Intimate partner violence:    Fear of current or ex partner: Not on file    Emotionally abused: Not on file    Physically abused: Not on file    Forced sexual activity: Not on file  Other Topics Concern  . Not on file  Social History Narrative  . Not on file      Review of Systems  Constitutional: Positive for activity change. Negative for chills, fatigue and unexpected weight change.  HENT: Negative for congestion, rhinorrhea, sneezing and sore throat.   Respiratory: Negative for cough, chest tightness, shortness of breath and wheezing.   Cardiovascular: Negative for chest pain and palpitations.  Gastrointestinal: Negative for abdominal pain, constipation, diarrhea, nausea and vomiting.  Endocrine: Negative for cold intolerance, heat intolerance, polydipsia and polyuria.  Musculoskeletal: Positive for arthralgias and back pain. Negative for joint swelling and neck pain.  Skin: Negative for rash.  Neurological: Negative for tremors, numbness and headaches.  Hematological: Negative for adenopathy. Does not bruise/bleed easily.  Psychiatric/Behavioral: Positive for dysphoric mood and sleep disturbance. Negative for behavioral problems and suicidal ideas. The patient is not nervous/anxious.        Patient states he is no longer drinking or doing any illegal drugs. Mental health stable .   Today's Vitals   04/19/18 0848  BP: 123/84  Pulse: (!) 54  Resp: 16  SpO2: 98%  Weight: 166 lb 9.6 oz (75.6 kg)  Height: 6\' 4"  (1.93 m)   Body mass index is 20.28 kg/m.  Physical Exam Vitals signs and nursing note reviewed.  Constitutional:      General: He is not in acute distress.     Appearance: Normal appearance. He is well-developed. He is not diaphoretic.  HENT:     Head: Normocephalic and atraumatic.     Mouth/Throat:     Pharynx: No oropharyngeal exudate.  Eyes:     Conjunctiva/sclera: Conjunctivae normal.     Pupils: Pupils are equal, round, and reactive to light.  Neck:     Musculoskeletal: Normal range of motion and neck supple.     Thyroid: No thyromegaly.     Vascular: No carotid bruit or JVD.     Trachea: No tracheal  deviation.  Cardiovascular:     Rate and Rhythm: Normal rate and regular rhythm.     Heart sounds: Normal heart sounds. No murmur. No friction rub. No gallop.   Pulmonary:     Effort: Pulmonary effort is normal. No respiratory distress.     Breath sounds: Normal breath sounds. No wheezing or rales.  Chest:     Chest wall: No tenderness.  Abdominal:     General: Bowel sounds are normal.     Palpations: Abdomen is soft.     Tenderness: There is no abdominal tenderness. There is no guarding.  Musculoskeletal: Normal range of motion.     Comments: Moderate lower back pain, making it difficult for him to bend and twist at the waist. No visible abnormalities present at this time.   Lymphadenopathy:     Cervical: No cervical adenopathy.  Skin:    General: Skin is warm and dry.  Neurological:     Mental Status: He is alert and oriented to person, place, and time. Mental status is at baseline.     Cranial Nerves: No cranial nerve deficit.  Psychiatric:        Behavior: Behavior normal.        Thought Content: Thought content normal.        Judgment: Judgment normal.    Assessment/Plan: 1. Low back pain, unspecified back pain laterality, unspecified chronicity, unspecified whether sciatica present X-ray of lumbar spine showing moderate spondylosis of lumbar spine with multi level degenerative disc disease. Add ibuprofen  which may be taken up to three times daily if needed for pain/inflammation. Refer to orthopedics for further  evaluation and treatment.  - Ambulatory referral to Orthopedic Surgery - ibuprofen (ADVIL,MOTRIN) 600 MG tablet; Take 1 tablet (600 mg total) by mouth every 8 (eight) hours as needed.  Dispense: 30 tablet; Refill: 0  2. Essential hypertension Stable. Continue bp medication as prescribed   General Counseling: Lenora Boys understanding of the findings of todays visit and agrees with plan of treatment. I have discussed any further diagnostic evaluation that may be needed or ordered today. We also reviewed his medications today. he has been encouraged to call the office with any questions or concerns that should arise related to todays visit.  This patient was seen by Vincent Gros FNP Collaboration with Dr Lyndon Code as a part of collaborative care agreement  Orders Placed This Encounter  Procedures  . Ambulatory referral to Orthopedic Surgery    Meds ordered this encounter  Medications  . DISCONTD: ibuprofen (ADVIL,MOTRIN) 600 MG tablet    Sig: Take 1 tablet (600 mg total) by mouth every 8 (eight) hours as needed.    Dispense:  30 tablet    Refill:  0    Order Specific Question:   Supervising Provider    Answer:   Lyndon Code [[id number]]  . ibuprofen (ADVIL,MOTRIN) 600 MG tablet    Sig: Take 1 tablet (600 mg total) by mouth every 8 (eight) hours as needed.    Dispense:  30 tablet    Refill:  0    Order Specific Question:   Supervising Provider    Answer:   Lyndon Code [[id number]]    Time spent: 42 Minutes      Dr Lyndon Code Internal medicine

## 2018-04-25 ENCOUNTER — Ambulatory Visit (INDEPENDENT_AMBULATORY_CARE_PROVIDER_SITE_OTHER): Payer: Medicare Other | Admitting: Orthopaedic Surgery

## 2018-05-07 ENCOUNTER — Other Ambulatory Visit: Payer: Self-pay | Admitting: Internal Medicine

## 2018-05-07 DIAGNOSIS — I161 Hypertensive emergency: Secondary | ICD-10-CM

## 2018-05-15 ENCOUNTER — Other Ambulatory Visit: Payer: Self-pay | Admitting: Nurse Practitioner

## 2018-05-15 DIAGNOSIS — M545 Low back pain, unspecified: Secondary | ICD-10-CM

## 2018-05-15 MED ORDER — IBUPROFEN 600 MG PO TABS: 600.0000 mg | ORAL_TABLET | Freq: Three times a day (TID) | ORAL | 0 refills | Status: DC | PRN

## 2018-05-17 ENCOUNTER — Telehealth: Payer: Self-pay | Admitting: Nurse Practitioner

## 2018-05-17 NOTE — Telephone Encounter (Signed)
When I saw him end of March, I gave prescription for ibuprofen and gave referral to orthopedics. He wuold really ned to see them to get stronger prescription.

## 2018-05-31 ENCOUNTER — Other Ambulatory Visit: Payer: Self-pay

## 2018-05-31 ENCOUNTER — Inpatient Hospital Stay
Admission: EM | Admit: 2018-05-31 | Discharge: 2018-06-03 | DRG: 066 | Disposition: A | Discharge status: Home / Self Care | Payer: Medicare Other | Attending: Internal Medicine | Admitting: Internal Medicine

## 2018-05-31 ENCOUNTER — Inpatient Hospital Stay: Payer: Medicare Other

## 2018-05-31 ENCOUNTER — Emergency Department: Payer: Medicare Other

## 2018-05-31 ENCOUNTER — Ambulatory Visit: Payer: Medicare Other | Admitting: Nurse Practitioner

## 2018-05-31 DIAGNOSIS — Z20828 Contact with and (suspected) exposure to other viral communicable diseases: Secondary | ICD-10-CM | POA: Diagnosis present

## 2018-05-31 DIAGNOSIS — R05 Cough: Secondary | ICD-10-CM | POA: Diagnosis not present

## 2018-05-31 DIAGNOSIS — R29700 NIHSS score 0: Secondary | ICD-10-CM | POA: Diagnosis present

## 2018-05-31 DIAGNOSIS — H5509 Other forms of nystagmus: Secondary | ICD-10-CM | POA: Diagnosis present

## 2018-05-31 DIAGNOSIS — R509 Fever, unspecified: Secondary | ICD-10-CM | POA: Diagnosis not present

## 2018-05-31 DIAGNOSIS — I371 Nonrheumatic pulmonary valve insufficiency: Secondary | ICD-10-CM | POA: Diagnosis not present

## 2018-05-31 DIAGNOSIS — F17210 Nicotine dependence, cigarettes, uncomplicated: Secondary | ICD-10-CM | POA: Diagnosis present

## 2018-05-31 DIAGNOSIS — E0590 Thyrotoxicosis, unspecified without thyrotoxic crisis or storm: Secondary | ICD-10-CM | POA: Diagnosis present

## 2018-05-31 DIAGNOSIS — R42 Dizziness and giddiness: Secondary | ICD-10-CM

## 2018-05-31 DIAGNOSIS — I639 Cerebral infarction, unspecified: Secondary | ICD-10-CM

## 2018-05-31 DIAGNOSIS — Z03818 Encounter for observation for suspected exposure to other biological agents ruled out: Secondary | ICD-10-CM | POA: Diagnosis not present

## 2018-05-31 DIAGNOSIS — R680 Hypothermia, not associated with low environmental temperature: Secondary | ICD-10-CM | POA: Diagnosis present

## 2018-05-31 DIAGNOSIS — R001 Bradycardia, unspecified: Secondary | ICD-10-CM | POA: Diagnosis present

## 2018-05-31 DIAGNOSIS — I10 Essential (primary) hypertension: Secondary | ICD-10-CM | POA: Diagnosis present

## 2018-05-31 DIAGNOSIS — E041 Nontoxic single thyroid nodule: Secondary | ICD-10-CM | POA: Diagnosis present

## 2018-05-31 DIAGNOSIS — Z7951 Long term (current) use of inhaled steroids: Secondary | ICD-10-CM

## 2018-05-31 DIAGNOSIS — Z79899 Other long term (current) drug therapy: Secondary | ICD-10-CM

## 2018-05-31 DIAGNOSIS — I340 Nonrheumatic mitral (valve) insufficiency: Secondary | ICD-10-CM | POA: Diagnosis not present

## 2018-05-31 DIAGNOSIS — Z9114 Patient's other noncompliance with medication regimen: Secondary | ICD-10-CM | POA: Diagnosis not present

## 2018-05-31 DIAGNOSIS — J449 Chronic obstructive pulmonary disease, unspecified: Secondary | ICD-10-CM | POA: Diagnosis present

## 2018-05-31 DIAGNOSIS — F329 Major depressive disorder, single episode, unspecified: Secondary | ICD-10-CM | POA: Diagnosis present

## 2018-05-31 DIAGNOSIS — A419 Sepsis, unspecified organism: Secondary | ICD-10-CM | POA: Diagnosis not present

## 2018-05-31 DIAGNOSIS — T68XXXA Hypothermia, initial encounter: Secondary | ICD-10-CM

## 2018-05-31 DIAGNOSIS — E785 Hyperlipidemia, unspecified: Secondary | ICD-10-CM | POA: Diagnosis not present

## 2018-05-31 DIAGNOSIS — Z209 Contact with and (suspected) exposure to unspecified communicable disease: Secondary | ICD-10-CM | POA: Diagnosis not present

## 2018-05-31 DIAGNOSIS — R0902 Hypoxemia: Secondary | ICD-10-CM | POA: Diagnosis not present

## 2018-05-31 HISTORY — DX: Essential (primary) hypertension: I10

## 2018-05-31 LAB — URINE DRUG SCREEN, QUALITATIVE (ARMC ONLY)
Amphetamines, Ur Screen: NOT DETECTED
Barbiturates, Ur Screen: NOT DETECTED
Benzodiazepine, Ur Scrn: NOT DETECTED
Cannabinoid 50 Ng, Ur ~~LOC~~: NOT DETECTED
Cocaine Metabolite,Ur ~~LOC~~: NOT DETECTED
MDMA (Ecstasy)Ur Screen: NOT DETECTED
Methadone Scn, Ur: NOT DETECTED
Opiate, Ur Screen: NOT DETECTED
Phencyclidine (PCP) Ur S: NOT DETECTED
Tricyclic, Ur Screen: NOT DETECTED

## 2018-05-31 LAB — CBC
HCT: 45.9 % (ref 39.0–52.0)
Hemoglobin: 15.5 g/dL (ref 13.0–17.0)
MCH: 31.3 pg (ref 26.0–34.0)
MCHC: 33.8 g/dL (ref 30.0–36.0)
MCV: 92.7 fL (ref 80.0–100.0)
Platelets: 261 10*3/uL (ref 150–400)
RBC: 4.95 MIL/uL (ref 4.22–5.81)
RDW: 12.9 % (ref 11.5–15.5)
WBC: 10.9 10*3/uL — ABNORMAL HIGH (ref 4.0–10.5)
nRBC: 0 % (ref 0.0–0.2)

## 2018-05-31 LAB — TROPONIN I
Troponin I: <0.03 ng/mL (ref <0.03)
Troponin I: <0.03 ng/mL (ref <0.03)

## 2018-05-31 LAB — HEPATIC FUNCTION PANEL
ALT: 35 U/L (ref 0–44)
AST: 23 U/L (ref 15–41)
Albumin: 3.6 g/dL (ref 3.5–5.0)
Alkaline Phosphatase: 64 U/L (ref 38–126)
Bilirubin, Direct: 0.2 mg/dL (ref 0.0–0.2)
Indirect Bilirubin: 0.6 mg/dL (ref 0.3–0.9)
Total Bilirubin: 0.8 mg/dL (ref 0.3–1.2)
Total Protein: 7 g/dL (ref 6.5–8.1)

## 2018-05-31 LAB — BASIC METABOLIC PANEL
Anion gap: 9 (ref 5–15)
BUN: 12 mg/dL (ref 6–20)
CO2: 25 mmol/L (ref 22–32)
Calcium: 9.1 mg/dL (ref 8.9–10.3)
Chloride: 106 mmol/L (ref 98–111)
Creatinine, Ser: 0.71 mg/dL (ref 0.61–1.24)
GFR calc Af Amer: >60 mL/min (ref >60)
GFR calc non Af Amer: >60 mL/min (ref >60)
Glucose, Bld: 116 mg/dL — ABNORMAL HIGH (ref 70–99)
Potassium: 3.9 mmol/L (ref 3.5–5.1)
Sodium: 140 mmol/L (ref 135–145)

## 2018-05-31 LAB — URINALYSIS, COMPLETE (UACMP) WITH MICROSCOPIC
Bacteria, UA: NONE SEEN
Bilirubin Urine: NEGATIVE
Glucose, UA: NEGATIVE mg/dL
Hgb urine dipstick: NEGATIVE
Ketones, ur: 5 mg/dL — AB
Leukocytes,Ua: NEGATIVE
Nitrite: NEGATIVE
Protein, ur: NEGATIVE mg/dL
Specific Gravity, Urine: 1.015 (ref 1.005–1.030)
Squamous Epithelial / HPF: NONE SEEN (ref 0–5)
pH: 6 (ref 5.0–8.0)

## 2018-05-31 LAB — CBC WITH DIFFERENTIAL/PLATELET
Abs Immature Granulocytes: 0.03 10*3/uL (ref 0.00–0.07)
Basophils Absolute: 0 10*3/uL (ref 0.0–0.1)
Basophils Relative: 0 %
Eosinophils Absolute: 0.1 10*3/uL (ref 0.0–0.5)
Eosinophils Relative: 1 %
HCT: 46.1 % (ref 39.0–52.0)
Hemoglobin: 15.6 g/dL (ref 13.0–17.0)
Immature Granulocytes: 0 %
Lymphocytes Relative: 7 %
Lymphs Abs: 0.8 10*3/uL (ref 0.7–4.0)
MCH: 31.5 pg (ref 26.0–34.0)
MCHC: 33.8 g/dL (ref 30.0–36.0)
MCV: 93.1 fL (ref 80.0–100.0)
Monocytes Absolute: 0.4 10*3/uL (ref 0.1–1.0)
Monocytes Relative: 4 %
Neutro Abs: 9.3 10*3/uL — ABNORMAL HIGH (ref 1.7–7.7)
Neutrophils Relative %: 88 %
Platelets: 272 10*3/uL (ref 150–400)
RBC: 4.95 MIL/uL (ref 4.22–5.81)
RDW: 13 % (ref 11.5–15.5)
WBC: 10.6 10*3/uL — ABNORMAL HIGH (ref 4.0–10.5)
nRBC: 0 % (ref 0.0–0.2)

## 2018-05-31 LAB — INFLUENZA PANEL BY PCR (TYPE A & B)
Influenza A By PCR: NEGATIVE
Influenza B By PCR: NEGATIVE

## 2018-05-31 LAB — SARS CORONAVIRUS 2 BY RT PCR (HOSPITAL ORDER, PERFORMED IN ~~LOC~~ HOSPITAL LAB): SARS Coronavirus 2: NEGATIVE

## 2018-05-31 LAB — PROCALCITONIN: Procalcitonin: <0.1 ng/mL

## 2018-05-31 LAB — LACTIC ACID, PLASMA: Lactic Acid, Venous: 1.1 mmol/L (ref 0.5–1.9)

## 2018-05-31 LAB — ETHANOL: Alcohol, Ethyl (B): <10 mg/dL (ref <10)

## 2018-05-31 MED ORDER — POLYETHYLENE GLYCOL 3350 17 G PO PACK: 17.0000 g | PACK | Freq: Every day | ORAL | Status: DC | PRN

## 2018-05-31 MED ORDER — SODIUM CHLORIDE 0.9 % IV BOLUS (SEPSIS)
1000.0000 mL | Freq: Once | INTRAVENOUS | Status: AC
  Administered 2018-05-31: 13:00:00 1000 mL via INTRAVENOUS

## 2018-05-31 MED ORDER — PREDNISONE 50 MG PO TABS
60.0000 mg | ORAL_TABLET | Freq: Every day | ORAL | Status: DC
  Administered 2018-05-31: 60 mg via ORAL
  Filled 2018-05-31: qty 1

## 2018-05-31 MED ORDER — ONDANSETRON HCL 4 MG/2ML IJ SOLN
4.0000 mg | Freq: Once | INTRAMUSCULAR | Status: AC
  Administered 2018-05-31: 14:00:00 4 mg via INTRAVENOUS

## 2018-05-31 MED ORDER — ONDANSETRON HCL 4 MG/2ML IJ SOLN
INTRAMUSCULAR | Status: AC
  Administered 2018-05-31: 14:00:00 4 mg via INTRAVENOUS
  Filled 2018-05-31: qty 2

## 2018-05-31 MED ORDER — ONDANSETRON HCL 4 MG PO TABS: 4.0000 mg | ORAL_TABLET | Freq: Four times a day (QID) | ORAL | Status: DC | PRN

## 2018-05-31 MED ORDER — AMLODIPINE BESYLATE 10 MG PO TABS
10.0000 mg | ORAL_TABLET | Freq: Every day | ORAL | Status: DC
  Administered 2018-05-31 – 2018-06-03 (×4): 10 mg via ORAL
  Filled 2018-05-31 (×4): qty 1

## 2018-05-31 MED ORDER — MECLIZINE HCL 25 MG PO TABS
25.0000 mg | ORAL_TABLET | Freq: Three times a day (TID) | ORAL | Status: DC
  Administered 2018-05-31 (×2): 25 mg via ORAL
  Filled 2018-05-31 (×3): qty 1

## 2018-05-31 MED ORDER — SODIUM CHLORIDE 0.9 % IV SOLN
500.0000 mg | INTRAVENOUS | Status: DC
  Administered 2018-05-31: 500 mg via INTRAVENOUS
  Filled 2018-05-31 (×2): qty 500

## 2018-05-31 MED ORDER — ACETAMINOPHEN 325 MG PO TABS
650.0000 mg | ORAL_TABLET | Freq: Four times a day (QID) | ORAL | Status: DC | PRN
  Administered 2018-06-03: 650 mg via ORAL
  Filled 2018-05-31: qty 2

## 2018-05-31 MED ORDER — TIOTROPIUM BROMIDE MONOHYDRATE 18 MCG IN CAPS
18.0000 ug | ORAL_CAPSULE | Freq: Every day | RESPIRATORY_TRACT | Status: DC
  Administered 2018-06-01 – 2018-06-03 (×3): 18 ug via RESPIRATORY_TRACT
  Filled 2018-05-31: qty 5

## 2018-05-31 MED ORDER — SODIUM CHLORIDE 0.9 % IV SOLN
2.0000 g | INTRAVENOUS | Status: DC
  Administered 2018-05-31: 13:00:00 2 g via INTRAVENOUS
  Filled 2018-05-31 (×2): qty 20

## 2018-05-31 MED ORDER — LISINOPRIL 20 MG PO TABS
40.0000 mg | ORAL_TABLET | Freq: Every day | ORAL | Status: DC
  Administered 2018-05-31 – 2018-06-03 (×4): 40 mg via ORAL
  Filled 2018-05-31 (×4): qty 2

## 2018-05-31 MED ORDER — ONDANSETRON HCL 4 MG/2ML IJ SOLN: 4.0000 mg | Freq: Four times a day (QID) | INTRAMUSCULAR | Status: DC | PRN

## 2018-05-31 MED ORDER — HYDRALAZINE HCL 20 MG/ML IJ SOLN: 5.0000 mg | INTRAMUSCULAR | Status: DC | PRN

## 2018-05-31 MED ORDER — ACETAMINOPHEN 650 MG RE SUPP: 650.0000 mg | Freq: Four times a day (QID) | RECTAL | Status: DC | PRN

## 2018-05-31 MED ORDER — ENOXAPARIN SODIUM 40 MG/0.4ML ~~LOC~~ SOLN
40.0000 mg | SUBCUTANEOUS | Status: DC
  Administered 2018-05-31 – 2018-06-02 (×3): 40 mg via SUBCUTANEOUS
  Filled 2018-05-31 (×3): qty 0.4

## 2018-05-31 MED ORDER — CARVEDILOL 12.5 MG PO TABS: 12.5000 mg | ORAL_TABLET | Freq: Two times a day (BID) | ORAL | Status: DC

## 2018-05-31 MED ORDER — TRAZODONE HCL 100 MG PO TABS
100.0000 mg | ORAL_TABLET | Freq: Every day | ORAL | Status: DC
  Administered 2018-05-31 – 2018-06-02 (×3): 100 mg via ORAL
  Filled 2018-05-31 (×3): qty 1

## 2018-05-31 MED ORDER — FAMOTIDINE 20 MG PO TABS
20.0000 mg | ORAL_TABLET | Freq: Two times a day (BID) | ORAL | Status: DC
  Administered 2018-05-31 – 2018-06-03 (×6): 20 mg via ORAL
  Filled 2018-05-31 (×6): qty 1

## 2018-05-31 MED ORDER — IPRATROPIUM-ALBUTEROL 20-100 MCG/ACT IN AERS
1.0000 | INHALATION_SPRAY | Freq: Four times a day (QID) | RESPIRATORY_TRACT | Status: DC
  Administered 2018-05-31 – 2018-06-03 (×11): 1 via RESPIRATORY_TRACT
  Filled 2018-05-31: qty 4

## 2018-05-31 MED ORDER — FLUOXETINE HCL 20 MG PO CAPS
40.0000 mg | ORAL_CAPSULE | Freq: Every day | ORAL | Status: DC
  Administered 2018-06-01 – 2018-06-03 (×3): 40 mg via ORAL
  Filled 2018-05-31 (×4): qty 2

## 2018-05-31 MED ORDER — SODIUM CHLORIDE 0.9 % IV BOLUS (SEPSIS)
1000.0000 mL | Freq: Once | INTRAVENOUS | Status: AC
  Administered 2018-05-31: 1000 mL via INTRAVENOUS

## 2018-05-31 NOTE — ED Triage Notes (Signed)
Pt arrives to ED via EMS from home. Pt states last night he began to have a cough and felt dizzy upon standing. Cough is non productive, Pt has hx of HTN did not take meds today. Pt arrives lethargic. Denies pain.

## 2018-05-31 NOTE — ED Notes (Signed)
Attempted to call report at this time 

## 2018-05-31 NOTE — Progress Notes (Signed)
CODE SEPSIS - PHARMACY COMMUNICATION  **Broad Spectrum Antibiotics should be administered within 1 hour of Sepsis diagnosis**  Time Code Sepsis Called/Page Received: 1215  Antibiotics Ordered: Azithromycin,Ceftriaxone  Time of 1st antibiotic administration: 1303  Additional action taken by pharmacy: no action needed currently  If necessary, Name of Provider/Nurse Contacted: N/A    Bettey Costa ,PharmD Clinical Pharmacist  05/31/2018  12:53 PM

## 2018-05-31 NOTE — ED Notes (Signed)
ED TO INPATIENT HANDOFF REPORT  ED Nurse Name and Phone #: Berline Lopes [phone]  S Name/Age/Gender Gerald Martin 53 y.o. male Room/Bed: ED01A/ED01A  Code Status   Code Status: Not on file  Home/SNF/Other Home Patient oriented to: self, place, time and situation Is this baseline? Yes   Triage Complete: Triage complete  Chief Complaint Cough/Dizzy   Triage Note Pt arrives to ED via EMS from home. Pt states last night he began to have a cough and felt dizzy upon standing. Cough is non productive, Pt has hx of HTN did not take meds today. Pt arrives lethargic. Denies pain.    Allergies Not on File  Level of Care/Admitting Diagnosis ED Disposition    ED Disposition Condition Comment   Admit  Hospital Area: [facility] [[id number]]  Level of Care: Telemetry [5]  Covid Evaluation: N/A  Diagnosis: Dizziness [[id number]]  Admitting Physician: Willadean Carol DODD [[id number]]  Attending Physician: Willadean Carol DODD [[id number]]  Estimated length of stay: past midnight tomorrow  Certification:: I certify this patient will need inpatient services for at least 2 midnights  PT Class (Do Not Modify): Inpatient [101]  PT Acc Code (Do Not Modify): Private [1]       B Medical/Surgery History Past Medical History:  Diagnosis Date  . Hypertension    History reviewed. No pertinent surgical history.   A IV Location/Drains/Wounds Patient Lines/Drains/Airways Status   Active Line/Drains/Airways    Name:   Placement date:   Placement time:   Site:   Days:   Peripheral IV 05/31/18 Right Antecubital   05/31/18    1159    Antecubital   less than 1   Peripheral IV 05/31/18 Right Forearm   05/31/18    1319    Forearm   less than 1          Intake/Output Last 24 hours  Intake/Output Summary (Last 24 hours) at 05/31/2018 1451 Last data filed at 05/31/2018 1430 Gross per 24 hour  Intake 3237.21 ml  Output -  Net 3237.21 ml    Labs/Imaging Results for orders placed or performed during  the hospital encounter of 05/31/18 (from the past 48 hour(s))  Basic metabolic panel     Status: Abnormal   Collection Time: 05/31/18 11:58 AM  Result Value Ref Range   Sodium 140 135 - 145 mmol/L   Potassium 3.9 3.5 - 5.1 mmol/L   Chloride 106 98 - 111 mmol/L   CO2 25 22 - 32 mmol/L   Glucose, Bld 116 (H) 70 - 99 mg/dL   BUN 12 6 - 20 mg/dL   Creatinine, Ser 1.32 0.61 - 1.24 mg/dL   Calcium 9.1 8.9 - 44.0 mg/dL   GFR calc non Af Amer >60 >60 mL/min   GFR calc Af Amer >60 >60 mL/min   Anion gap 9 5 - 15    Comment: Performed at [facility], [address] [place], [place] [address]  CBC     Status: Abnormal   Collection Time: 05/31/18 11:58 AM  Result Value Ref Range   WBC 10.9 (H) 4.0 - 10.5 K/uL   RBC 4.95 4.22 - 5.81 MIL/uL   Hemoglobin 15.5 13.0 - 17.0 g/dL   HCT 53.6 64.4 - 03.4 %   MCV 92.7 80.0 - 100.0 fL   MCH 31.3 26.0 - 34.0 pg   MCHC 33.8 30.0 - 36.0 g/dL   RDW 74.2 59.5 - 63.8 %   Platelets 261 150 - 400 K/uL  nRBC 0.0 0.0 - 0.2 %    Comment: Performed at [facility], [address] [place], [place] [address]  Procalcitonin     Status: None   Collection Time: 05/31/18 11:58 AM  Result Value Ref Range   Procalcitonin <0.10 ng/mL    Comment:        Interpretation: PCT (Procalcitonin) <= 0.5 ng/mL: Systemic infection (sepsis) is not likely. Local bacterial infection is possible. (NOTE)       Sepsis PCT Algorithm           Lower Respiratory Tract                                      Infection PCT Algorithm    ----------------------------     ----------------------------         PCT < 0.25 ng/mL                PCT < 0.10 ng/mL         Strongly encourage             Strongly discourage   discontinuation of antibiotics    initiation of antibiotics    ----------------------------     -----------------------------       PCT 0.25 - 0.50 ng/mL            PCT 0.10 - 0.25 ng/mL               OR       >80% decrease in PCT            Discourage  initiation of                                            antibiotics      Encourage discontinuation           of antibiotics    ----------------------------     -----------------------------         PCT >= 0.50 ng/mL              PCT 0.26 - 0.50 ng/mL               AND        <80% decrease in PCT             Encourage initiation of                                             antibiotics       Encourage continuation           of antibiotics    ----------------------------     -----------------------------        PCT >= 0.50 ng/mL                  PCT > 0.50 ng/mL               AND         increase in PCT                  Strongly encourage  initiation of antibiotics    Strongly encourage escalation           of antibiotics                                     -----------------------------                                           PCT <= 0.25 ng/mL                                                 OR                                        > 80% decrease in PCT                                     Discontinue / Do not initiate                                             antibiotics Performed at [facility], [address] [place], [place] [address]   Hepatic function panel     Status: None   Collection Time: 05/31/18 11:58 AM  Result Value Ref Range   Total Protein 7.0 6.5 - 8.1 g/dL   Albumin 3.6 3.5 - 5.0 g/dL   AST 23 15 - 41 U/L   ALT 35 0 - 44 U/L   Alkaline Phosphatase 64 38 - 126 U/L   Total Bilirubin 0.8 0.3 - 1.2 mg/dL   Bilirubin, Direct 0.2 0.0 - 0.2 mg/dL   Indirect Bilirubin 0.6 0.3 - 0.9 mg/dL    Comment: Performed at [facility], [address] [place], [place] [address]  CBC with Differential/Platelet     Status: Abnormal   Collection Time: 05/31/18 11:58 AM  Result Value Ref Range   WBC 10.6 (H) 4.0 - 10.5 K/uL   RBC 4.95 4.22 - 5.81 MIL/uL   Hemoglobin 15.6 13.0 - 17.0 g/dL   HCT 28.4 13.2 - 44.0 %    MCV 93.1 80.0 - 100.0 fL   MCH 31.5 26.0 - 34.0 pg   MCHC 33.8 30.0 - 36.0 g/dL   RDW 10.2 72.5 - 36.6 %   Platelets 272 150 - 400 K/uL   nRBC 0.0 0.0 - 0.2 %   Neutrophils Relative % 88 %   Neutro Abs 9.3 (H) 1.7 - 7.7 K/uL   Lymphocytes Relative 7 %   Lymphs Abs 0.8 0.7 - 4.0 K/uL   Monocytes Relative 4 %   Monocytes Absolute 0.4 0.1 - 1.0 K/uL   Eosinophils Relative 1 %   Eosinophils Absolute 0.1 0.0 - 0.5 K/uL   Basophils Relative 0 %   Basophils Absolute 0.0 0.0 - 0.1 K/uL   Immature Granulocytes 0 %   Abs Immature Granulocytes 0.03 0.00 - 0.07 K/uL  Comment: Performed at [facility], [address] [place], [place] [address]  Urinalysis, Complete w Microscopic     Status: Abnormal   Collection Time: 05/31/18 12:37 PM  Result Value Ref Range   Color, Urine YELLOW (A) YELLOW   APPearance CLEAR (A) CLEAR   Specific Gravity, Urine 1.015 1.005 - 1.030   pH 6.0 5.0 - 8.0   Glucose, UA NEGATIVE NEGATIVE mg/dL   Hgb urine dipstick NEGATIVE NEGATIVE   Bilirubin Urine NEGATIVE NEGATIVE   Ketones, ur 5 (A) NEGATIVE mg/dL   Protein, ur NEGATIVE NEGATIVE mg/dL   Nitrite NEGATIVE NEGATIVE   Leukocytes,Ua NEGATIVE NEGATIVE   RBC / HPF 0-5 0 - 5 RBC/hpf   WBC, UA 0-5 0 - 5 WBC/hpf   Bacteria, UA NONE SEEN NONE SEEN   Squamous Epithelial / LPF NONE SEEN 0 - 5   Mucus PRESENT     Comment: Performed at [facility], [address] [place], [place] [address]  Lactic acid, plasma     Status: None   Collection Time: 05/31/18 12:37 PM  Result Value Ref Range   Lactic Acid, Venous 1.1 0.5 - 1.9 mmol/L    Comment: Performed at [facility], [address] [place], [place] [address]  SARS Coronavirus 2 (CEPHEID- Performed in [facility] lab), Hosp Order     Status: None   Collection Time: 05/31/18 12:38 PM  Result Value Ref Range   SARS Coronavirus 2 NEGATIVE NEGATIVE    Comment: (NOTE) If result is NEGATIVE SARS-CoV-2 target nucleic acids  are NOT DETECTED. The SARS-CoV-2 RNA is generally detectable in upper and lower  respiratory specimens during the acute phase of infection. The lowest  concentration of SARS-CoV-2 viral copies this assay can detect is 250  copies / mL. A negative result does not preclude SARS-CoV-2 infection  and should not be used as the sole basis for treatment or other  patient management decisions.  A negative result may occur with  improper specimen collection / handling, submission of specimen other  than nasopharyngeal swab, presence of viral mutation(s) within the  areas targeted by this assay, and inadequate number of viral copies  (<250 copies / mL). A negative result must be combined with clinical  observations, patient history, and epidemiological information. If result is POSITIVE SARS-CoV-2 target nucleic acids are DETECTED. The SARS-CoV-2 RNA is generally detectable in upper and lower  respiratory specimens dur ing the acute phase of infection.  Positive  results are indicative of active infection with SARS-CoV-2.  Clinical  correlation with patient history and other diagnostic information is  necessary to determine patient infection status.  Positive results do  not rule out bacterial infection or co-infection with other viruses. If result is PRESUMPTIVE POSTIVE SARS-CoV-2 nucleic acids MAY BE PRESENT.   A presumptive positive result was obtained on the submitted specimen  and confirmed on repeat testing.  While 2019 novel coronavirus  (SARS-CoV-2) nucleic acids may be present in the submitted sample  additional confirmatory testing may be necessary for epidemiological  and / or clinical management purposes  to differentiate between  SARS-CoV-2 and other Sarbecovirus currently known to infect humans.  If clinically indicated additional testing with an alternate test  methodology [id number]) is advised. The SARS-CoV-2 RNA is generally  detectable in upper and lower respiratory sp ecimens  during the acute  phase of infection. The expected result is Negative. Fact Sheet for Patients:  BoilerBrush.com.cyhttps://www.fda.gov/media/136312/download Fact Sheet for Healthcare Providers: https://pope.com/https://www.fda.gov/media/136313/download This test  is not yet approved or cleared by the [place] and has been authorized for detection and/or diagnosis of SARS-CoV-2 by FDA under an Emergency Use Authorization (EUA).  This EUA will remain in effect (meaning this test can be used) for the duration of the COVID-19 declaration under Section 564(b)(1) of the Act, 21 U.S.C. section 360bbb-3(b)(1), unless the authorization is terminated or revoked sooner. Performed at [facility], [address] [place], [place] [address]    Ct Head Wo Contrast  Result Date: 05/31/2018 CLINICAL DATA:  53 year old male with dizziness EXAM: CT HEAD WITHOUT CONTRAST TECHNIQUE: Contiguous axial images were obtained from the base of the skull through the vertex without intravenous contrast. COMPARISON:  None. FINDINGS: Brain: No acute intracranial hemorrhage. No midline shift or mass effect. Gray-white differentiation maintained. Minimal periventricular hypodensity of the right parietal region. Unremarkable appearance of the ventricular system. Vascular: Intracranial atherosclerosis Skull: No acute fracture.  No aggressive bone lesion identified. Sinuses/Orbits: Unremarkable appearance of the orbits. Mastoid air cells clear. No middle ear effusion. No significant sinus disease. Other: None IMPRESSION: Negative for acute intracranial abnormality Electronically Signed   By: Gilmer Mor D.O.   On: 05/31/2018 12:45   Dg Chest Port 1 View  Result Date: 05/31/2018 CLINICAL DATA:  Nonproductive cough. EXAM: PORTABLE CHEST 1 VIEW COMPARISON:  None. FINDINGS: The heart size and mediastinal contours are within normal limits. Both lungs are clear. No pneumothorax or pleural effusion is noted. The visualized skeletal structures are  unremarkable. IMPRESSION: No active disease. Electronically Signed   By: Lupita Raider M.D.   On: 05/31/2018 12:42    Pending Labs Unresulted Labs (From admission, onward)    Start     Ordered   05/31/18 1413  Troponin I - Add-On to previous collection  Add-on,   AD     05/31/18 1412   05/31/18 1411  Urine Drug Screen, Qualitative  Add-on,   AD     05/31/18 1410   05/31/18 1409  Ethanol  Add-on,   AD     05/31/18 1408   05/31/18 1213  Lactic acid, plasma  STAT Now then every 3 hours,   STAT     05/31/18 1215   05/31/18 1213  Blood Culture (routine x 2)  BLOOD CULTURE X 2,   STAT     05/31/18 1215   05/31/18 1213  Urine culture  ONCE - STAT,   STAT     05/31/18 1215   Signed and Held  HIV antibody (Routine Testing)  Once,   R     Signed and Held   Signed and Held  Basic metabolic panel  Tomorrow morning,   R     Signed and Held   Medical illustrator and Held  CBC  Tomorrow morning,   R     Signed and Held   Signed and Held  Troponin I - Now Then Q6H  Now then every 6 hours,   R     Signed and Held   Signed and Held  Influenza panel by PCR (type A & B)  (Influenza PCR Panel)  Once,   R     Signed and Held   Signed and Held  TSH  Tomorrow morning,   R     Signed and Held          Vitals/Pain Today's Vitals   05/31/18 1209 05/31/18 1300 05/31/18 1330 05/31/18 1429  BP:  (!) 175/98 (!) 211/114   Pulse:   Marland Kitchen)  59   Resp:  11 10   Temp: (!) 95.3 F (35.2 C)   (!) 97.5 F (36.4 C)  TempSrc: Rectal   Oral  SpO2:   100%   Weight:      Height:      PainSc:        Isolation Precautions Airborne and Contact precautions  Medications Medications  sodium chloride 0.9 % bolus 1,000 mL (1,000 mLs Intravenous New Bag/Given 05/31/18 1429)    And  sodium chloride 0.9 % bolus 1,000 mL (0 mLs Intravenous Stopped 05/31/18 1413)    And  sodium chloride 0.9 % bolus 1,000 mL (0 mLs Intravenous Stopped 05/31/18 1413)  cefTRIAXone (ROCEPHIN) 2 g in sodium chloride 0.9 % 100 mL IVPB (0 g Intravenous  Stopped 05/31/18 1335)  azithromycin (ZITHROMAX) 500 mg in sodium chloride 0.9 % 250 mL IVPB (0 mg Intravenous Stopped 05/31/18 1430)  ondansetron (ZOFRAN) injection 4 mg (4 mg Intravenous Given 05/31/18 1333)    Mobility walks Low fall risk   Focused Assessments Sepsis   R Recommendations: See Admitting Provider Note  Report given to:   Additional Notes:

## 2018-05-31 NOTE — ED Provider Notes (Signed)
[facility] Emergency Department Provider Note  ____________________________________________  Time seen: Approximately 2:06 PM  I have reviewed the triage vital signs and the nursing notes.   HISTORY  Chief Complaint Cough and Dizziness    Level 5 Caveat: Portions of the History and Physical including HPI and review of systems are unable to be completely obtained due to patient being a poor historian   HPI Gerald Martin is a 53 y.o. male with a history of hypertension GERD, depression who comes to the ED via EMS due to nonproductive cough and feeling dizzy and generalized weakness today.  Patient denies chest pain but does feel short of breath.  He denies abdominal pain back pain or trauma.      Past Medical History:  Diagnosis Date  . Hypertension   Medications Reconcile with Patient's Chart Medication Sig Dispensed Refills Start Date End Date Status  ipratropium-albuterol (COMBIVENT) 18-103 mcg/actuation inhaler  Inhale 2 inhalations into the lungs 4 (four) times daily.  0   Active  LORazepam (ATIVAN) 1 MG tablet  Take 1 mg by mouth every 8 (eight) hours as needed for Anxiety.  0   Active  lisinopril-hydrochlorothiazide (PRINZIDE,ZESTORETIC) 20-12.5 mg tablet  Take 2 tablets by mouth once daily.   0   Active  FLUoxetine (PROZAC) 20 MG capsule  Take 20 mg by mouth 3 (three) times a day.  0   Active  ranitidine (ZANTAC) 150 MG capsule  Take 150 mg by mouth 2 (two) times daily.  0   Active  traMADol (ULTRAM) 50 mg tablet  Take 50 mg by mouth every 6 (six) hours as needed for Pain.  0   Active  acetaminophen (TYLENOL) 325 MG tablet  Take by mouth.  0 05/21/2015  Active  multivitamin with iron-minerals (VITAMINS AND MINERALS) tablet  Take by mouth.  0 05/21/2015  Active  QUEtiapine (SEROQUEL) 100 MG tablet    0 05/31/2015  Active  traZODone (DESYREL) 50 MG tablet    0 03/19/2015  Active   Active Problems  Problem Noted  Date  Adjustment disorder with depressed mood 05/20/2015  Patient's noncompliance with other medical treatment and regimen 05/20/2015  Major depressive disorder, recurrent severe without psychotic features 05/20/2015  Essential (primary) hypertension 05/19/2015  Shortness of breath 05/07/2014  Tobacco abuse 05/07/2014  Major depressive disorder 05/07/2014  Esophageal reflux 05/07/2014  Iron deficiency anemia, unspecified 05/07/2014  Benign essential hypertension       There are no active problems to display for this patient.    History reviewed. No pertinent surgical history.   Prior to Admission medications   Not on File     Allergies Patient has no allergy information on record.   No family history on file.  Social History Social History   Tobacco Use  . Smoking status: Current Every Day Smoker    Packs/day: 1.00  . Smokeless tobacco: Never Used  Substance Use Topics  . Alcohol use: Yes  . Drug use: Never    Review of Systems Level 5 Caveat: Portions of the History and Physical including HPI and review of systems are unable to be completely obtained due to patient being a poor historian   Constitutional:   No known fever.  ENT:   No rhinorrhea. Cardiovascular:   No chest pain or syncope. Respiratory:   No dyspnea or cough. Gastrointestinal:   Negative for abdominal pain, vomiting and diarrhea.  Musculoskeletal:   Negative for focal pain or swelling ____________________________________________   PHYSICAL EXAM:  VITAL SIGNS: ED Triage Vitals  Enc Vitals Group     BP 05/31/18 1151 (!) 190/102     Pulse Rate 05/31/18 1151 (!) 49     Resp 05/31/18 1151 10     Temp 05/31/18 1209 (!) 95.3 F (35.2 C)     Temp Source 05/31/18 1209 Rectal     SpO2 05/31/18 1151 99 %     Weight 05/31/18 1147 190 lb (86.2 kg)     Height 05/31/18 1147  (1.93 m)     Head Circumference --      Peak Flow --      Pain Score 05/31/18 1147 0     Pain Loc --      Pain  Edu? --      Excl. in GC? --     Vital signs reviewed, nursing assessments reviewed.   Constitutional:   Alert and oriented.  Ill-appearing Eyes:   Conjunctivae are normal. EOMI. PERRL. ENT      Head:   Normocephalic and atraumatic.      Nose:   No congestion/rhinnorhea.       Mouth/Throat:   Dry mucous membranes, no pharyngeal erythema. No peritonsillar mass.       Neck:   No meningismus. Full ROM. Hematological/Lymphatic/Immunilogical:   No cervical lymphadenopathy. Cardiovascular:   Bradycardia heart rate 40. Symmetric bilateral radial and DP pulses.  No murmurs. Cap refill less than 2 seconds. Respiratory:   Normal respiratory effort without tachypnea/retractions. Breath sounds are clear and equal bilaterally. No wheezes/rales/rhonchi. Gastrointestinal:   Soft and nontender. Non distended. There is no CVA tenderness.  No rebound, rigidity, or guarding. Musculoskeletal:   Normal range of motion in all extremities. No joint effusions.  No lower extremity tenderness.  No edema. Neurologic:   Slow speech, normal language.  Motor grossly intact. No acute focal neurologic deficits are appreciated.  Skin:    Skin is warm, dry and intact. No rash noted.  No petechiae, purpura, or bullae.  ____________________________________________    LABS (pertinent positives/negatives) (all labs ordered are listed, but only abnormal results are displayed) Labs Reviewed  BASIC METABOLIC PANEL - Abnormal; Notable for the following components:      Result Value   Glucose, Bld 116 (*)    All other components within normal limits  CBC - Abnormal; Notable for the following components:   WBC 10.9 (*)    All other components within normal limits  URINALYSIS, COMPLETE (UACMP) WITH MICROSCOPIC - Abnormal; Notable for the following components:   Color, Urine YELLOW (*)    APPearance CLEAR (*)    Ketones, ur 5 (*)    All other components within normal limits  CBC WITH DIFFERENTIAL/PLATELET - Abnormal;  Notable for the following components:   WBC 10.6 (*)    Neutro Abs 9.3 (*)    All other components within normal limits  CULTURE, BLOOD (ROUTINE X 2)  CULTURE, BLOOD (ROUTINE X 2)  URINE CULTURE  SARS CORONAVIRUS 2 (HOSPITAL ORDER, PERFORMED IN [facility])  LACTIC ACID, PLASMA  HEPATIC FUNCTION PANEL  LACTIC ACID, PLASMA  PROCALCITONIN  CBG MONITORING, ED   ____________________________________________   EKG  Interpreted by me Sinus bradycardia rate of 45, normal axis and intervals.  Normal QRS ST segments and T waves.  ____________________________________________    RADIOLOGY  Ct Head Wo Contrast  Result Date: 05/31/2018 CLINICAL DATA:  53 year old male with dizziness EXAM: CT HEAD WITHOUT CONTRAST TECHNIQUE: Contiguous axial images were obtained from the  base of the skull through the vertex without intravenous contrast. COMPARISON:  None. FINDINGS: Brain: No acute intracranial hemorrhage. No midline shift or mass effect. Gray-white differentiation maintained. Minimal periventricular hypodensity of the right parietal region. Unremarkable appearance of the ventricular system. Vascular: Intracranial atherosclerosis Skull: No acute fracture.  No aggressive bone lesion identified. Sinuses/Orbits: Unremarkable appearance of the orbits. Mastoid air cells clear. No middle ear effusion. No significant sinus disease. Other: None IMPRESSION: Negative for acute intracranial abnormality Electronically Signed   By: Gilmer Mor D.O.   On: 05/31/2018 12:45   Dg Chest Port 1 View  Result Date: 05/31/2018 CLINICAL DATA:  Nonproductive cough. EXAM: PORTABLE CHEST 1 VIEW COMPARISON:  None. FINDINGS: The heart size and mediastinal contours are within normal limits. Both lungs are clear. No pneumothorax or pleural effusion is noted. The visualized skeletal structures are unremarkable. IMPRESSION: No active disease. Electronically Signed   By: Lupita Raider M.D.   On: 05/31/2018 12:42     ____________________________________________   PROCEDURES .Critical Care Performed by: Sharman Cheek, MD Authorized by: Sharman Cheek, MD   Critical care provider statement:    Critical care time (minutes):  35   Critical care time was exclusive of:  Separately billable procedures and treating other patients   Critical care was necessary to treat or prevent imminent or life-threatening deterioration of the following conditions:  Sepsis   Critical care was time spent personally by me on the following activities:  Development of treatment plan with patient or surrogate, discussions with consultants, evaluation of patient's response to treatment, examination of patient, obtaining history from patient or surrogate, ordering and performing treatments and interventions, ordering and review of laboratory studies, ordering and review of radiographic studies, pulse oximetry, re-evaluation of patient's condition and review of old charts    ____________________________________________  DIFFERENTIAL DIAGNOSIS   Pneumonia, pulmonary edema, intracranial hypertension, intracranial hemorrhage, sepsis, dehydration, electrolyte disturbance, stroke  CLINICAL IMPRESSION / ASSESSMENT AND PLAN / ED COURSE  Pertinent labs & imaging results that were available during my care of the patient were reviewed by me and considered in my medical decision making (see chart for details).   Gerald Martin was evaluated in Emergency Department on 05/31/2018 for the symptoms described in the history of present illness. He was evaluated in the context of the global COVID-19 pandemic, which necessitated consideration that the patient might be at risk for infection with the SARS-CoV-2 virus that causes COVID-19. Institutional protocols and algorithms that pertain to the evaluation of patients at risk for COVID-19 are in a state of rapid change based on information released by regulatory bodies including the CDC and federal  and state organizations. These policies and algorithms were followed during the patient's care in the ED.     Clinical Course as of May 30 1404  Fri May 31, 2018  1216 Patient presents with generalized weakness, nonreassuring appearance, bradycardia and hypothermia, will initiate sepsis work-up give ceftriaxone and azithromycin.  COVID test, CT head due to possible Cushing's reflex.   [PS]  1404 Labs, CT head, chest x-ray all unremarkable so far.  Patient will require hospitalization for symptomatic bradycardia in the absence of other significant findings.  COVID test still  pending.   [PS]    Clinical Course User Index [PS] Sharman Cheek, MD    ----------------------------------------- 2:10 PM on 05/31/2018 -----------------------------------------  Ethanol test added as well.  Will add UDS.  Will discuss with hospitalist for further evaluation.   ____________________________________________   FINAL CLINICAL IMPRESSION(S) /  ED DIAGNOSES    Final diagnoses:  Symptomatic bradycardia  Hypothermia, initial encounter  Sepsis, due to unspecified organism, unspecified whether acute organ dysfunction present [facility])     ED Discharge Orders    None      Portions of this note were generated with dragon dictation software. Dictation errors may occur despite best attempts at proofreading.   Sharman Cheek, MD 05/31/18 1410

## 2018-05-31 NOTE — ED Notes (Signed)
Pt given water at this time 

## 2018-05-31 NOTE — H&P (Addendum)
[facility] [place] [facility]   PATIENT NAME: Gerald Martin    MR#:  [id number]  DATE OF BIRTH:  09-05-1965  DATE OF ADMISSION:  05/31/2018  PRIMARY CARE PHYSICIAN: Lyndon Code, MD   REQUESTING/REFERRING PHYSICIAN: Sharman Cheek, MD  CHIEF COMPLAINT:   Chief Complaint  Patient presents with   Cough   Dizziness    HISTORY OF PRESENT ILLNESS:  Gerald Martin  is a 53 y.o. male with a known history of hypertension who presented to the ED with generalized weakness and dizziness that started yesterday.  He states that he feels very off balance whenever he tries to walk around, and he is worried he is going to fall.  He feels like the room is spinning.  He denies any loss of consciousness, head trauma, visual changes.  He denies any focal weakness, numbness, or tingling in his upper or lower extremities.  No headaches.  He states he has had dizziness on and off in the past.  In the ED, he was mildly hypothermic with temperature 95.3 F.  He was hypertensive with BP 190/102.  He was initially bradycardic with heart rates in the high 40s, but this resolved spontaneously and heart rates were in the low 60s after that.  Labs were significant for WBC 10.6.  UA negative.  COVID testing negative.  Chest x-ray negative.  CT head negative.  Hospitalists were called for admission.  PAST MEDICAL HISTORY:   Past Medical History:  Diagnosis Date   Hypertension     PAST SURGICAL HISTORY:  History reviewed. No pertinent surgical history.  SOCIAL HISTORY:   Social History   Tobacco Use   Smoking status: Current Every Day Smoker    Packs/day: 1.00   Smokeless tobacco: Never Used  Substance Use Topics   Alcohol use: Yes    FAMILY HISTORY:  Father-heart disease  DRUG ALLERGIES:  Not on File  REVIEW OF SYSTEMS:   Review of Systems  Constitutional: Negative for chills and fever.  HENT: Negative for congestion and sore throat.   Eyes: Negative for blurred  vision and double vision.  Respiratory: Negative for cough and shortness of breath.   Cardiovascular: Negative for chest pain and palpitations.  Gastrointestinal: Negative for nausea and vomiting.  Genitourinary: Negative for dysuria and urgency.  Musculoskeletal: Negative for back pain and neck pain.  Neurological: Positive for dizziness and weakness. Negative for tingling, focal weakness and headaches.  Psychiatric/Behavioral: Negative for depression. The patient is not nervous/anxious.     MEDICATIONS AT HOME:   Prior to Admission medications   Not on File      VITAL SIGNS:  Blood pressure (!) 190/102, pulse (!) 49, temperature (!) 95.3 F (35.2 C), temperature source Rectal, resp. rate 10, height 6\' 4"  (1.93 m), weight 86.2 kg, SpO2 99 %.  PHYSICAL EXAMINATION:  Physical Exam  GENERAL:  53 y.o.-year-old patient lying in the bed with no acute distress.  Tired appearing, but nontoxic. EYES: Pupils equal, round, reactive to light and accommodation. No scleral icterus. Extraocular muscles intact.  HEENT: Head atraumatic, normocephalic. Oropharynx and nasopharynx clear.  NECK:  Supple, no jugular venous distention. No thyroid enlargement, no tenderness.  LUNGS: Normal breath sounds bilaterally, no wheezing, rales,rhonchi or crepitation. No use of accessory muscles of respiration.  CARDIOVASCULAR: RRR, S1, S2 normal. No murmurs, rubs, or gallops.  ABDOMEN: Soft, nontender, nondistended. Bowel sounds present. No organomegaly or mass.  EXTREMITIES: No pedal edema, cyanosis, or clubbing.  NEUROLOGIC: +persistent  leftward horizontal nystagmus noted. +global weakness. Sensation intact throughout.  Normal finger-to-nose testing bilaterally. PSYCHIATRIC: The patient is alert and oriented x 3.  SKIN: No obvious rash, lesion, or ulcer.   LABORATORY PANEL:   CBC Recent Labs  Lab 05/31/18 1158  WBC 10.6*   10.9*  HGB 15.6   15.5  HCT 46.1   45.9  PLT 272   261    ------------------------------------------------------------------------------------------------------------------  Chemistries  Recent Labs  Lab 05/31/18 1158  NA 140  K 3.9  CL 106  CO2 25  GLUCOSE 116*  BUN 12  CREATININE 0.71  CALCIUM 9.1  AST 23  ALT 35  ALKPHOS 64  BILITOT 0.8   ------------------------------------------------------------------------------------------------------------------  Cardiac Enzymes No results for input(s): TROPONINI in the last 168 hours. ------------------------------------------------------------------------------------------------------------------  RADIOLOGY:  Ct Head Wo Contrast  Result Date: 05/31/2018 CLINICAL DATA:  53 year old male with dizziness EXAM: CT HEAD WITHOUT CONTRAST TECHNIQUE: Contiguous axial images were obtained from the base of the skull through the vertex without intravenous contrast. COMPARISON:  None. FINDINGS: Brain: No acute intracranial hemorrhage. No midline shift or mass effect. Gray-white differentiation maintained. Minimal periventricular hypodensity of the right parietal region. Unremarkable appearance of the ventricular system. Vascular: Intracranial atherosclerosis Skull: No acute fracture.  No aggressive bone lesion identified. Sinuses/Orbits: Unremarkable appearance of the orbits. Mastoid air cells clear. No middle ear effusion. No significant sinus disease. Other: None IMPRESSION: Negative for acute intracranial abnormality Electronically Signed   By: Gilmer MorJaime  Wagner D.O.   On: 05/31/2018 12:45   Dg Chest Port 1 View  Result Date: 05/31/2018 CLINICAL DATA:  Nonproductive cough. EXAM: PORTABLE CHEST 1 VIEW COMPARISON:  None. FINDINGS: The heart size and mediastinal contours are within normal limits. Both lungs are clear. No pneumothorax or pleural effusion is noted. The visualized skeletal structures are unremarkable. IMPRESSION: No active disease. Electronically Signed   By: Lupita RaiderJames  Green Jr M.D.   On: 05/31/2018  12:42      IMPRESSION AND PLAN:   Vertigo and leftward horizontal nystagmus- likely an inner ear problem, possibly BPPV or vestibular neuritis.  CT head negative. -Will obtain MRI brain to rule out stroke -Start scheduled meclizine -Will try prednisone taper to see if this helps -PT consult  Bradycardia- likely due to coreg. Has resolved spontaneously. -Trend troponins -Hold coreg for now, will likely need to discharge on a decreased dose -Check TSH -Cardiac monitoring  Hypothermia- code sepsis called in the ED, although patient not meeting sepsis criteria.  No leukocytosis, tachycardia, tachypnea, or lactic acidosis. No infectious symptoms. -Received ceftriaxone and azithromycin in the ED, will hold on any additional antibiotics -Follow-up blood cultures -Check influenza -Check TSH  Hypertension- blood pressures elevated in the ED -Continue home Norvasc and lisinopril -Holding home Coreg in the setting of bradycardia -Hydralazine prn  COPD-stable, no signs of acute exacerbation -Continue home inhalers  Major depressive disorder -Continue home Prozac and Seroquel  All the records are reviewed and case discussed with ED provider. Management plans discussed with the patient, family and they are in agreement.  CODE STATUS: Full  TOTAL TIME TAKING CARE OF THIS PATIENT: 45 minutes.    Jinny BlossomKaty D Asheton Viramontes M.D on 05/31/2018 at 2:26 PM  Between 7am to 6pm - Pager [phone]- [phone]  After 6pm go to [email or web address] - password [email or web address] [facility]  [facility] [place] [facility]  Office  [phone]  CC: Primary care physician; Lyndon CodeKhan, Fozia M, MD   Note: This dictation was prepared with Dragon dictation along with smaller phrase technology.  Any transcriptional errors that result from this process are unintentional.

## 2018-05-31 NOTE — ED Notes (Signed)
Patient transported to CT 

## 2018-06-01 ENCOUNTER — Inpatient Hospital Stay: Payer: Medicare Other

## 2018-06-01 ENCOUNTER — Inpatient Hospital Stay: Admit: 2018-06-01 | Payer: Medicare Other

## 2018-06-01 DIAGNOSIS — I639 Cerebral infarction, unspecified: Principal | ICD-10-CM

## 2018-06-01 LAB — BASIC METABOLIC PANEL
Anion gap: 7 (ref 5–15)
BUN: 14 mg/dL (ref 6–20)
CO2: 25 mmol/L (ref 22–32)
Calcium: 8.3 mg/dL — ABNORMAL LOW (ref 8.9–10.3)
Chloride: 105 mmol/L (ref 98–111)
Creatinine, Ser: 0.72 mg/dL (ref 0.61–1.24)
GFR calc Af Amer: >60 mL/min (ref >60)
GFR calc non Af Amer: >60 mL/min (ref >60)
Glucose, Bld: 175 mg/dL — ABNORMAL HIGH (ref 70–99)
Potassium: 4.1 mmol/L (ref 3.5–5.1)
Sodium: 137 mmol/L (ref 135–145)

## 2018-06-01 LAB — CBC
HCT: 43.5 % (ref 39.0–52.0)
Hemoglobin: 14.7 g/dL (ref 13.0–17.0)
MCH: 31.1 pg (ref 26.0–34.0)
MCHC: 33.8 g/dL (ref 30.0–36.0)
MCV: 92.2 fL (ref 80.0–100.0)
Platelets: 250 10*3/uL (ref 150–400)
RBC: 4.72 MIL/uL (ref 4.22–5.81)
RDW: 12.9 % (ref 11.5–15.5)
WBC: 7.8 10*3/uL (ref 4.0–10.5)
nRBC: 0 % (ref 0.0–0.2)

## 2018-06-01 LAB — TSH: TSH: 0.301 u[IU]/mL — ABNORMAL LOW (ref 0.350–4.500)

## 2018-06-01 LAB — LIPID PANEL
Cholesterol: 144 mg/dL (ref 0–200)
HDL: 45 mg/dL (ref >40)
LDL Cholesterol: 91 mg/dL (ref 0–99)
Total CHOL/HDL Ratio: 3.2 RATIO
Triglycerides: 39 mg/dL (ref <150)
VLDL: 8 mg/dL (ref 0–40)

## 2018-06-01 LAB — TROPONIN I
Troponin I: <0.03 ng/mL (ref <0.03)
Troponin I: <0.03 ng/mL (ref <0.03)

## 2018-06-01 LAB — URINE CULTURE: Culture: NO GROWTH

## 2018-06-01 LAB — HEMOGLOBIN A1C
Hgb A1c MFr Bld: 5.6 % (ref 4.8–5.6)
Mean Plasma Glucose: 114.02 mg/dL

## 2018-06-01 LAB — T4, FREE: Free T4: 1 ng/dL (ref 0.82–1.77)

## 2018-06-01 MED ORDER — GADOBUTROL 1 MMOL/ML IV SOLN
7.0000 mL | Freq: Once | INTRAVENOUS | Status: AC | PRN
  Administered 2018-06-01: 11:00:00 7 mL via INTRAVENOUS

## 2018-06-01 MED ORDER — SODIUM CHLORIDE 0.9% FLUSH
3.0000 mL | Freq: Two times a day (BID) | INTRAVENOUS | Status: DC
  Administered 2018-06-01 – 2018-06-03 (×5): 3 mL via INTRAVENOUS

## 2018-06-01 MED ORDER — SODIUM CHLORIDE 0.9% FLUSH: 3.0000 mL | INTRAVENOUS | Status: DC | PRN

## 2018-06-01 MED ORDER — ASPIRIN 325 MG PO TABS
325.0000 mg | ORAL_TABLET | Freq: Every day | ORAL | Status: DC
  Filled 2018-06-01: qty 1

## 2018-06-01 MED ORDER — CLOPIDOGREL BISULFATE 75 MG PO TABS
75.0000 mg | ORAL_TABLET | Freq: Every day | ORAL | Status: DC
  Administered 2018-06-01 – 2018-06-03 (×3): 75 mg via ORAL
  Filled 2018-06-01 (×4): qty 1

## 2018-06-01 MED ORDER — MECLIZINE HCL 25 MG PO TABS
25.0000 mg | ORAL_TABLET | Freq: Three times a day (TID) | ORAL | Status: DC | PRN
  Filled 2018-06-01 (×2): qty 1

## 2018-06-01 MED ORDER — ASPIRIN EC 81 MG PO TBEC
81.0000 mg | DELAYED_RELEASE_TABLET | Freq: Every day | ORAL | Status: DC
  Administered 2018-06-01 – 2018-06-03 (×3): 81 mg via ORAL
  Filled 2018-06-01 (×3): qty 1

## 2018-06-01 MED ORDER — ATORVASTATIN CALCIUM 20 MG PO TABS
40.0000 mg | ORAL_TABLET | Freq: Every day | ORAL | Status: DC
  Administered 2018-06-01 – 2018-06-02 (×2): 40 mg via ORAL
  Filled 2018-06-01 (×2): qty 2

## 2018-06-01 NOTE — Plan of Care (Signed)
  Problem: Safety: Goal: Ability to remain free from injury will improve Outcome: Progressing   

## 2018-06-01 NOTE — Progress Notes (Signed)
[employer] [facility] [place] [facility]   PATIENT NAME: Gerald Martin    MR#:  [id number]  DATE OF BIRTH:  1965-09-20  SUBJECTIVE:  CHIEF COMPLAINT:   Chief Complaint  Patient presents with  . Cough  . Dizziness    REVIEW OF SYSTEMS:  CONSTITUTIONAL: No fever, fatigue or weakness.  EYES: No blurred or double vision.  EARS, NOSE, AND THROAT: No tinnitus or ear pain.  RESPIRATORY: No cough, shortness of breath, wheezing or hemoptysis.  CARDIOVASCULAR: No chest pain, orthopnea, edema.  GASTROINTESTINAL: No nausea, vomiting, diarrhea or abdominal pain.  GENITOURINARY: No dysuria, hematuria.  ENDOCRINE: No polyuria, nocturia,  HEMATOLOGY: No anemia, easy bruising or bleeding SKIN: No rash or lesion. MUSCULOSKELETAL: No joint pain or arthritis.   NEUROLOGIC: No tingling, numbness, weakness.  PSYCHIATRY: No anxiety or depression.   ROS  DRUG ALLERGIES:  Not on File  VITALS:  Blood pressure (!) 142/88, pulse (!) 53, temperature 97.9 F (36.6 C), temperature source Oral, resp. rate 19, height  (1.93 m), weight 74.8 kg, SpO2 98 %.  PHYSICAL EXAMINATION:  GENERAL:  53 y.o.-year-old patient lying in the bed with no acute distress.  EYES: Pupils equal, round, reactive to light and accommodation. No scleral icterus. Extraocular muscles intact.  HEENT: Head atraumatic, normocephalic. Oropharynx and nasopharynx clear.  NECK:  Supple, no jugular venous distention. No thyroid enlargement, no tenderness.  LUNGS: Normal breath sounds bilaterally, no wheezing, rales,rhonchi or crepitation. No use of accessory muscles of respiration.  CARDIOVASCULAR: S1, S2 normal. No murmurs, rubs, or gallops.  ABDOMEN: Soft, nontender, nondistended. Bowel sounds present. No organomegaly or mass.  EXTREMITIES: No pedal edema, cyanosis, or clubbing.  NEUROLOGIC: Cranial nerves II through XII are intact. Muscle strength 5/5 in all extremities. Sensation intact. Gait not checked.  PSYCHIATRIC:  The patient is alert and oriented x 3.  SKIN: No obvious rash, lesion, or ulcer.   Physical Exam LABORATORY PANEL:   CBC Recent Labs  Lab 06/01/18 0052  WBC 7.8  HGB 14.7  HCT 43.5  PLT 250   ------------------------------------------------------------------------------------------------------------------  Chemistries  Recent Labs  Lab 05/31/18 1158 06/01/18 0052  NA 140 137  K 3.9 4.1  CL 106 105  CO2 25 25  GLUCOSE 116* 175*  BUN 12 14  CREATININE 0.71 0.72  CALCIUM 9.1 8.3*  AST 23  --   ALT 35  --   ALKPHOS 64  --   BILITOT 0.8  --    ------------------------------------------------------------------------------------------------------------------  Cardiac Enzymes Recent Labs  Lab 06/01/18 0052 06/01/18 0648  TROPONINI <0.03 <0.03   ------------------------------------------------------------------------------------------------------------------  RADIOLOGY:  Ct Head Wo Contrast  Result Date: 05/31/2018 CLINICAL DATA:  53 year old male with dizziness EXAM: CT HEAD WITHOUT CONTRAST TECHNIQUE: Contiguous axial images were obtained from the base of the skull through the vertex without intravenous contrast. COMPARISON:  None. FINDINGS: Brain: No acute intracranial hemorrhage. No midline shift or mass effect. Gray-white differentiation maintained. Minimal periventricular hypodensity of the right parietal region. Unremarkable appearance of the ventricular system. Vascular: Intracranial atherosclerosis Skull: No acute fracture.  No aggressive bone lesion identified. Sinuses/Orbits: Unremarkable appearance of the orbits. Mastoid air cells clear. No middle ear effusion. No significant sinus disease. Other: None IMPRESSION: Negative for acute intracranial abnormality Electronically Signed   By: Gilmer Mor D.O.   On: 05/31/2018 12:45   Mr Maxine Glenn Head Wo Contrast  Result Date: 06/01/2018 CLINICAL DATA:  Dizziness and ataxia. 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 EXAM: MRA NECK  WITHOUT AND WITH  CONTRAST MRA HEAD WITHOUT CONTRAST TECHNIQUE: Multiplanar and multiecho pulse sequences of the neck were obtained without and with intravenous contrast. Angiographic images of the neck were obtained using MRA technique without and with intravenous contast.; Angiographic images of the Circle of Willis were obtained using MRA technique without intravenous contrast. CONTRAST:  7 mL Gadavist COMPARISON:  None. FINDINGS: MRA NECK FINDINGS There is a normal variant aortic arch branching pattern with common origin of brachiocephalic and left common carotid arteries. The brachiocephalic and subclavian arteries are widely patent. The cervical carotid arteries are patent and smooth bilaterally without evidence of stenosis or dissection. The vertebral arteries are patent and codominant with antegrade flow bilaterally. Mild motion artifact is noted through the vertebral artery origins, predominantly on the right which limits assessment for an underlying stenosis on the right. Elsewhere, the vertebral arteries are smooth without evidence of significant stenosis or dissection. MRA HEAD FINDINGS The study is moderately motion degraded despite repeat imaging. The intracranial vertebral arteries are widely patent to the basilar. The left PICA is patent. Right PICA is not well seen. Right AICA appears dominant. SCAs are not well evaluated due to motion artifact. The basilar artery is patent with motion artifact through its mid and distal portions but no stenosis on the contrast-enhanced neck MRA. There is a large left posterior communicating artery with a suspected smaller posterior communicating artery on the right. Both PCAs are patent without definite flow limiting proximal stenosis within limitations of motion artifact. The internal carotid arteries are patent from skull base to carotid termini with assessment limited by motion though without evidence of significant stenosis through the proximal supraclinoid  segments on the contrast-enhanced neck MRA. ACAs and MCAs are grossly patent with motion artifact precluding assessment for stenosis. No aneurysm is identified. IMPRESSION: 1. Negative neck MRA aside from suboptimal assessment of the right vertebral artery origin due to mild motion artifact. 2. Motion degraded head MRA. No evidence of large vessel occlusion. No significant vertebrobasilar or intracranial ICA stenosis on neck MRA. Limited assessment for ACA, MCA, and PCA stenosis. Electronically Signed   By: Sebastian Ache M.D.   On: 06/01/2018 11:54   Mr Maxine Glenn Neck W Wo Contrast  Result Date: 06/01/2018 CLINICAL DATA:  Dizziness and ataxia. 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 EXAM: MRA NECK WITHOUT AND WITH CONTRAST MRA HEAD WITHOUT CONTRAST TECHNIQUE: Multiplanar and multiecho pulse sequences of the neck were obtained without and with intravenous contrast. Angiographic images of the neck were obtained using MRA technique without and with intravenous contast.; Angiographic images of the Circle of Willis were obtained using MRA technique without intravenous contrast. CONTRAST:  7 mL Gadavist COMPARISON:  None. FINDINGS: MRA NECK FINDINGS There is a normal variant aortic arch branching pattern with common origin of brachiocephalic and left common carotid arteries. The brachiocephalic and subclavian arteries are widely patent. The cervical carotid arteries are patent and smooth bilaterally without evidence of stenosis or dissection. The vertebral arteries are patent and codominant with antegrade flow bilaterally. Mild motion artifact is noted through the vertebral artery origins, predominantly on the right which limits assessment for an underlying stenosis on the right. Elsewhere, the vertebral arteries are smooth without evidence of significant stenosis or dissection. MRA HEAD FINDINGS The study is moderately motion degraded despite repeat imaging. The intracranial vertebral arteries are widely patent to the basilar. The  left PICA is patent. Right PICA is not well seen. Right AICA appears dominant. SCAs are not well evaluated due to motion artifact. The basilar artery  is patent with motion artifact through its mid and distal portions but no stenosis on the contrast-enhanced neck MRA. There is a large left posterior communicating artery with a suspected smaller posterior communicating artery on the right. Both PCAs are patent without definite flow limiting proximal stenosis within limitations of motion artifact. The internal carotid arteries are patent from skull base to carotid termini with assessment limited by motion though without evidence of significant stenosis through the proximal supraclinoid segments on the contrast-enhanced neck MRA. ACAs and MCAs are grossly patent with motion artifact precluding assessment for stenosis. No aneurysm is identified. IMPRESSION: 1. Negative neck MRA aside from suboptimal assessment of the right vertebral artery origin due to mild motion artifact. 2. Motion degraded head MRA. No evidence of large vessel occlusion. No significant vertebrobasilar or intracranial ICA stenosis on neck MRA. Limited assessment for ACA, MCA, and PCA stenosis. Electronically Signed   By: Sebastian Ache M.D.   On: 06/01/2018 11:54   Mr Brain Wo Contrast  Result Date: 05/31/2018 CLINICAL DATA:  Generalized weakness and dizziness with imbalance. EXAM: MRI HEAD WITHOUT CONTRAST TECHNIQUE: Multiplanar, multiecho pulse sequences of the brain and surrounding structures were obtained without intravenous contrast. COMPARISON:  Head CT 05/31/2018 FINDINGS: Brain: There is an approximately 5 mm acute infarct laterally in the medulla on the right. No intracranial hemorrhage, mass, midline shift, or extra-axial fluid collection is identified. 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 Scattered small foci of T2 hyperintensity in the cerebral white matter bilaterally are nonspecific but compatible with mild chronic small  vessel ischemic disease. A small chronic infarct is noted in the inferior aspect of the right cerebellum. There is mild cerebral atrophy. Vascular: Major intracranial vascular flow voids are preserved. Skull and upper cervical spine: Unremarkable bone marrow signal. Sinuses/Orbits: Unremarkable orbits. Chronic sinusitis involving a small, nearly completely opacified left maxillary sinus. Clear mastoid air cells. Other: None. IMPRESSION: 1. 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 2. Mild chronic small vessel ischemic disease. 3. Small chronic right cerebellar infarct. Electronically Signed   By: Sebastian Ache M.D.   On: 05/31/2018 21:49   US Carotid Bilateral  Result Date: 06/01/2018 CLINICAL DATA:  53 year old male with CVA EXAM: BILATERAL CAROTID DUPLEX ULTRASOUND TECHNIQUE: Wallace Cullens scale imaging, color Doppler and duplex ultrasound were performed of bilateral carotid and vertebral arteries in the neck. COMPARISON:  No prior duplex FINDINGS: Criteria: Quantification of carotid stenosis is based on velocity parameters that correlate the residual internal carotid diameter with NASCET-based stenosis levels, using the diameter of the distal internal carotid lumen as the denominator for stenosis measurement. The following velocity measurements were obtained: RIGHT ICA:  Systolic 45 cm/sec, Diastolic 17 cm/sec CCA:  68 cm/sec SYSTOLIC ICA/CCA RATIO:  0.7 ECA:  86 cm/sec LEFT ICA:  Systolic 62 cm/sec, Diastolic 19 cm/sec CCA:  46 cm/sec SYSTOLIC ICA/CCA RATIO:  1.4 ECA:  63 cm/sec Right Brachial SBP: Not acquired Left Brachial SBP: Not acquired RIGHT CAROTID ARTERY: No significant calcified disease of the right common carotid artery. Intermediate waveform maintained. Heterogeneous plaque without significant calcifications at the right carotid bifurcation. Low resistance waveform of the right ICA. No significant tortuosity. RIGHT VERTEBRAL ARTERY: Antegrade flow with low resistance waveform. LEFT CAROTID ARTERY: No significant  calcified disease of the left common carotid artery. Intermediate waveform maintained. Heterogeneous plaque at the left carotid bifurcation without significant calcifications. Low resistance waveform of the left ICA. LEFT VERTEBRAL ARTERY:  Antegrade flow with low resistance waveform. IMPRESSION: Color duplex indicates minimal heterogeneous  plaque, with no hemodynamically significant stenosis by duplex criteria in the extracranial cerebrovascular circulation. Signed, Yvone NeuJaime S. Reyne DumasWagner, DO, RPVI [facility] Electronically Signed   By: Gilmer MorJaime  Wagner D.O.   On: 06/01/2018 12:02   Dg Chest Port 1 View  Result Date: 05/31/2018 CLINICAL DATA:  Nonproductive cough. EXAM: PORTABLE CHEST 1 VIEW COMPARISON:  None. FINDINGS: The heart size and mediastinal contours are within normal limits. Both lungs are clear. No pneumothorax or pleural effusion is noted. The visualized skeletal structures are unremarkable. IMPRESSION: No active disease. Electronically Signed   By: Lupita RaiderJames  Green Jr M.D.   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  Start CVA protocol, neurology consulted for expert opinion, aspirin/Plavix for now, statin therapy, check echocardiogram, carotid Dopplers, MRI of the brain noted, MRA was a sub-optimal study-negative for any gross stenosis, carotid Dopplers negative for any hemodynamically significant stenosis, follow-up on echocardiogram, PT/OT/speech therapy to see, discontinue prednisone, change meclizine to PRN, aspiration/fall precautions while in house, neurochecks per routine  *Acute bradycardia  Resolved  Most likely secondary to Coreg which has been given bradycardia TSH 0.3-work-up per below   *Acute hyperthyroidism  Check T3/T4 level, thyroid ultrasound for further evaluation   *Acute  hypothermia Resolved No evidence of sepsis Influenza negative  *Chronic benign essential hypertension  Stable on current regiment  Coreg held given bradycardia   *COPD w/o exacerbation Continue home inhalers PRN  *Major depressive disorder Stable continue Prozac and Seroquel  Disposition pending clinical course in 1 to 2 days  All the records are reviewed and case discussed with Care Management/Social Workerr. Management plans discussed with the patient, family and they are in agreement.  CODE STATUS: full  TOTAL TIME TAKING CARE OF THIS PATIENT: 35 minutes.   POSSIBLE D/C IN 1-2 DAYS, DEPENDING ON CLINICAL CONDITION.   Evelena AsaMontell D Bralee Feldt M.D on 06/01/2018   Between 7am to 6pm - Pager - [phone]  After 6pm go to [email or web address] - password [email or web address] [facility]  [facility] [place] [facility]  Office  [phone]  CC: Primary care physician; Lyndon CodeKhan, Fozia M, MD  Note: This dictation was prepared with Dragon dictation along with smaller phrase technology. Any transcriptional errors that result from this process are unintentional.

## 2018-06-01 NOTE — Progress Notes (Signed)
Patient able to drink 4 ounces of water without stopping and without digfficulty.  Have not made patient NPO

## 2018-06-01 NOTE — Consult Note (Signed)
Referring Physician: Salary    Chief Complaint: Dizziness, gait imbalance  HPI: Gerald Martin is an 53 y.o. male with a history of HTN and tobacco abuse who has not been taking his medications as prescribed recently.  Is a poor historian.  Due to him being a poor historian it is unclear when his symptoms started.  On yesterday he reported they started on the day before.  Became dizzy when coming back from his friend's house.  Was off balance walking.  Went to the bathroom and became nauseous and vomited.  Felt poorly so laid down  When he awakened his symptoms were not improved and he presented for evaluation.  Initial NIHSS of 0.  Date last known well: Date: 05/30/2018 Time last known well: Unable to determine tPA Given: No: Unable to determine LKW  Past Medical History:  Diagnosis Date  . Hypertension     History reviewed. No pertinent surgical history.  No family history on file. Social History:  reports that he has been smoking. He has been smoking about 1.00 pack per day. He has never used smokeless tobacco. He reports current alcohol use. He reports that he does not use drugs.  Allergies: Not on File  Medications:  I have reviewed the patient's current medications. Prior to Admission:  Medications Prior to Admission  Medication Sig Dispense Refill Last Dose  . amLODipine (NORVASC) 10 MG tablet Take 10 mg by mouth daily.   unknown at unknown  . carvedilol (COREG) 12.5 MG tablet Take 12.5 mg by mouth 2 (two) times daily.   unknown at unknown  . COMBIVENT RESPIMAT 20-100 MCG/ACT AERS respimat Inhale 1 puff into the lungs 4 (four) times daily.   unknown at unknown  . FLUoxetine (PROZAC) 40 MG capsule Take 40 mg by mouth daily.   unknown at unknown  . lisinopril (ZESTRIL) 40 MG tablet Take 40 mg by mouth daily.   unknown at unknown  . ranitidine (ZANTAC) 150 MG tablet Take 150 mg by mouth 2 (two) times daily.   unknown at unknown  . SPIRIVA HANDIHALER 18 MCG inhalation capsule Place 1  puff into inhaler and inhale daily.   unknown at unknown  . traZODone (DESYREL) 100 MG tablet Take 100 mg by mouth at bedtime.   unknown at unknown   Scheduled: . amLODipine  10 mg Oral Daily  . aspirin  325 mg Oral Daily  . atorvastatin  40 mg Oral q1800  . enoxaparin (LOVENOX) injection  40 mg Subcutaneous Q24H  . famotidine  20 mg Oral BID  . FLUoxetine  40 mg Oral Daily  . Ipratropium-Albuterol  1 puff Inhalation QID  . lisinopril  40 mg Oral Daily  . tiotropium  18 mcg Inhalation Daily  . traZODone  100 mg Oral QHS    ROS: History obtained from the patient  General ROS: negative for - chills, fatigue, fever, night sweats, weight gain or weight loss Psychological ROS: negative for - behavioral disorder, hallucinations, memory difficulties, mood swings or suicidal ideation Ophthalmic ROS: negative for - blurry vision, double vision, eye pain or loss of vision ENT ROS: vertigo Allergy and Immunology ROS: negative for - hives or itchy/watery eyes Hematological and Lymphatic ROS: negative for - bleeding problems, bruising or swollen lymph nodes Endocrine ROS: negative for - galactorrhea, hair pattern changes, polydipsia/polyuria or temperature intolerance Respiratory ROS: negative for - cough, hemoptysis, shortness of breath or wheezing Cardiovascular ROS: negative for - chest pain, dyspnea on exertion, edema or irregular heartbeat Gastrointestinal  ROS: as noted in HPI Genito-Urinary ROS: negative for - dysuria, hematuria, incontinence or urinary frequency/urgency Musculoskeletal ROS: negative for - joint swelling or muscular weakness Neurological ROS: as noted in HPI Dermatological ROS: negative for rash and skin lesion changes  Physical Examination: Blood pressure (!) 142/88, pulse (!) 53, temperature 97.9 F (36.6 C), temperature source Oral, resp. rate 19, height 6\' 4"  (1.93 m), weight 74.8 kg, SpO2 98 %.  HEENT-  Normocephalic, no lesions, without obvious abnormality.   Normal external eye and conjunctiva.  Normal TM's bilaterally.  Normal auditory canals and external ears. Normal external nose, mucus membranes and septum.  Normal pharynx. Cardiovascular- S1, S2 normal, pulses palpable throughout   Lungs- chest clear, no wheezing, rales, normal symmetric air entry Abdomen- soft, non-tender; bowel sounds normal; no masses,  no organomegaly Extremities- no edema Lymph-no adenopathy palpable Musculoskeletal-no joint tenderness, deformity or swelling Skin-warm and dry, no hyperpigmentation, vitiligo, or suspicious lesions  Neurological Examination   Mental Status: Alert, oriented, thought content appropriate.  Speech fluent without evidence of aphasia.  Able to follow 3 step commands without difficulty. Cranial Nerves: II: Discs flat bilaterally; Visual fields grossly normal, pupils equal, round, reactive to light and accommodation III,IV, VI: ptosis not present, extra-ocular motions intact bilaterally V,VII: smile symmetric, facial light touch sensation normal bilaterally VIII: hearing normal bilaterally IX,X: gag reflex present XI: bilateral shoulder shrug XII: midline tongue extension Motor: Right : Upper extremity   5/5    Left:     Upper extremity   5/5  Lower extremity   5/5     Lower extremity   5/5 Tone and bulk:normal tone throughout; no atrophy noted Sensory: Pinprick and light touch intact throughout, bilaterally Deep Tendon Reflexes: Symmetric throughout Plantars: Right: downgoing   Left: downgoing Cerebellar: Normal finger-to-nose and normal heel-to-shin testing bilaterally Gait: not tested due to safety concerns   Laboratory Studies:  Basic Metabolic Panel: Recent Labs  Lab 05/31/18 1158 06/01/18 0052  NA 140 137  K 3.9 4.1  CL 106 105  CO2 25 25  GLUCOSE 116* 175*  BUN 12 14  CREATININE 0.71 0.72  CALCIUM 9.1 8.3*    Liver Function Tests: Recent Labs  Lab 05/31/18 1158  AST 23  ALT 35  ALKPHOS 64  BILITOT 0.8  PROT  7.0  ALBUMIN 3.6   No results for input(s): LIPASE, AMYLASE in the last 168 hours. No results for input(s): AMMONIA in the last 168 hours.  CBC: Recent Labs  Lab 05/31/18 1158 06/01/18 0052  WBC 10.6*  10.9* 7.8  NEUTROABS 9.3*  --   HGB 15.6  15.5 14.7  HCT 46.1  45.9 43.5  MCV 93.1  92.7 92.2  PLT 272  261 250    Cardiac Enzymes: Recent Labs  Lab 05/31/18 1425 05/31/18 1840 06/01/18 0052 06/01/18 0648  TROPONINI <0.03 <0.03 <0.03 <0.03    BNP: Invalid input(s): POCBNP  CBG: No results for input(s): GLUCAP in the last 168 hours.  Microbiology: Results for orders placed or performed during the hospital encounter of 05/31/18  Blood Culture (routine x 2)     Status: None (Preliminary result)   Collection Time: 05/31/18 12:37 PM  Result Value Ref Range Status   Specimen Description BLOOD RIGHT ANTECUBITAL  Final   Special Requests   Final    BOTTLES DRAWN AEROBIC AND ANAEROBIC Blood Culture adequate volume   Culture   Final    NO GROWTH < 24 HOURS Performed at [facility], [address] [place], [place] [address]    Report Status PENDING  Incomplete  Blood Culture (routine x 2)     Status: None (Preliminary result)   Collection Time: 05/31/18 12:38 PM  Result Value Ref Range Status   Specimen Description BLOOD BLOOD RIGHT FOREARM  Final   Special Requests   Final    BOTTLES DRAWN AEROBIC AND ANAEROBIC Blood Culture results may not be optimal due to an inadequate volume of blood received in culture bottles   Culture   Final    NO GROWTH < 24 HOURS Performed at [facility], [address] [place], [place] [address]    Report Status PENDING  Incomplete  SARS Coronavirus 2 (CEPHEID- Performed in [facility] hospital lab), Hosp Order     Status: None   Collection Time: 05/31/18 12:38 PM  Result Value Ref Range Status   SARS Coronavirus 2 NEGATIVE NEGATIVE Final    Comment: (NOTE) If result is NEGATIVE SARS-CoV-2 target nucleic  acids are NOT DETECTED. The SARS-CoV-2 RNA is generally detectable in upper and lower  respiratory specimens during the acute phase of infection. The lowest  concentration of SARS-CoV-2 viral copies this assay can detect is 250  copies / mL. A negative result does not preclude SARS-CoV-2 infection  and should not be used as the sole basis for treatment or other  patient management decisions.  A negative result may occur with  improper specimen collection / handling, submission of specimen other  than nasopharyngeal swab, presence of viral mutation(s) within the  areas targeted by this assay, and inadequate number of viral copies  (<250 copies / mL). A negative result must be combined with clinical  observations, patient history, and epidemiological information. If result is POSITIVE SARS-CoV-2 target nucleic acids are DETECTED. The SARS-CoV-2 RNA is generally detectable in upper and lower  respiratory specimens dur ing the acute phase of infection.  Positive  results are indicative of active infection with SARS-CoV-2.  Clinical  correlation with patient history and other diagnostic information is  necessary to determine patient infection status.  Positive results do  not rule out bacterial infection or co-infection with other viruses. If result is PRESUMPTIVE POSTIVE SARS-CoV-2 nucleic acids MAY BE PRESENT.   A presumptive positive result was obtained on the submitted specimen  and confirmed on repeat testing.  While 2019 novel coronavirus  (SARS-CoV-2) nucleic acids may be present in the submitted sample  additional confirmatory testing may be necessary for epidemiological  and / or clinical management purposes  to differentiate between  SARS-CoV-2 and other Sarbecovirus currently known to infect humans.  If clinically indicated additional testing with an alternate test  methodology [phone]([id number]) is advised. The SARS-CoV-2 RNA is generally  detectable in upper and lower respiratory  sp ecimens during the acute  phase of infection. The expected result is Negative. Fact Sheet for Patients:  BoilerBrush.com.cyhttps://www.fda.gov/media/136312/download Fact Sheet for Healthcare Providers: https://pope.com/https://www.fda.gov/media/136313/download This test is not yet approved or cleared by the [place] FDA and has been authorized for detection and/or diagnosis of SARS-CoV-2 by FDA under an Emergency Use Authorization (EUA).  This EUA will remain in effect (meaning this test can be used) for the duration of the COVID-19 declaration under Section 564(b)(1) of the Act, 21 U.S.C. section 360bbb-3(b)(1), unless the authorization is terminated or revoked sooner. Performed at [facility], [address] [place], [place] [address]     Coagulation Studies: No results for input(s): LABPROT, INR in the last 72 hours.  Urinalysis:  Recent Labs  Lab 05/31/18 1237  COLORURINE YELLOW*  LABSPEC 1.015  PHURINE 6.0  GLUCOSEU NEGATIVE  HGBUR NEGATIVE  BILIRUBINUR NEGATIVE  KETONESUR 5*  PROTEINUR NEGATIVE  NITRITE NEGATIVE  LEUKOCYTESUR NEGATIVE    Lipid Panel: No results found for: CHOL, TRIG, HDL, CHOLHDL, VLDL, LDLCALC  HgbA1C: No results found for: HGBA1C  Urine Drug Screen:      Component Value Date/Time   LABOPIA NONE DETECTED 05/31/2018 1320   COCAINSCRNUR NONE DETECTED 05/31/2018 1320   LABBENZ NONE DETECTED 05/31/2018 1320   AMPHETMU NONE DETECTED 05/31/2018 1320   THCU NONE DETECTED 05/31/2018 1320   LABBARB NONE DETECTED 05/31/2018 1320    Alcohol Level:  Recent Labs  Lab 05/31/18 1425  ETH <10    Imaging: Ct Head Wo Contrast  Result Date: 05/31/2018 CLINICAL DATA:  53 year old male with dizziness EXAM: CT HEAD WITHOUT CONTRAST TECHNIQUE: Contiguous axial images were obtained from the base of the skull through the vertex without intravenous contrast. COMPARISON:  None. FINDINGS: Brain: No acute intracranial hemorrhage. No midline shift or mass effect. Gray-white  differentiation maintained. Minimal periventricular hypodensity of the right parietal region. Unremarkable appearance of the ventricular system. Vascular: Intracranial atherosclerosis Skull: No acute fracture.  No aggressive bone lesion identified. Sinuses/Orbits: Unremarkable appearance of the orbits. Mastoid air cells clear. No middle ear effusion. No significant sinus disease. Other: None IMPRESSION: Negative for acute intracranial abnormality Electronically Signed   By: Gilmer Mor D.O.   On: 05/31/2018 12:45   Mr Brain Wo Contrast  Result Date: 05/31/2018 CLINICAL DATA:  Generalized weakness and dizziness with imbalance. EXAM: MRI HEAD WITHOUT CONTRAST TECHNIQUE: Multiplanar, multiecho pulse sequences of the brain and surrounding structures were obtained without intravenous contrast. COMPARISON:  Head CT 05/31/2018 FINDINGS: Brain: There is an approximately 5 mm acute infarct laterally in the medulla on the right. No intracranial hemorrhage, mass, midline shift, or extra-axial fluid collection is identified. 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 Scattered small foci of T2 hyperintensity in the cerebral white matter bilaterally are nonspecific but compatible with mild chronic small vessel ischemic disease. A small chronic infarct is noted in the inferior aspect of the right cerebellum. There is mild cerebral atrophy. Vascular: Major intracranial vascular flow voids are preserved. Skull and upper cervical spine: Unremarkable bone marrow signal. Sinuses/Orbits: Unremarkable orbits. Chronic sinusitis involving a small, nearly completely opacified left maxillary sinus. Clear mastoid air cells. Other: None. IMPRESSION: 1. 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 2. Mild chronic small vessel ischemic disease. 3. Small chronic right cerebellar infarct. Electronically Signed   By: Sebastian Ache M.D.   On: 05/31/2018 21:49   Dg Chest Port 1 View  Result Date: 05/31/2018 CLINICAL DATA:  Nonproductive cough. EXAM:  PORTABLE CHEST 1 VIEW COMPARISON:  None. FINDINGS: The heart size and mediastinal contours are within normal limits. Both lungs are clear. No pneumothorax or pleural effusion is noted. The visualized skeletal structures are unremarkable. IMPRESSION: No active disease. Electronically Signed   By: Lupita Raider M.D.   On: 05/31/2018 12:42    Assessment: 53 y.o. male with a history of HTN and tobacco abuse who presents with new onset dizziness and gait instability.  BP elevated on presentation.  Patient not compliant with medications.  MRI of the brain reviewed and shows an acute right lateral medullary infarct.  Etiology likely small vessel disease.  Patient on no antiplatelet therapy prior to admission.  Stroke Risk Factors - hypertension and smoking  Plan: 1. HgbA1c, fasting  lipid panel 2. Smoking cessation counseling 3. PT consult, OT consult, Speech consult 4. Echocardiogram pending 5. Carotid dopplers pending 6. Prophylactic therapy-ASA  amd Plavix  for one month with placement on monotherapy of ASA  at that time to be continued chronically.   7. NPO until RN stroke swallow screen 8. Telemetry monitoring 9. Frequent neuro checks   Thana Farr, MD Neurology [phone] 06/01/2018, 10:33 AM

## 2018-06-01 NOTE — Evaluation (Signed)
Physical Therapy Evaluation Patient Details Name: Gerald Martin MRN: [id number] DOB: 10-10-1965 Today's Date: 06/01/2018   History of Present Illness  Pt is a 53 y.o. male presenting to hospital 05/31/18 with cough, dizziness, and generalized weakness.  (-) for COVID and flu.  Pt admitted with vertigo, leftward horizontal nystagmus, bradycardia, hypothermia, and htn.  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  PMH includes htn, GERD, depression.  Pt reports being on disability d/t back issues.  Clinical Impression  Prior to hospital admission, pt reports being independent and not working (on disability d/t back issues).  Pt lives alone in the "Loft" in [place] (handicap accessible/set-up).  Currently pt is SBA with bed mobility (for safety d/t impulsiveness); min assist for transfers (d/t R lean and unsteadiness); and min to mod assist to ambulate 40 feet with RW (pt with increased difficulty advancing R LE; decreased R LE foot clearance and heelstrike; and intermittent R lean/loss of balance requiring mod assist to maintain upright).  Mild R LE weakness noted with mild decreased R LE coordination deficits.  Pt able to state deficits but decreased insight into implications of deficits and decreased safety awareness noted throughout session causing safety concerns (even with attempted education).  Pt would benefit from skilled PT to address noted impairments and functional limitations (see below for any additional details).  Upon hospital discharge, recommend pt discharge to STR.  Currently d/t high fall risk, if pt chooses to discharge home (instead of rehab) pt would require manual w/c for safety with functional mobility.    Follow Up Recommendations SNF    Equipment Recommendations  Rolling walker with 5" wheels;3in1 (PT);Wheelchair (measurements PT);Wheelchair cushion (measurements PT)    Recommendations for Other Services OT consult      Precautions / Restrictions Precautions Precautions: Fall Restrictions Weight Bearing Restrictions: No      Mobility  Bed Mobility Overal bed mobility: Needs Assistance Bed Mobility: Supine to Sit;Sit to Supine     Supine to sit: Supervision;HOB elevated Sit to supine: Supervision;HOB elevated   General bed mobility comments: SBA for safety d/t impulsivity  Transfers Overall transfer level: Needs assistance Equipment used: Rolling walker (2 wheeled) Transfers: Sit to/from [employer] Sit to Stand: Min assist Stand pivot transfers: Min assist       General transfer comment: assist for balance standing (d/t mild R lean); assist for balance taking steps bed to recliner (pt holding onto furniture to maintain balance)  Ambulation/Gait Ambulation/Gait assistance: Min assist;Mod assist Gait Distance (Feet): 40 Feet Assistive device: Rolling walker (2 wheeled)   Gait velocity: decreased   General Gait Details: pt with increased difficulty advancing R LE; decreased R LE foot clearance and heelstrike; intermittent R lean/loss of balance requiring mod assist to maintain upright; vc's to stay closer to RW and for safety  Stairs            Wheelchair Mobility    Modified Rankin (Stroke Patients Only)       Balance Overall balance assessment: Needs assistance Sitting-balance support: No upper extremity supported;Feet supported Sitting balance-[name]: Fair Sitting balance - Comments: R lean (mild) noted in sitting (SBA for safety)   Standing balance support: No upper extremity supported Standing balance-[name]: Poor Standing balance comment: pt requiring at least single UE support for standing balance d/t R lean  Pertinent Vitals/Pain Pain Assessment: No/denies pain  HR 51 to 69 bpm during session's activities.    Home Living Family/patient expects to be discharged to:: Private residence Living  Arrangements: Alone   Type of Home: Apartment([place] in [place] (handicap accessible)) Home Access: Level entry     Home Layout: One level Home Equipment: None      Prior Function Level of Independence: Independent         Comments: Pt reports he used to work in [employer] but currently on disability d/t back issues.  Pt reports 2 recent falls d/t dizziness.     Hand Dominance        Extremity/Trunk Assessment   Upper Extremity Assessment Upper Extremity Assessment: Defer to OT evaluation    Lower Extremity Assessment Lower Extremity Assessment: RLE deficits/detail;LLE deficits/detail(intact B LE sensation, tone, and proprioception) RLE Deficits / Details: hip flexion 4/5; knee flexion 4/5; knee extension 4+/5; DF 4+/5; decreased R LE heel to shin quality LLE Deficits / Details: 5/5 hip flexion, knee flexion/extension and DF    Cervical / Trunk Assessment Cervical / Trunk Assessment: Normal  Communication   Communication: (Monotone)  Cognition Arousal/Alertness: Awake/alert Behavior During Therapy: Flat affect;Impulsive Overall Cognitive Status: No family/caregiver present to determine baseline cognitive functioning Area of Impairment: Following commands;Safety/judgement;Awareness;Problem solving                       Following Commands: Follows multi-step commands inconsistently Safety/Judgement: Decreased awareness of safety;Decreased awareness of deficits Awareness: Anticipatory Problem Solving: Slow processing;Requires tactile cues;Requires verbal cues General Comments: Oriented to person, place, time, and situation.      General Comments   Nursing cleared pt for participation in physical therapy.  Pt agreeable to PT session.    Exercises  Transfer and gait training   Assessment/Plan    PT Assessment Patient needs continued PT services  PT Problem List Decreased strength;Decreased balance;Decreased mobility;Decreased coordination;Decreased  cognition;Decreased knowledge of use of DME;Decreased safety awareness;Decreased knowledge of precautions       PT Treatment Interventions DME instruction;Gait training;Stair training;Functional mobility training;Therapeutic activities;Therapeutic exercise;Balance training;Neuromuscular re-education;Patient/family education    PT Goals (Current goals can be found in the Care Plan section)  Acute Rehab PT Goals Patient Stated Goal: to go home PT Goal Formulation: With patient Time For Goal Achievement: 06/15/18 Potential to Achieve Goals: Fair    Frequency 7X/week   Barriers to discharge Decreased caregiver support      Co-evaluation               AM-PAC PT "6 Clicks" Mobility  Outcome Measure Help needed turning from your back to your side while in a flat bed without using bedrails?: A Little Help needed moving from lying on your back to sitting on the side of a flat bed without using bedrails?: A Little Help needed moving to and from a bed to a chair (including a wheelchair)?: A Little Help needed standing up from a chair using your arms (e.g., wheelchair or bedside chair)?: A Little Help needed to walk in hospital room?: A Lot Help needed climbing 3-5 steps with a railing? 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

## 2018-06-01 NOTE — Plan of Care (Signed)
  Problem: Clinical Measurements: Goal: Ability to maintain clinical measurements within normal limits will improve Outcome: Progressing   Problem: Safety: Goal: Ability to remain free from injury will improve Outcome: Progressing   

## 2018-06-01 NOTE — Progress Notes (Signed)
Patient doesn't feel like his muscles are weak. Feels his equilibrium is off.  He is dizzy and feels off balance.

## 2018-06-01 NOTE — Progress Notes (Signed)
SLP Cancellation Note  Patient Details Name: Gerald Martin MRN: [id number] DOB: [birth year]   Cancelled treatment:       Reason Eval/Treat Not Completed: SLP screened, no needs identified, will sign off The patient was observed finishing breakfast with no overt indicators of dysphagia.  The patient presents with no communication deficits as well- patient's speech is intelligible and he is able to participate in conversation with no overt word finding difficulty.  Dollene Primrose, MS/CCC- SLP  Leandrew Koyanagi 06/01/2018, 9:41 AM

## 2018-06-02 ENCOUNTER — Inpatient Hospital Stay (HOSPITAL_COMMUNITY)
Admit: 2018-06-02 | Discharge: 2018-06-02 | Disposition: A | Discharge status: Home / Self Care | Payer: Medicare Other | Attending: Family Medicine | Admitting: Family Medicine

## 2018-06-02 DIAGNOSIS — I371 Nonrheumatic pulmonary valve insufficiency: Secondary | ICD-10-CM

## 2018-06-02 DIAGNOSIS — I340 Nonrheumatic mitral (valve) insufficiency: Secondary | ICD-10-CM

## 2018-06-02 DIAGNOSIS — R42 Dizziness and giddiness: Secondary | ICD-10-CM

## 2018-06-02 LAB — HIV ANTIBODY (ROUTINE TESTING W REFLEX): HIV Screen 4th Generation wRfx: NONREACTIVE

## 2018-06-02 NOTE — Evaluation (Signed)
Occupational Therapy Evaluation Patient Details Name: Gerald Martin MRN: [id number] DOB: 06/13/1965 Today's Date: 06/02/2018    History of Present Illness Pt is a 53 y.o. male presenting to hospital 05/31/18 with cough, dizziness, and generalized weakness.  (-) for COVID and flu.  Pt admitted with vertigo, leftward horizontal nystagmus, bradycardia, hypothermia, and htn.  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  PMH includes htn, GERD, depression.  Pt reports being on disability d/t back issues.   Clinical Impression   Pt seen for OT Evaluation this date. Pt was independent prior to admission, living by himself, and with multiple falls 2/2 dizziness more recently. Pt currently presents with impaired balance (R lat lean), dizziness, decreased awareness and problem solving skills, mild impairment in R side strength compared to L side, and requiring increased assist for functional mobility and ADL tasks for safety. Pt educated in falls prevention. safety, CVA education, and functional mobility/DME for ADL. Pt had decreased recall of why he had dizziness during session requiring cues for recall. Pt mildly impulsive to move. Pt will benefit from skilled OT services to maximize return to PLOF and minimize risk of falls and need for increased caregiver burden. Recommend STR initially 2/2 decreased support and impaired safety. Will continue to assess for discharge recs as pt progresses.  Patient suffers from CVA with significant dizziness which impairs his ability to perform daily activities including toileting, dressing, bathing, and all aspects of functional mobility in the home. A walker will not resolve the patient's issue with performing activities of daily living. A wheelchair is required/recommended and will allow patient to safely perform daily activities. Patient can safely propel the wheelchair in the home or has a caregiver who can provide assistance.       Follow Up Recommendations  SNF    Equipment Recommendations  3 in 1 bedside commode;Wheelchair (measurements OT);Wheelchair cushion (measurements OT)    Recommendations for Other Services       Precautions / Restrictions Precautions Precautions: Fall Precaution Comments: dizziness Restrictions Weight Bearing Restrictions: No      Mobility Bed Mobility Overal bed mobility: Needs Assistance Bed Mobility: Supine to Sit     Supine to sit: Supervision;HOB elevated     General bed mobility comments: SBA for safety d/t impulsivity  Transfers Overall transfer level: Needs assistance Equipment used: Rolling walker (2 wheeled) Transfers: Sit to/from Stand Sit to Stand: Min assist         General transfer comment: assist for balance standing (d/t mild R lean); assist for balance taking steps bed to recliner (pt trying to push RW to the side requiring cues to keep RW wth him until he was fully seated)    Balance Overall balance assessment: Needs assistance Sitting-balance support: No upper extremity supported;Feet supported Sitting balance-[name]: Fair Sitting balance - Comments: intermittent R lean (mild) noted in sitting (SBA for safety)   Standing balance support: Bilateral upper extremity supported Standing balance-[name]: Poor Standing balance comment: pt requiring at least single UE support for standing balance d/t R lean                           ADL either performed or assessed with clinical judgement   ADL Overall ADL's : Needs assistance/impaired Eating/Feeding: Sitting;Set up   Grooming: Sitting;Set up   Upper Body Bathing: Sitting;Set up;Supervision/ safety   Lower Body Bathing: Sit to/from stand;Minimal assistance   Upper Body  Dressing : Sitting;Supervision/safety;Set up   Lower Body Dressing: Sit to/from stand;Minimal assistance   Toilet Transfer: BSC;RW;Ambulation;Min guard;Minimal assistance                    Vision Baseline Vision/History: No visual deficits Patient Visual Report: No change from baseline Vision Assessment?: No apparent visual deficits     Perception     Praxis      Pertinent Vitals/Pain Pain Assessment: Faces Faces Pain Scale: Hurts a little bit Pain Location: low back pain (chronic) Pain Descriptors / Indicators: Grimacing Pain Intervention(s): Limited activity within patient's tolerance;Monitored during session;Repositioned     Hand Dominance Right   Extremity/Trunk Assessment Upper Extremity Assessment Upper Extremity Assessment: RUE deficits/detail;LUE deficits/detail RUE Deficits / Details: 4+/5, , pt notes "tingling" in R hand RUE Sensation: WNL RUE Coordination: WNL LUE Deficits / Details: WFL LUE Coordination: WNL   Lower Extremity Assessment Lower Extremity Assessment: Defer to PT evaluation;RLE deficits/detail;LLE deficits/detail RLE Deficits / Details: hip flexion 4/5; knee flexion 4/5; knee extension 4+/5; DF 4+/5; decreased R LE heel to shin quality, pt notes "tingling" in R foot RLE Coordination: WNL LLE Deficits / Details: 5/5 hip flexion, knee flexion/extension and DF LLE Sensation: WNL LLE Coordination: WNL   Cervical / Trunk Assessment Cervical / Trunk Assessment: Normal   Communication Communication Communication: Other (comment)(Monotone)   Cognition Arousal/Alertness: Awake/alert Behavior During Therapy: Flat affect;Impulsive Overall Cognitive Status: No family/caregiver present to determine baseline cognitive functioning Area of Impairment: Following commands;Safety/judgement;Awareness;Problem solving                       Following Commands: Follows multi-step commands inconsistently Safety/Judgement: Decreased awareness of safety;Decreased awareness of deficits Awareness: Anticipatory Problem Solving: Slow processing;Requires verbal cues General Comments: oriented to person, place, time, situation, follows  commands, a little impulsive, requires cues to recall of his diagnosis, decreased safety awareness/awareness of deficits and problem solving sklls   General Comments       Exercises Other Exercises Other Exercises: pt educated in falls prevention. safety, CVA education, and functional mobility/DME for ADL   Shoulder Instructions      Home Living Family/patient expects to be discharged to:: Private residence Living Arrangements: Alone   Type of Home: Apartment[place] [place] (handicap accessible[place] Home Access: Level entry     Home Layout: One level         Bathroom Toilet: Handicapped height     Home Equipment: None          Prior Functioning/Environment Level of Independence: Independent        Comments: Pt reports he used to work in [employer] but currently on disability d/t back issues.  Pt reports 2 recent falls d/t dizziness. Limited help available.        OT Problem List: Decreased strength;Decreased knowledge of use of DME or AE;Decreased cognition;Decreased safety awareness;Impaired sensation;Pain;Impaired balance (sitting and/or standing)      OT Treatment/Interventions: Self-care/ADL training;Balance training;Therapeutic exercise;Therapeutic activities;Neuromuscular education;DME and/or AE instruction;Cognitive remediation/compensation;Patient/family education    OT Goals(Current goals can be found in the care plan section) Acute Rehab OT Goals Patient Stated Goal: to go home OT Goal Formulation: With patient Time For Goal Achievement: 06/16/18 Potential to Achieve Goals: Good ADL Goals Pt Will Perform Lower Body Dressing: with min guard assist;sit to/from stand Pt Will Transfer to Toilet: with supervision;ambulating(comfort height toilet, LRAD for amb)  OT Frequency: Min 2X/week   Barriers to D/C: Decreased caregiver support  Co-evaluation              AM-PAC OT "6 Clicks" Daily Activity     Outcome Measure Help from  another person eating meals?: None Help from another person taking care of personal grooming?: A Little Help from another person toileting, which includes using toliet, bedpan, or urinal?: A Little Help from another person bathing (including washing, rinsing, drying)?: A Little Help from another person to put on and taking off regular upper body clothing?: A Little Help from another person to put on and taking off regular lower body clothing?: A Little 6 Click Score: 19   End of Session Equipment Utilized During Treatment: Gait belt;Rolling walker  Activity Tolerance: Patient tolerated treatment well Patient left: in chair;with call bell/phone within reach;with chair alarm set  OT Visit Diagnosis: Other abnormalities of gait and mobility (R26.89);Hemiplegia and hemiparesis;Dizziness and giddiness (R42) Hemiplegia - Right/Left: Right Hemiplegia - dominant/non-dominant: Dominant Hemiplegia - caused by: Cerebral infarction                Time: 6045-4098 OT Time Calculation (min): 31 min Charges:  OT General Charges $OT Visit: 1 Visit OT Evaluation $OT Eval Moderate Complexity: 1 Mod OT Treatments $Self Care/Home Management : 8-22 mins  Richrd Prime, MPH, MS, OTR/L ascom [phone] 06/02/18, 10:20 AM

## 2018-06-02 NOTE — Progress Notes (Addendum)
[facility] [place] [facility]   PATIENT NAME: Gerald Martin    MR#:  [id number]  DATE OF BIRTH:  [birth year]  SUBJECTIVE:  Patient is feeling better today, continues to complain of intermittent dizziness/headache, case discussed with neurology, PT/OT recommending SNF  REVIEW OF SYSTEMS:  CONSTITUTIONAL: No fever, fatigue or weakness.  EYES: No blurred or double vision.  EARS, NOSE, AND THROAT: No tinnitus or ear pain.  RESPIRATORY: No cough, shortness of breath, wheezing or hemoptysis.  CARDIOVASCULAR: No chest pain, orthopnea, edema.  GASTROINTESTINAL: No nausea, vomiting, diarrhea or abdominal pain.  GENITOURINARY: No dysuria, hematuria.  ENDOCRINE: No polyuria, nocturia,  HEMATOLOGY: No anemia, easy bruising or bleeding SKIN: No rash or lesion. MUSCULOSKELETAL: No joint pain or arthritis.   NEUROLOGIC: No tingling, numbness, weakness.  PSYCHIATRY: No anxiety or depression.   ROS  DRUG ALLERGIES:  Not on File  VITALS:  Blood pressure (!) 146/96, pulse (!) 56, temperature 97.8 F (36.6 C), temperature source Oral, resp. rate 19, height  (1.93 m), weight 74.8 kg, SpO2 96 %.  PHYSICAL EXAMINATION:  GENERAL:  53 y.o.-year-old patient lying in the bed with no acute distress.  EYES: Pupils equal, round, reactive to light and accommodation. No scleral icterus. Extraocular muscles intact.  HEENT: Head atraumatic, normocephalic. Oropharynx and nasopharynx clear.  NECK:  Supple, no jugular venous distention. No thyroid enlargement, no tenderness.  LUNGS: Normal breath sounds bilaterally, no wheezing, rales,rhonchi or crepitation. No use of accessory muscles of respiration.  CARDIOVASCULAR: S1, S2 normal. No murmurs, rubs, or gallops.  ABDOMEN: Soft, nontender, nondistended. Bowel sounds present. No organomegaly or mass.  EXTREMITIES: No pedal edema, cyanosis, or clubbing.  NEUROLOGIC: Cranial nerves II through XII are intact. Muscle strength 5/5 in all  extremities. Sensation intact. Gait not checked.  PSYCHIATRIC: The patient is alert and oriented x 3.  SKIN: No obvious rash, lesion, or ulcer.   Physical Exam LABORATORY PANEL:   CBC Recent Labs  Lab 06/01/18 0052  WBC 7.8  HGB 14.7  HCT 43.5  PLT 250   ------------------------------------------------------------------------------------------------------------------  Chemistries  Recent Labs  Lab 05/31/18 1158 06/01/18 0052  NA 140 137  K 3.9 4.1  CL 106 105  CO2 25 25  GLUCOSE 116* 175*  BUN 12 14  CREATININE 0.71 0.72  CALCIUM 9.1 8.3*  AST 23  --   ALT 35  --   ALKPHOS 64  --   BILITOT 0.8  --    ------------------------------------------------------------------------------------------------------------------  Cardiac Enzymes Recent Labs  Lab 06/01/18 0052 06/01/18 0648  TROPONINI <0.03 <0.03   ------------------------------------------------------------------------------------------------------------------  RADIOLOGY:  Ct Head Wo Contrast  Result Date: 05/31/2018 CLINICAL DATA:  53 year old male with dizziness EXAM: CT HEAD WITHOUT CONTRAST TECHNIQUE: Contiguous axial images were obtained from the base of the skull through the vertex without intravenous contrast. COMPARISON:  None. FINDINGS: Brain: No acute intracranial hemorrhage. No midline shift or mass effect. Gray-white differentiation maintained. Minimal periventricular hypodensity of the right parietal region. Unremarkable appearance of the ventricular system. Vascular: Intracranial atherosclerosis Skull: No acute fracture.  No aggressive bone lesion identified. Sinuses/Orbits: Unremarkable appearance of the orbits. Mastoid air cells clear. No middle ear effusion. No significant sinus disease. Other: None IMPRESSION: Negative for acute intracranial abnormality Electronically Signed   By: Gilmer Mor D.O.   On: 05/31/2018 12:45   Mr Maxine Glenn Head Wo Contrast  Result Date: 06/01/2018 CLINICAL DATA:   Dizziness and ataxia. 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 EXAM: MRA NECK WITHOUT AND WITH  CONTRAST MRA HEAD WITHOUT CONTRAST TECHNIQUE: Multiplanar and multiecho pulse sequences of the neck were obtained without and with intravenous contrast. Angiographic images of the neck were obtained using MRA technique without and with intravenous contast.; Angiographic images of the Circle of Willis were obtained using MRA technique without intravenous contrast. CONTRAST:  7 mL Gadavist COMPARISON:  None. FINDINGS: MRA NECK FINDINGS There is a normal variant aortic arch branching pattern with common origin of brachiocephalic and left common carotid arteries. The brachiocephalic and subclavian arteries are widely patent. The cervical carotid arteries are patent and smooth bilaterally without evidence of stenosis or dissection. The vertebral arteries are patent and codominant with antegrade flow bilaterally. Mild motion artifact is noted through the vertebral artery origins, predominantly on the right which limits assessment for an underlying stenosis on the right. Elsewhere, the vertebral arteries are smooth without evidence of significant stenosis or dissection. MRA HEAD FINDINGS The study is moderately motion degraded despite repeat imaging. The intracranial vertebral arteries are widely patent to the basilar. The left PICA is patent. Right PICA is not well seen. Right AICA appears dominant. SCAs are not well evaluated due to motion artifact. The basilar artery is patent with motion artifact through its mid and distal portions but no stenosis on the contrast-enhanced neck MRA. There is a large left posterior communicating artery with a suspected smaller posterior communicating artery on the right. Both PCAs are patent without definite flow limiting proximal stenosis within limitations of motion artifact. The internal carotid arteries are patent from skull base to carotid termini with assessment limited by motion though without  evidence of significant stenosis through the proximal supraclinoid segments on the contrast-enhanced neck MRA. ACAs and MCAs are grossly patent with motion artifact precluding assessment for stenosis. No aneurysm is identified. IMPRESSION: 1. Negative neck MRA aside from suboptimal assessment of the right vertebral artery origin due to mild motion artifact. 2. Motion degraded head MRA. No evidence of large vessel occlusion. No significant vertebrobasilar or intracranial ICA stenosis on neck MRA. Limited assessment for ACA, MCA, and PCA stenosis. Electronically Signed   By: Sebastian Ache M.D.   On: 06/01/2018 11:54   Mr Maxine Glenn Neck W Wo Contrast  Result Date: 06/01/2018 CLINICAL DATA:  Dizziness and ataxia. 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 EXAM: MRA NECK WITHOUT AND WITH CONTRAST MRA HEAD WITHOUT CONTRAST TECHNIQUE: Multiplanar and multiecho pulse sequences of the neck were obtained without and with intravenous contrast. Angiographic images of the neck were obtained using MRA technique without and with intravenous contast.; Angiographic images of the Circle of Willis were obtained using MRA technique without intravenous contrast. CONTRAST:  7 mL Gadavist COMPARISON:  None. FINDINGS: MRA NECK FINDINGS There is a normal variant aortic arch branching pattern with common origin of brachiocephalic and left common carotid arteries. The brachiocephalic and subclavian arteries are widely patent. The cervical carotid arteries are patent and smooth bilaterally without evidence of stenosis or dissection. The vertebral arteries are patent and codominant with antegrade flow bilaterally. Mild motion artifact is noted through the vertebral artery origins, predominantly on the right which limits assessment for an underlying stenosis on the right. Elsewhere, the vertebral arteries are smooth without evidence of significant stenosis or dissection. MRA HEAD FINDINGS The study is moderately motion degraded despite repeat imaging. The  intracranial vertebral arteries are widely patent to the basilar. The left PICA is patent. Right PICA is not well seen. Right AICA appears dominant. SCAs are not well evaluated due to motion artifact. The basilar artery  is patent with motion artifact through its mid and distal portions but no stenosis on the contrast-enhanced neck MRA. There is a large left posterior communicating artery with a suspected smaller posterior communicating artery on the right. Both PCAs are patent without definite flow limiting proximal stenosis within limitations of motion artifact. The internal carotid arteries are patent from skull base to carotid termini with assessment limited by motion though without evidence of significant stenosis through the proximal supraclinoid segments on the contrast-enhanced neck MRA. ACAs and MCAs are grossly patent with motion artifact precluding assessment for stenosis. No aneurysm is identified. IMPRESSION: 1. Negative neck MRA aside from suboptimal assessment of the right vertebral artery origin due to mild motion artifact. 2. Motion degraded head MRA. No evidence of large vessel occlusion. No significant vertebrobasilar or intracranial ICA stenosis on neck MRA. Limited assessment for ACA, MCA, and PCA stenosis. Electronically Signed   By: Sebastian Ache M.D.   On: 06/01/2018 11:54   Mr Brain Wo Contrast  Result Date: 05/31/2018 CLINICAL DATA:  Generalized weakness and dizziness with imbalance. EXAM: MRI HEAD WITHOUT CONTRAST TECHNIQUE: Multiplanar, multiecho pulse sequences of the brain and surrounding structures were obtained without intravenous contrast. COMPARISON:  Head CT 05/31/2018 FINDINGS: Brain: There is an approximately 5 mm acute infarct laterally in the medulla on the right. No intracranial hemorrhage, mass, midline shift, or extra-axial fluid collection is identified. 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 Scattered small foci of T2 hyperintensity in the cerebral white matter  bilaterally are nonspecific but compatible with mild chronic small vessel ischemic disease. A small chronic infarct is noted in the inferior aspect of the right cerebellum. There is mild cerebral atrophy. Vascular: Major intracranial vascular flow voids are preserved. Skull and upper cervical spine: Unremarkable bone marrow signal. Sinuses/Orbits: Unremarkable orbits. Chronic sinusitis involving a small, nearly completely opacified left maxillary sinus. Clear mastoid air cells. Other: None. IMPRESSION: 1. 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 2. Mild chronic small vessel ischemic disease. 3. Small chronic right cerebellar infarct. Electronically Signed   By: Sebastian Ache M.D.   On: 05/31/2018 21:49   US Carotid Bilateral  Result Date: 06/01/2018 CLINICAL DATA:  53 year old male with CVA EXAM: BILATERAL CAROTID DUPLEX ULTRASOUND TECHNIQUE: Wallace Cullens scale imaging, color Doppler and duplex ultrasound were performed of bilateral carotid and vertebral arteries in the neck. COMPARISON:  No prior duplex FINDINGS: Criteria: Quantification of carotid stenosis is based on velocity parameters that correlate the residual internal carotid diameter with NASCET-based stenosis levels, using the diameter of the distal internal carotid lumen as the denominator for stenosis measurement. The following velocity measurements were obtained: RIGHT ICA:  Systolic 45 cm/sec, Diastolic 17 cm/sec CCA:  68 cm/sec SYSTOLIC ICA/CCA RATIO:  0.7 ECA:  86 cm/sec LEFT ICA:  Systolic 62 cm/sec, Diastolic 19 cm/sec CCA:  46 cm/sec SYSTOLIC ICA/CCA RATIO:  1.4 ECA:  63 cm/sec Right Brachial SBP: Not acquired Left Brachial SBP: Not acquired RIGHT CAROTID ARTERY: No significant calcified disease of the right common carotid artery. Intermediate waveform maintained. Heterogeneous plaque without significant calcifications at the right carotid bifurcation. Low resistance waveform of the right ICA. No significant tortuosity. RIGHT VERTEBRAL ARTERY: Antegrade flow with  low resistance waveform. LEFT CAROTID ARTERY: No significant calcified disease of the left common carotid artery. Intermediate waveform maintained. Heterogeneous plaque at the left carotid bifurcation without significant calcifications. Low resistance waveform of the left ICA. LEFT VERTEBRAL ARTERY:  Antegrade flow with low resistance waveform. IMPRESSION: Color duplex indicates minimal heterogeneous  plaque, with no hemodynamically significant stenosis by duplex criteria in the extracranial cerebrovascular circulation. Signed, Yvone NeuJaime S. Reyne DumasWagner, DO, RPVI [facility] Electronically Signed   By: Gilmer MorJaime  Wagner D.O.   On: 06/01/2018 12:02   Dg Chest Port 1 View  Result Date: 05/31/2018 CLINICAL DATA:  Nonproductive cough. EXAM: PORTABLE CHEST 1 VIEW COMPARISON:  None. FINDINGS: The heart size and mediastinal contours are within normal limits. Both lungs are clear. No pneumothorax or pleural effusion is noted. The visualized skeletal structures are unremarkable. IMPRESSION: No active disease. Electronically Signed   By: Lupita RaiderJames  Green Jr M.D.   On: 05/31/2018 12:42   [place] Thyroid  Result Date: 06/01/2018 CLINICAL DATA:  Hyperthyroidism EXAM: THYROID ULTRASOUND TECHNIQUE: Ultrasound examination of the thyroid gland and adjacent soft tissues was performed. COMPARISON:  None. FINDINGS: Parenchymal Echotexture: Mildly heterogenous Isthmus: 3 mm Right lobe: 5.1 x 2.2 x 1.2 cm Left lobe: 4.1 x 2.1 x 1.8 cm _________________________________________________________ Estimated total number of nodules >/= 1 cm: 1 Number of spongiform nodules >/=  2 cm not described below (TR1): 0 Number of mixed cystic and solid nodules >/= 1.5 cm not described below (TR2): 0 _________________________________________________________ Nodule # 1: Location: Right; Inferior Maximum size: 1.5 cm; Other 2 dimensions: 1.3 x 1.0 cm Composition: mixed cystic and solid (1) Echogenicity: isoechoic (1)  Shape: taller-than-wide (3) Margins: ill-defined (0) Echogenic foci: none (0) ACR TI-RADS total points: 4. ACR TI-RADS risk category: TR4 (4-6 points). ACR TI-RADS recommendations: **Given size (>/= 1.5 cm) and appearance, fine needle aspiration of this moderately suspicious nodule should be considered based on TI-RADS criteria. _________________________________________________________ No significant left thyroid abnormality.  No regional adenopathy. IMPRESSION: 1.5 cm right inferior TR 4 nodule meets criteria for biopsy as above. The above is in keeping with the ACR TI-RADS recommendations - J Am Coll Radiol 2017;14:587-595. Electronically Signed   By: Judie PetitM.  Shick M.D.   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  Continue CVA protocol, neurology input appreciated-continue aspirin/Plavix, statin therapy, follow-up on echocardiogram, carotid Dopplers negative for any hemodynamically significant stenosis, MRI of the brain noted, MRA was a sub-optimal study-negative for any gross stenosis, no needs identified by speech therapy, PT/OT recommending SNF, continue aspiration/fall precautions while in house, neurochecks per routine, meclizine as needed dizziness/vertigo  *Acute bradycardia  Resolved  Most likely secondary to Coreg which has been given bradycardia TSH 0.3 -work-up per below   *Acute hyperthyroidism  TSH 0.3, T4 normal, follow-up on free T3 level Thyroid ultrasound noted for thyroid nodule-we will need FNA/biopsy for further work-up, consult IR for biopsy  *Acute hypothermia Resolved No evidence of sepsis Influenza negative  *Chronic benign essential hypertension  Stable on current regiment  Coreg held given bradycardia   *COPD w/o exacerbation Continue home inhalers PRN  *Major depressive disorder Stable continue Prozac and Seroquel  Disposition to  skilled nursing facility in 1 to 2 days barring any complications   All the records are reviewed and case discussed with Care Management/Social Workerr. Management plans discussed with the patient, family and they are in agreement.  CODE STATUS: full  TOTAL TIME TAKING CARE OF THIS PATIENT: 35 minutes.   POSSIBLE D/C IN 1-2 DAYS, DEPENDING ON CLINICAL CONDITION.   Evelena AsaMontell D Salary M.D on 06/02/2018   Between 7am to 6pm - Pager - [phone]  After 6pm go to [email or web address] - password [email or web address]  [facility]  [facility]  Office  [phone]  CC: Primary care physician; Lyndon Code, MD  Note: This dictation was prepared with Dragon dictation along with smaller phrase technology. Any transcriptional errors that result from this process are unintentional.

## 2018-06-02 NOTE — Progress Notes (Addendum)
Subjective: Patient reports some dizziness this morning but has otherwise no neurological changes.    Objective: Current vital signs: BP (!) 146/96 (BP Location: Right Arm)   Pulse (!) 56   Temp 97.8 F (36.6 C) (Oral)   Resp 19   Ht 6\' 4"  (1.93 m)   Wt 74.8 kg   SpO2 96%   BMI 20.07 kg/m  Vital signs in last 24 hours: Temp:  [97.8 F (36.6 C)-97.9 F (36.6 C)] 97.8 F (36.6 C) (05/10 0745) Pulse Rate:  [55-69] 56 (05/10 0745) Resp:  [18-19] 19 (05/10 0745) BP: (124-161)/(78-107) 146/96 (05/10 0745) SpO2:  [96 %-100 %] 96 % (05/10 0745)  Intake/Output from previous day: 05/09 0701 - 05/10 0700 In: -  Out: 1425 [Urine:1425] Intake/Output this shift: Total I/O In: -  Out: 750 [Urine:750] Nutritional status:  Diet Order            Diet Heart Room service appropriate? Yes; Fluid consistency: Thin  Diet effective now              Neurologic Exam: Mental Status: Alert, oriented, thought content appropriate.  Speech fluent without evidence of aphasia.  Able to follow 3 step commands without difficulty. Cranial Nerves: II: Visual fields grossly normal III,IV, VI: ptosis not present, extra-ocular motions intact bilaterally V,VII: smile symmetric, facial light touch sensation normal bilaterally VIII: hearing normal bilaterally IX,X: gag reflex present XI: bilateral shoulder shrug XII: midline tongue extension Motor: 5/5 throughout Sensory: Pinprick and light touch intact throughout, bilaterally  Lab Results: Basic Metabolic Panel: Recent Labs  Lab 05/31/18 1158 06/01/18 0052  NA 140 137  K 3.9 4.1  CL 106 105  CO2 25 25  GLUCOSE 116* 175*  BUN 12 14  CREATININE 0.71 0.72  CALCIUM 9.1 8.3*    Liver Function Tests: Recent Labs  Lab 05/31/18 1158  AST 23  ALT 35  ALKPHOS 64  BILITOT 0.8  PROT 7.0  ALBUMIN 3.6   No results for input(s): LIPASE, AMYLASE in the last 168 hours. No results for input(s): AMMONIA in the last 168 hours.  CBC: Recent  Labs  Lab 05/31/18 1158 06/01/18 0052  WBC 10.6*  10.9* 7.8  NEUTROABS 9.3*  --   HGB 15.6  15.5 14.7  HCT 46.1  45.9 43.5  MCV 93.1  92.7 92.2  PLT 272  261 250    Cardiac Enzymes: Recent Labs  Lab 05/31/18 1425 05/31/18 1840 06/01/18 0052 06/01/18 0648  TROPONINI <0.03 <0.03 <0.03 <0.03    Lipid Panel: Recent Labs  Lab 06/01/18 0052  CHOL 144  TRIG 39  HDL 45  CHOLHDL 3.2  VLDL 8  LDLCALC 91    CBG: No results for input(s): GLUCAP in the last 168 hours.  Microbiology: Results for orders placed or performed during the hospital encounter of 05/31/18  Blood Culture (routine x 2)     Status: None (Preliminary result)   Collection Time: 05/31/18 12:37 PM  Result Value Ref Range Status   Specimen Description BLOOD RIGHT ANTECUBITAL  Final   Special Requests   Final    BOTTLES DRAWN AEROBIC AND ANAEROBIC Blood Culture adequate volume   Culture   Final    NO GROWTH 2 DAYS Performed at [facility], [address] [place], [place] [id number]    Report Status PENDING  Incomplete  Blood Culture (routine x 2)     Status: None (Preliminary result)   Collection Time: 05/31/18 12:38 PM  Result Value Ref Range  Status   Specimen Description BLOOD BLOOD RIGHT FOREARM  Final   Special Requests   Final    BOTTLES DRAWN AEROBIC AND ANAEROBIC Blood Culture results may not be optimal due to an inadequate volume of blood received in culture bottles   Culture   Final    NO GROWTH 2 DAYS Performed at [facility], [address], [place], [place] [address]    Report Status PENDING  Incomplete  SARS Coronavirus 2 (CEPHEID- Performed in [facility] hospital lab), Hosp Order     Status: None   Collection Time: 05/31/18 12:38 PM  Result Value Ref Range Status   SARS Coronavirus 2 NEGATIVE NEGATIVE Final    Comment: (NOTE) If result is NEGATIVE SARS-CoV-2 target nucleic acids are NOT DETECTED. The SARS-CoV-2 RNA is generally detectable in upper and  lower  respiratory specimens during the acute phase of infection. The lowest  concentration of SARS-CoV-2 viral copies this assay can detect is 250  copies / mL. A negative result does not preclude SARS-CoV-2 infection  and should not be used as the sole basis for treatment or other  patient management decisions.  A negative result may occur with  improper specimen collection / handling, submission of specimen other  than nasopharyngeal swab, presence of viral mutation(s) within the  areas targeted by this assay, and inadequate number of viral copies  (<250 copies / mL). A negative result must be combined with clinical  observations, patient history, and epidemiological information. If result is POSITIVE SARS-CoV-2 target nucleic acids are DETECTED. The SARS-CoV-2 RNA is generally detectable in upper and lower  respiratory specimens dur ing the acute phase of infection.  Positive  results are indicative of active infection with SARS-CoV-2.  Clinical  correlation with patient history and other diagnostic information is  necessary to determine patient infection status.  Positive results do  not rule out bacterial infection or co-infection with other viruses. If result is PRESUMPTIVE POSTIVE SARS-CoV-2 nucleic acids MAY BE PRESENT.   A presumptive positive result was obtained on the submitted specimen  and confirmed on repeat testing.  While 2019 novel coronavirus  (SARS-CoV-2) nucleic acids may be present in the submitted sample  additional confirmatory testing may be necessary for epidemiological  and / or clinical management purposes  to differentiate between  SARS-CoV-2 and other Sarbecovirus currently known to infect humans.  If clinically indicated additional testing with an alternate test  methodology [phone]) is advised. The SARS-CoV-2 RNA is generally  detectable in upper and lower respiratory sp ecimens during the acute  phase of infection. The expected result is  Negative. Fact Sheet for Patients:  BoilerBrush.com.cy Fact Sheet for Healthcare Providers: https://pope.com/ This test is not yet approved or cleared by the [place] FDA and has been authorized for detection and/or diagnosis of SARS-CoV-2 by FDA under an Emergency Use Authorization (EUA).  This EUA will remain in effect (meaning this test can be used) for the duration of the COVID-19 declaration under Section 564(b)(1) of the Act, 21 U.S.C. section 360bbb-3(b)(1), unless the authorization is terminated or revoked sooner. Performed at [facility], [address] [place], [place] [address]   Urine culture     Status: None   Collection Time: 05/31/18  1:20 PM  Result Value Ref Range Status   Specimen Description   Final    URINE, RANDOM Performed at [facility], [address], [place], [place] [address]    Special Requests   Final  NONE Performed at [facility], [address], [place], [place] [address]    Culture   Final    NO GROWTH Performed at [facility], [address], [place], [place] [address]    Report Status 06/01/2018 FINAL  Final    Coagulation Studies: No results for input(s): LABPROT, INR in the last 72 hours.  Imaging: Mr Shirlee Latch UJ Contrast  Result Date: 06/01/2018 CLINICAL DATA:  Dizziness and ataxia. 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 EXAM: MRA NECK WITHOUT AND WITH CONTRAST MRA HEAD WITHOUT CONTRAST TECHNIQUE: Multiplanar and multiecho pulse sequences of the neck were obtained without and with intravenous contrast. Angiographic images of the neck were obtained using MRA technique without and with intravenous contast.; Angiographic images of the Circle of Willis were obtained using MRA technique without intravenous contrast. CONTRAST:  7 mL Gadavist COMPARISON:  None. FINDINGS: MRA NECK FINDINGS There is a normal variant aortic arch branching pattern with common origin  of brachiocephalic and left common carotid arteries. The brachiocephalic and subclavian arteries are widely patent. The cervical carotid arteries are patent and smooth bilaterally without evidence of stenosis or dissection. The vertebral arteries are patent and codominant with antegrade flow bilaterally. Mild motion artifact is noted through the vertebral artery origins, predominantly on the right which limits assessment for an underlying stenosis on the right. Elsewhere, the vertebral arteries are smooth without evidence of significant stenosis or dissection. MRA HEAD FINDINGS The study is moderately motion degraded despite repeat imaging. The intracranial vertebral arteries are widely patent to the basilar. The left PICA is patent. Right PICA is not well seen. Right AICA appears dominant. SCAs are not well evaluated due to motion artifact. The basilar artery is patent with motion artifact through its mid and distal portions but no stenosis on the contrast-enhanced neck MRA. There is a large left posterior communicating artery with a suspected smaller posterior communicating artery on the right. Both PCAs are patent without definite flow limiting proximal stenosis within limitations of motion artifact. The internal carotid arteries are patent from skull base to carotid termini with assessment limited by motion though without evidence of significant stenosis through the proximal supraclinoid segments on the contrast-enhanced neck MRA. ACAs and MCAs are grossly patent with motion artifact precluding assessment for stenosis. No aneurysm is identified. IMPRESSION: 1. Negative neck MRA aside from suboptimal assessment of the right vertebral artery origin due to mild motion artifact. 2. Motion degraded head MRA. No evidence of large vessel occlusion. No significant vertebrobasilar or intracranial ICA stenosis on neck MRA. Limited assessment for ACA, MCA, and PCA stenosis. Electronically Signed   By: Sebastian Ache M.D.    On: 06/01/2018 11:54   Mr Maxine Glenn Neck W Wo Contrast  Result Date: 06/01/2018 CLINICAL DATA:  Dizziness and ataxia. 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 EXAM: MRA NECK WITHOUT AND WITH CONTRAST MRA HEAD WITHOUT CONTRAST TECHNIQUE: Multiplanar and multiecho pulse sequences of the neck were obtained without and with intravenous contrast. Angiographic images of the neck were obtained using MRA technique without and with intravenous contast.; Angiographic images of the Circle of Willis were obtained using MRA technique without intravenous contrast. CONTRAST:  7 mL Gadavist COMPARISON:  None. FINDINGS: MRA NECK FINDINGS There is a normal variant aortic arch branching pattern with common origin of brachiocephalic and left common carotid arteries. The brachiocephalic and subclavian arteries are widely patent. The cervical carotid arteries are patent and smooth bilaterally without evidence of stenosis or dissection. The vertebral arteries are patent and  codominant with antegrade flow bilaterally. Mild motion artifact is noted through the vertebral artery origins, predominantly on the right which limits assessment for an underlying stenosis on the right. Elsewhere, the vertebral arteries are smooth without evidence of significant stenosis or dissection. MRA HEAD FINDINGS The study is moderately motion degraded despite repeat imaging. The intracranial vertebral arteries are widely patent to the basilar. The left PICA is patent. Right PICA is not well seen. Right AICA appears dominant. SCAs are not well evaluated due to motion artifact. The basilar artery is patent with motion artifact through its mid and distal portions but no stenosis on the contrast-enhanced neck MRA. There is a large left posterior communicating artery with a suspected smaller posterior communicating artery on the right. Both PCAs are patent without definite flow limiting proximal stenosis within limitations of motion artifact. The internal carotid arteries are  patent from skull base to carotid termini with assessment limited by motion though without evidence of significant stenosis through the proximal supraclinoid segments on the contrast-enhanced neck MRA. ACAs and MCAs are grossly patent with motion artifact precluding assessment for stenosis. No aneurysm is identified. IMPRESSION: 1. Negative neck MRA aside from suboptimal assessment of the right vertebral artery origin due to mild motion artifact. 2. Motion degraded head MRA. No evidence of large vessel occlusion. No significant vertebrobasilar or intracranial ICA stenosis on neck MRA. Limited assessment for ACA, MCA, and PCA stenosis. Electronically Signed   By: Sebastian Ache M.D.   On: 06/01/2018 11:54   Mr Brain Wo Contrast  Result Date: 05/31/2018 CLINICAL DATA:  Generalized weakness and dizziness with imbalance. EXAM: MRI HEAD WITHOUT CONTRAST TECHNIQUE: Multiplanar, multiecho pulse sequences of the brain and surrounding structures were obtained without intravenous contrast. COMPARISON:  Head CT 05/31/2018 FINDINGS: Brain: There is an approximately 5 mm acute infarct laterally in the medulla on the right. No intracranial hemorrhage, mass, midline shift, or extra-axial fluid collection is identified. 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 Scattered small foci of T2 hyperintensity in the cerebral white matter bilaterally are nonspecific but compatible with mild chronic small vessel ischemic disease. A small chronic infarct is noted in the inferior aspect of the right cerebellum. There is mild cerebral atrophy. Vascular: Major intracranial vascular flow voids are preserved. Skull and upper cervical spine: Unremarkable bone marrow signal. Sinuses/Orbits: Unremarkable orbits. Chronic sinusitis involving a small, nearly completely opacified left maxillary sinus. Clear mastoid air cells. Other: None. IMPRESSION: 1. 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 2. Mild chronic small vessel ischemic disease. 3. Small  chronic right cerebellar infarct. Electronically Signed   By: Sebastian Ache M.D.   On: 05/31/2018 21:49   US Carotid Bilateral  Result Date: 06/01/2018 CLINICAL DATA:  53 year old male with CVA EXAM: BILATERAL CAROTID DUPLEX ULTRASOUND TECHNIQUE: Wallace Cullens scale imaging, color Doppler and duplex ultrasound were performed of bilateral carotid and vertebral arteries in the neck. COMPARISON:  No prior duplex FINDINGS: Criteria: Quantification of carotid stenosis is based on velocity parameters that correlate the residual internal carotid diameter with NASCET-based stenosis levels, using the diameter of the distal internal carotid lumen as the denominator for stenosis measurement. The following velocity measurements were obtained: RIGHT ICA:  Systolic 45 cm/sec, Diastolic 17 cm/sec CCA:  68 cm/sec SYSTOLIC ICA/CCA RATIO:  0.7 ECA:  86 cm/sec LEFT ICA:  Systolic 62 cm/sec, Diastolic 19 cm/sec CCA:  46 cm/sec SYSTOLIC ICA/CCA RATIO:  1.4 ECA:  63 cm/sec Right Brachial SBP: Not acquired Left Brachial SBP: Not acquired RIGHT CAROTID ARTERY:  No significant calcified disease of the right common carotid artery. Intermediate waveform maintained. Heterogeneous plaque without significant calcifications at the right carotid bifurcation. Low resistance waveform of the right ICA. No significant tortuosity. RIGHT VERTEBRAL ARTERY: Antegrade flow with low resistance waveform. LEFT CAROTID ARTERY: No significant calcified disease of the left common carotid artery. Intermediate waveform maintained. Heterogeneous plaque at the left carotid bifurcation without significant calcifications. Low resistance waveform of the left ICA. LEFT VERTEBRAL ARTERY:  Antegrade flow with low resistance waveform. IMPRESSION: Color duplex indicates minimal heterogeneous plaque, with no hemodynamically significant stenosis by duplex criteria in the extracranial cerebrovascular circulation. Signed, Yvone Neu. Reyne Dumas, RPVI [facility] Radiology Electronically Signed   By: Gilmer Mor D.O.   On: 06/01/2018 12:02   US Thyroid  Result Date: 06/01/2018 CLINICAL DATA:  Hyperthyroidism EXAM: THYROID ULTRASOUND TECHNIQUE: Ultrasound examination of the thyroid gland and adjacent soft tissues was performed. COMPARISON:  None. FINDINGS: Parenchymal Echotexture: Mildly heterogenous Isthmus: 3 mm Right lobe: 5.1 x 2.2 x 1.2 cm Left lobe: 4.1 x 2.1 x 1.8 cm _________________________________________________________ Estimated total number of nodules >/= 1 cm: 1 Number of spongiform nodules >/=  2 cm not described below (TR1): 0 Number of mixed cystic and solid nodules >/= 1.5 cm not described below (TR2): 0 _________________________________________________________ Nodule # 1: Location: Right; Inferior Maximum size: 1.5 cm; Other 2 dimensions: 1.3 x 1.0 cm Composition: mixed cystic and solid (1) Echogenicity: isoechoic (1) Shape: taller-than-wide (3) Margins: ill-defined (0) Echogenic foci: none (0) ACR TI-RADS total points: 4. ACR TI-RADS risk category: TR4 (4-6 points). ACR TI-RADS recommendations: **Given size (>/= 1.5 cm) and appearance, fine needle aspiration of this moderately suspicious nodule should be considered based on TI-RADS criteria. _________________________________________________________ No significant left thyroid abnormality.  No regional adenopathy. IMPRESSION: 1.5 cm right inferior TR 4 nodule meets criteria for biopsy as above. The above is in keeping with the ACR TI-RADS recommendations - J Am Coll Radiol 2017;14:587-595. Electronically Signed   By: Judie Petit.  Shick M.D.   On: 06/01/2018 13:41    Medications:  I have reviewed the patient's current medications. Scheduled: . amLODipine  10 mg Oral Daily  . aspirin EC  81 mg Oral Daily  . atorvastatin  40 mg Oral q1800  . clopidogrel  75 mg Oral Daily  . enoxaparin (LOVENOX) injection  40 mg Subcutaneous Q24H  . famotidine  20 mg Oral BID  . FLUoxetine  40 mg  Oral Daily  . Ipratropium-Albuterol  1 puff Inhalation QID  . lisinopril  40 mg Oral Daily  . sodium chloride flush  3 mL Intravenous Q12H  . tiotropium  18 mcg Inhalation Daily  . traZODone  100 mg Oral QHS    Assessment/Plan: Patient with lateral medullary infarct.  Dizziness remains an issue at times.  Otherwise stable.  On ASA, Plavix and statin.  Carotid dopplers show no evidence of hemodynamically significant stenosis.  Echocardiogram pending.  A1c 5.6, LDL 91.    Recommendations: 1. Echocardiogram pending 2. Continue ASA, Plavix and Statin 3. Antivert prn for dizziness 4. Patient to follow up with neurology on an outpatient basis.    No further neurologic intervention is recommended at this time.  If further questions arise, please call or page at that time.  Thank you for allowing neurology to participate in the care of this patient.  Thana Farr, MD Neurology [phone] 06/02/2018  1:10 PM   LOS: 2 days

## 2018-06-02 NOTE — Progress Notes (Signed)
Physical Therapy Treatment Patient Details Name: Gerald Martin MRN: [id number] DOB: 06/11/1965 Today's Date: 06/02/2018    History of Present Illness Pt is a 53 y.o. male presenting to hospital 05/31/18 with cough, dizziness, and generalized weakness.  (-) for COVID and flu.  Pt admitted with vertigo, leftward horizontal nystagmus, bradycardia, hypothermia, and htn.  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  PMH includes htn, GERD, depression.  Pt reports being on disability d/t back issues.    PT Comments    Patient alert and willing to work with physical therapy this afternoon. He reports improved dizziness.  NT in room taking vitals and agreed to be +2 for W/C follow for safety. Patient required min A for STS transfers this session using RW and continues to leave walker behind and use objects in room for support, inappropriately splitting support between RW and other objects. He is able to provide rationale for this choice to clinician and does not agree it is unsafe. Patient increased ambulation distance to 300 feet with RW and min A +2 for chair follow and to prevent fall with intermittent LOB, swaying, and staggering. Patient demo decreased R foot clearance and heel strike during ambulation that improved with cuing. SpO2 and HR remained WFL throughout session. Patient would benefit from continued physical therapy to address remaining impairments and functional limitations to work towards stated goals and return to PLOF or maximal functional independence.    Follow Up Recommendations  SNF     Equipment Recommendations  Rolling walker with 5" wheels;3in1 (PT);Wheelchair (measurements PT);Wheelchair cushion (measurements PT)    Recommendations for Other Services OT consult     Precautions / Restrictions Precautions Precautions: Fall Precaution Comments: dizziness Restrictions Weight Bearing Restrictions: No    Mobility  Bed  Mobility               General bed mobility comments: already seated at start of visit; vitals being taken by NT  Transfers Overall transfer level: Needs assistance Equipment used: Rolling walker (2 wheeled) Transfers: Sit to/from Stand Sit to Stand: Min assist         General transfer comment: assist for balance standing (d/t mild R lean); patient continues to push RW to the side and use window ceil for support with one UE requiring cues to keep RW wth him until he was fully seated.. Able to argue back why he felt it was safe and that RW was not tipping due to lack of putting weight througho it.   Ambulation/Gait Ambulation/Gait assistance: Min assist;+2 safety/equipment Gait Distance (Feet): 300 Feet Assistive device: Rolling walker (2 wheeled) Gait Pattern/deviations: Drifts right/left;Staggering right;Staggering left;Decreased dorsiflexion - right Gait velocity: decreased   General Gait Details: patient ambulated around nursing station with RW and min A +2 for chair follow. He occassionally staggered more R than left and required min A to prevent fall. He demo decreased R LE foot clearance and heel strike that improved with cuing. Improved abilty to advance R LE. O2 sat and HR WFL throughout session.    Stairs             Wheelchair Mobility    Modified Rankin (Stroke Patients Only)       Balance Overall balance assessment: Needs assistance Sitting-balance support: Feet supported;Bilateral upper extremity supported Sitting balance-[name]: Good Sitting balance - Comments: able to sit at edge of bed steady   Standing balance support: Bilateral upper extremity supported;During functional activity Standing balance-Leahy  Scale: Poor Standing balance comment: pt required min A during standing and ambulation due to lean and unsteadiness, mostly to R                            Cognition Arousal/Alertness: Awake/alert Behavior During Therapy: Flat  affect;Impulsive Overall Cognitive Status: No family/caregiver present to determine baseline cognitive functioning Area of Impairment: Following commands;Safety/judgement;Awareness;Problem solving                       Following Commands: Follows multi-step commands inconsistently Safety/Judgement: Decreased awareness of safety;Decreased awareness of deficits Awareness: Anticipatory Problem Solving: Slow processing;Requires verbal cues General Comments: follows commands, a little impulsive, seems to demo improved safety awareness/awareness of deficits and problem solving skills overall but continues to have deficits in this area.       Exercises Other Exercises Other Exercises: pt educated in falls prevention. safety, and functional mobility/DME for mobilty.  Other Exercises: completed 30 second balance with self-selected stance, 15 second balance with narrow stance with min A as neccessary and touchdown UE support to improve balance.     General Comments        Pertinent Vitals/Pain Pain Assessment: Faces Pain Score: 0-No pain    Home Living                      Prior Function            PT Goals (current goals can now be found in the care plan section) Acute Rehab PT Goals Patient Stated Goal: to go home PT Goal Formulation: With patient Time For Goal Achievement: 06/15/18 Potential to Achieve Goals: Fair Progress towards PT goals: Progressing toward goals    Frequency    7X/week      PT Plan Current plan remains appropriate    Co-evaluation              AM-PAC PT "6 Clicks" Mobility   Outcome Measure  Help needed turning from your back to your side while in a flat bed without using bedrails?: A Little Help needed moving from lying on your back to sitting on the side of a flat bed without using bedrails?: A Little Help needed moving to and from a bed to a chair (including a wheelchair)?: A Little Help needed standing up from a chair  using your arms (e.g., wheelchair or bedside chair)?: A Little Help needed to walk in hospital room?: A Little Help needed climbing 3-5 steps with a railing? 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 Ilsa Iha, PT, DPT 06/02/18, 4:05 PM

## 2018-06-03 LAB — T3, FREE: T3, Free: 1.9 pg/mL — ABNORMAL LOW (ref 2.0–4.4)

## 2018-06-03 LAB — ECHOCARDIOGRAM COMPLETE
Height: 76 in
Weight: 2638.4 oz

## 2018-06-03 MED ORDER — ATORVASTATIN CALCIUM 40 MG PO TABS: 40.0000 mg | ORAL_TABLET | Freq: Every day | ORAL | 0 refills | Status: DC

## 2018-06-03 MED ORDER — ASPIRIN 81 MG PO TBEC: 81.0000 mg | DELAYED_RELEASE_TABLET | Freq: Every day | ORAL | 0 refills | Status: DC

## 2018-06-03 MED ORDER — CLOPIDOGREL BISULFATE 75 MG PO TABS: 75.0000 mg | ORAL_TABLET | Freq: Every day | ORAL | 0 refills | Status: DC

## 2018-06-03 MED ORDER — MECLIZINE HCL 25 MG PO TABS: 25.0000 mg | ORAL_TABLET | Freq: Three times a day (TID) | ORAL | 0 refills | Status: DC | PRN

## 2018-06-03 NOTE — Progress Notes (Signed)
Subjective: Dizziness has improved and able to ambulate    Objective: Current vital signs: BP (!) 163/96 (BP Location: Right Arm)   Pulse 60   Temp 98.7 F (37.1 C) (Oral)   Resp 18   Ht  (1.93 m)   Wt 74.2 kg   SpO2 97%   BMI 19.91 kg/m  Vital signs in last 24 hours: Temp:  [98.1 F (36.7 C)-98.7 F (37.1 C)] 98.7 F (37.1 C) (05/11 0727) Pulse Rate:  [60-74] 60 (05/11 0727) Resp:  [18-19] 18 (05/11 0439) BP: (138-163)/(85-96) 163/96 (05/11 0727) SpO2:  [97 %-100 %] 97 % (05/11 0727) Weight:  [74.2 kg] 74.2 kg (05/11 0439)  Intake/Output from previous day: 05/10 0701 - 05/11 0700 In: 480 [P.O.:480] Out: 4800 [Urine:4800] Intake/Output this shift: Total I/O In: 3 [I.V.:3] Out: 1250 [Urine:1250] Nutritional status:  Diet Order            Diet Heart Room service appropriate? Yes; Fluid consistency: Thin  Diet effective now              Neurologic Exam: Mental Status: Alert, oriented, thought content appropriate.  Speech fluent without evidence of aphasia.  Able to follow 3 step commands without difficulty. Cranial Nerves: II: Visual fields grossly normal III,IV, VI: ptosis not present, extra-ocular motions intact bilaterally V,VII: smile symmetric, facial light touch sensation normal bilaterally VIII: hearing normal bilaterally IX,X: gag reflex present XI: bilateral shoulder shrug XII: midline tongue extension Motor: 5/5 throughout Sensory: Pinprick and light touch intact throughout, bilaterally  Lab Results: Basic Metabolic Panel: Recent Labs  Lab 05/31/18 1158 06/01/18 0052  NA 140 137  K 3.9 4.1  CL 106 105  CO2 25 25  GLUCOSE 116* 175*  BUN 12 14  CREATININE 0.71 0.72  CALCIUM 9.1 8.3*    Liver Function Tests: Recent Labs  Lab 05/31/18 1158  AST 23  ALT 35  ALKPHOS 64  BILITOT 0.8  PROT 7.0  ALBUMIN 3.6   No results for input(s): LIPASE, AMYLASE in the last 168 hours. No results for input(s): AMMONIA in the last 168  hours.  CBC: Recent Labs  Lab 05/31/18 1158 06/01/18 0052  WBC 10.6*  10.9* 7.8  NEUTROABS 9.3*  --   HGB 15.6  15.5 14.7  HCT 46.1  45.9 43.5  MCV 93.1  92.7 92.2  PLT 272  261 250    Cardiac Enzymes: Recent Labs  Lab 05/31/18 1425 05/31/18 1840 06/01/18 0052 06/01/18 0648  TROPONINI <0.03 <0.03 <0.03 <0.03    Lipid Panel: Recent Labs  Lab 06/01/18 0052  CHOL 144  TRIG 39  HDL 45  CHOLHDL 3.2  VLDL 8  LDLCALC 91    CBG: No results for input(s): GLUCAP in the last 168 hours.  Microbiology: Results for orders placed or performed during the hospital encounter of 05/31/18  Blood Culture (routine x 2)     Status: None (Preliminary result)   Collection Time: 05/31/18 12:37 PM  Result Value Ref Range Status   Specimen Description BLOOD RIGHT ANTECUBITAL  Final   Special Requests   Final    BOTTLES DRAWN AEROBIC AND ANAEROBIC Blood Culture adequate volume   Culture   Final    NO GROWTH 3 DAYS Performed at [facility], [address] [place], [place] [address]    Report Status PENDING  Incomplete  Blood Culture (routine x 2)     Status: None (Preliminary result)   Collection Time: 05/31/18 12:38 PM  Result Value  Ref Range Status   Specimen Description BLOOD BLOOD RIGHT FOREARM  Final   Special Requests   Final    BOTTLES DRAWN AEROBIC AND ANAEROBIC Blood Culture results may not be optimal due to an inadequate volume of blood received in culture bottles   Culture   Final    NO GROWTH 3 DAYS Performed at [facility], [address] [place], [place] [address]    Report Status PENDING  Incomplete  SARS Coronavirus 2 (CEPHEID- Performed in [facility] hospital lab), Hosp Order     Status: None   Collection Time: 05/31/18 12:38 PM  Result Value Ref Range Status   SARS Coronavirus 2 NEGATIVE NEGATIVE Final    Comment: (NOTE) If result is NEGATIVE SARS-CoV-2 target nucleic acids are NOT DETECTED. The SARS-CoV-2 RNA is generally  detectable in upper and lower  respiratory specimens during the acute phase of infection. The lowest  concentration of SARS-CoV-2 viral copies this assay can detect is 250  copies / mL. A negative result does not preclude SARS-CoV-2 infection  and should not be used as the sole basis for treatment or other  patient management decisions.  A negative result may occur with  improper specimen collection / handling, submission of specimen other  than nasopharyngeal swab, presence of viral mutation(s) within the  areas targeted by this assay, and inadequate number of viral copies  (<250 copies / mL). A negative result must be combined with clinical  observations, patient history, and epidemiological information. If result is POSITIVE SARS-CoV-2 target nucleic acids are DETECTED. The SARS-CoV-2 RNA is generally detectable in upper and lower  respiratory specimens dur ing the acute phase of infection.  Positive  results are indicative of active infection with SARS-CoV-2.  Clinical  correlation with patient history and other diagnostic information is  necessary to determine patient infection status.  Positive results do  not rule out bacterial infection or co-infection with other viruses. If result is PRESUMPTIVE POSTIVE SARS-CoV-2 nucleic acids MAY BE PRESENT.   A presumptive positive result was obtained on the submitted specimen  and confirmed on repeat testing.  While 2019 novel coronavirus  (SARS-CoV-2) nucleic acids may be present in the submitted sample  additional confirmatory testing may be necessary for epidemiological  and / or clinical management purposes  to differentiate between  SARS-CoV-2 and other Sarbecovirus currently known to infect humans.  If clinically indicated additional testing with an alternate test  methodology [phone]) is advised. The SARS-CoV-2 RNA is generally  detectable in upper and lower respiratory sp ecimens during the acute  phase of infection. The  expected result is Negative. Fact Sheet for Patients:  BoilerBrush.com.cy Fact Sheet for Healthcare Providers: https://pope.com/ This test is not yet approved or cleared by the [place] FDA and has been authorized for detection and/or diagnosis of SARS-CoV-2 by FDA under an Emergency Use Authorization (EUA).  This EUA will remain in effect (meaning this test can be used) for the duration of the COVID-19 declaration under Section 564(b)(1) of the Act, 21 U.S.C. section 360bbb-3(b)(1), unless the authorization is terminated or revoked sooner. Performed at [facility], [address] [place], [place] [address]   Urine culture     Status: None   Collection Time: 05/31/18  1:20 PM  Result Value Ref Range Status   Specimen Description   Final    URINE, RANDOM Performed at [facility], [address] [place], [place] [address]    Special Requests   Final  NONE Performed at [facility], [address] [place], [place] [address]    Culture   Final    NO GROWTH Performed at [facility], [address] [place], [place] [address]    Report Status 06/01/2018 FINAL  Final    Coagulation Studies: No results for input(s): LABPROT, INR in the last 72 hours.  Imaging: [place] Carotid Bilateral  Result Date: 06/01/2018 CLINICAL DATA:  53 year old male with CVA EXAM: BILATERAL CAROTID DUPLEX ULTRASOUND TECHNIQUE: Wallace CullensGray scale imaging, color Doppler and duplex ultrasound were performed of bilateral carotid and vertebral arteries in the neck. COMPARISON:  No prior duplex FINDINGS: Criteria: Quantification of carotid stenosis is based on velocity parameters that correlate the residual internal carotid diameter with NASCET-based stenosis levels, using the diameter of the distal internal carotid lumen as the denominator for stenosis measurement. The following velocity measurements were obtained: RIGHT ICA:   Systolic 45 cm/sec, Diastolic 17 cm/sec CCA:  68 cm/sec SYSTOLIC ICA/CCA RATIO:  0.7 ECA:  86 cm/sec LEFT ICA:  Systolic 62 cm/sec, Diastolic 19 cm/sec CCA:  46 cm/sec SYSTOLIC ICA/CCA RATIO:  1.4 ECA:  63 cm/sec Right Brachial SBP: Not acquired Left Brachial SBP: Not acquired RIGHT CAROTID ARTERY: No significant calcified disease of the right common carotid artery. Intermediate waveform maintained. Heterogeneous plaque without significant calcifications at the right carotid bifurcation. Low resistance waveform of the right ICA. No significant tortuosity. RIGHT VERTEBRAL ARTERY: Antegrade flow with low resistance waveform. LEFT CAROTID ARTERY: No significant calcified disease of the left common carotid artery. Intermediate waveform maintained. Heterogeneous plaque at the left carotid bifurcation without significant calcifications. Low resistance waveform of the left ICA. LEFT VERTEBRAL ARTERY:  Antegrade flow with low resistance waveform. IMPRESSION: Color duplex indicates minimal heterogeneous plaque, with no hemodynamically significant stenosis by duplex criteria in the extracranial cerebrovascular circulation. Signed, Yvone NeuJaime S. Reyne DumasWagner, DO, RPVI [facility] Electronically Signed   By: Gilmer MorJaime  Wagner D.O.   On: 06/01/2018 12:02   [place] Thyroid  Result Date: 06/01/2018 CLINICAL DATA:  Hyperthyroidism EXAM: THYROID ULTRASOUND TECHNIQUE: Ultrasound examination of the thyroid gland and adjacent soft tissues was performed. COMPARISON:  None. FINDINGS: Parenchymal Echotexture: Mildly heterogenous Isthmus: 3 mm Right lobe: 5.1 x 2.2 x 1.2 cm Left lobe: 4.1 x 2.1 x 1.8 cm _________________________________________________________ Estimated total number of nodules >/= 1 cm: 1 Number of spongiform nodules >/=  2 cm not described below (TR1): 0 Number of mixed cystic and solid nodules >/= 1.5 cm not described below (TR2): 0  _________________________________________________________ Nodule # 1: Location: Right; Inferior Maximum size: 1.5 cm; Other 2 dimensions: 1.3 x 1.0 cm Composition: mixed cystic and solid (1) Echogenicity: isoechoic (1) Shape: taller-than-wide (3) Margins: ill-defined (0) Echogenic foci: none (0) ACR TI-RADS total points: 4. ACR TI-RADS risk category: TR4 (4-6 points). ACR TI-RADS recommendations: **Given size (>/= 1.5 cm) and appearance, fine needle aspiration of this moderately suspicious nodule should be considered based on TI-RADS criteria. _________________________________________________________ No significant left thyroid abnormality.  No regional adenopathy. IMPRESSION: 1.5 cm right inferior TR 4 nodule meets criteria for biopsy as above. The above is in keeping with the ACR TI-RADS recommendations - J Am Coll Radiol 2017;14:587-595. Electronically Signed   By: Judie PetitM.  Shick M.D.   On: 06/01/2018 13:41    Medications:  I have reviewed the patient's current medications. Scheduled: . amLODipine  10 mg Oral Daily  . aspirin EC  81 mg Oral Daily  . atorvastatin  40 mg Oral  q1800  . clopidogrel  75 mg Oral Daily  . enoxaparin (LOVENOX) injection  40 mg Subcutaneous Q24H  . famotidine  20 mg Oral BID  . FLUoxetine  40 mg Oral Daily  . Ipratropium-Albuterol  1 puff Inhalation QID  . lisinopril  40 mg Oral Daily  . sodium chloride flush  3 mL Intravenous Q12H  . tiotropium  18 mcg Inhalation Daily  . traZODone  100 mg Oral QHS    Assessment/Plan: Patient with lateral medullary infarct.  Dizziness remains an issue at times.  Otherwise stable.  On ASA, Plavix and statin.  Carotid dopplers show no evidence of hemodynamically significant stenosis.     -  Continue ASA, Plavix and Statin -  Antivert prn for dizziness -  Patient to follow up with neurology on an outpatient basis.   - d/c planning today 06/03/2018  11:18 AM   LOS: 3 days

## 2018-06-03 NOTE — Discharge Summary (Signed)
[employer] [facility] [place] at [facility]   PATIENT NAME: Gerald Martin    MR#:  [id number]  DATE OF BIRTH:  Jun 05, 1965  DATE OF ADMISSION:  05/31/2018 ADMITTING PHYSICIAN: Campbell Stall, MD  DATE OF DISCHARGE: 06/03/2018  1:45 PM  PRIMARY CARE PHYSICIAN: Lyndon Code, MD    ADMISSION DIAGNOSIS:  Symptomatic bradycardia [R00.1] Hypothermia, initial encounter [T68.XXXA] Sepsis, due to unspecified organism, unspecified whether acute organ dysfunction present (HCC) [A41.9]  DISCHARGE DIAGNOSIS:  Acute medullary stroke  SECONDARY DIAGNOSIS:   Past Medical History:  Diagnosis Date  . Hypertension     HOSPITAL COURSE:   1.  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  Patient started on aspirin Plavix and statin.  LDL 91 and goal less than 70.  Physical therapy recommended rehab but patient wanted to go home.  Home health set up.  After home health finishes I did give him the number to the balance center where he can work on his balance. 2.  Bradycardia.  Coreg stopped. 3.  Thyroid nodule.  Recommended biopsy but since the patient was started on Plavix I do not want to stop the Plavix for 7 days right at this point with the acute stroke.  Will need outpatient biopsy of the thyroid nodule.  TSH slightly in the hyperthyroid range.  Would recheck in 6 weeks. 4.  Hypothermia on presentation.  No evidence of sepsis.  This has resolved 5.  Hypertension.  Blood pressure borderline on current regimen minus Coreg.  Continue to monitor as outpatient and better blood pressure control as outpatient. 6.  Major depression on Prozac and Seroquel 7.  COPD.  Continue inhalers 8.  Impaired fasting glucose.  Hemoglobin A1c 5.6.  Patient not a diabetic  DISCHARGE CONDITIONS:   Satisfactory  CONSULTS OBTAINED:  Treatment Team:  Kym Groom, MD  DRUG ALLERGIES:  Not on File  DISCHARGE MEDICATIONS:   Allergies as of 06/03/2018   Not on File     Medication List    STOP taking  these medications   carvedilol 12.5 MG tablet Commonly known as:  COREG     TAKE these medications   amLODipine 10 MG tablet Commonly known as:  NORVASC Take 10 mg by mouth daily.   aspirin 81 MG EC tablet Take 1 tablet (81 mg total) by mouth daily. Start taking on:  Jun 04, 2018   atorvastatin 40 MG tablet Commonly known as:  LIPITOR Take 1 tablet (40 mg total) by mouth daily at 6 PM.   clopidogrel 75 MG tablet Commonly known as:  PLAVIX Take 1 tablet (75 mg total) by mouth daily.   Combivent Respimat 20-100 MCG/ACT Aers respimat Generic drug:  Ipratropium-Albuterol Inhale 1 puff into the lungs 4 (four) times daily.   FLUoxetine 40 MG capsule Commonly known as:  PROZAC Take 40 mg by mouth daily.   lisinopril 40 MG tablet Commonly known as:  ZESTRIL Take 40 mg by mouth daily.   meclizine 25 MG tablet Commonly known as:  ANTIVERT Take 1 tablet (25 mg total) by mouth 3 (three) times daily as needed for dizziness.   ranitidine 150 MG tablet Commonly known as:  ZANTAC Take 150 mg by mouth 2 (two) times daily.   Spiriva HandiHaler 18 MCG inhalation capsule Generic drug:  tiotropium Place 1 puff into inhaler and inhale daily.   traZODone 100 MG tablet Commonly known as:  DESYREL Take 100 mg by mouth at bedtime.  DISCHARGE INSTRUCTIONS:   Follow-up PMD 5 days Home health set up  If you experience worsening of your admission symptoms, develop shortness of breath, life threatening emergency, suicidal or homicidal thoughts you must seek medical attention immediately by calling 911 or calling your MD immediately  if symptoms less severe.  You Must read complete instructions/literature along with all the possible adverse reactions/side effects for all the Medicines you take and that have been prescribed to you. Take any new Medicines after you have completely understood and accept all the possible adverse reactions/side effects.   Please note  You were cared  for by a hospitalist during your hospital stay. If you have any questions about your discharge medications or the care you received while you were in the hospital after you are discharged, you can call the unit and asked to speak with the hospitalist on call if the hospitalist that took care of you is not available. Once you are discharged, your primary care physician will handle any further medical issues. Please note that NO REFILLS for any discharge medications will be authorized once you are discharged, as it is imperative that you return to your primary care physician (or establish a relationship with a primary care physician if you do not have one) for your aftercare needs so that they can reassess your need for medications and monitor your lab values.    Today   CHIEF COMPLAINT:   Chief Complaint  Patient presents with  . Cough  . Dizziness    HISTORY OF PRESENT ILLNESS:  Gerald Martin  is a 53 y.o. male with a known history of hypertension presents with dizziness.   VITAL SIGNS:  Blood pressure (!) 163/96, pulse 60, temperature 98.7 F (37.1 C), temperature source Oral, resp. rate 18, height 6\' 4"  (1.93 m), weight 74.2 kg, SpO2 97 %.  Blood pressure variable during the hospital stay. PHYSICAL EXAMINATION:  GENERAL:  53 y.o.-year-old patient lying in the bed with no acute distress.  EYES: Pupils equal, round, reactive to light and accommodation. No scleral icterus. Extraocular muscles intact.  HEENT: Head atraumatic, normocephalic. Oropharynx and nasopharynx clear.  NECK:  Supple, no jugular venous distention. No thyroid enlargement, no tenderness.  LUNGS: Decreased breath sounds bilaterally, no wheezing, rales,rhonchi or crepitation. No use of accessory muscles of respiration.  CARDIOVASCULAR: S1, S2 normal. No murmurs, rubs, or gallops.  ABDOMEN: Soft, non-tender, non-distended. Bowel sounds present. No organomegaly or mass.  EXTREMITIES: No pedal edema, cyanosis, or clubbing.   NEUROLOGIC: Cranial nerves II through XII are intact. Muscle strength 5/5 in all extremities. Sensation intact. Gait not checked.  PSYCHIATRIC: The patient is alert and oriented x 3.  SKIN: No obvious rash, lesion, or ulcer.   DATA REVIEW:   CBC Recent Labs  Lab 06/01/18 0052  WBC 7.8  HGB 14.7  HCT 43.5  PLT 250    Chemistries  Recent Labs  Lab 05/31/18 1158 06/01/18 0052  NA 140 137  K 3.9 4.1  CL 106 105  CO2 25 25  GLUCOSE 116* 175*  BUN 12 14  CREATININE 0.71 0.72  CALCIUM 9.1 8.3*  AST 23  --   ALT 35  --   ALKPHOS 64  --   BILITOT 0.8  --     Cardiac Enzymes Recent Labs  Lab 06/01/18 0648  TROPONINI <0.03    Microbiology Results  Results for orders placed or performed during the hospital encounter of 05/31/18  Blood Culture (routine x 2)  Status: None (Preliminary result)   Collection Time: 05/31/18 12:37 PM  Result Value Ref Range Status   Specimen Description BLOOD RIGHT ANTECUBITAL  Final   Special Requests   Final    BOTTLES DRAWN AEROBIC AND ANAEROBIC Blood Culture adequate volume   Culture   Final    NO GROWTH 3 DAYS Performed at [facility], [address] [place], [place] [address]    Report Status PENDING  Incomplete  Blood Culture (routine x 2)     Status: None (Preliminary result)   Collection Time: 05/31/18 12:38 PM  Result Value Ref Range Status   Specimen Description BLOOD BLOOD RIGHT FOREARM  Final   Special Requests   Final    BOTTLES DRAWN AEROBIC AND ANAEROBIC Blood Culture results may not be optimal due to an inadequate volume of blood received in culture bottles   Culture   Final    NO GROWTH 3 DAYS Performed at [facility], [address], [place], [place] [address]    Report Status PENDING  Incomplete  SARS Coronavirus 2 (CEPHEID- Performed in [facility] hospital lab), Hosp Order     Status: None   Collection Time: 05/31/18 12:38 PM  Result Value Ref Range Status   SARS Coronavirus 2  NEGATIVE NEGATIVE Final    Comment: (NOTE) If result is NEGATIVE SARS-CoV-2 target nucleic acids are NOT DETECTED. The SARS-CoV-2 RNA is generally detectable in upper and lower  respiratory specimens during the acute phase of infection. The lowest  concentration of SARS-CoV-2 viral copies this assay can detect is 250  copies / mL. A negative result does not preclude SARS-CoV-2 infection  and should not be used as the sole basis for treatment or other  patient management decisions.  A negative result may occur with  improper specimen collection / handling, submission of specimen other  than nasopharyngeal swab, presence of viral mutation(s) within the  areas targeted by this assay, and inadequate number of viral copies  (<250 copies / mL). A negative result must be combined with clinical  observations, patient history, and epidemiological information. If result is POSITIVE SARS-CoV-2 target nucleic acids are DETECTED. The SARS-CoV-2 RNA is generally detectable in upper and lower  respiratory specimens dur ing the acute phase of infection.  Positive  results are indicative of active infection with SARS-CoV-2.  Clinical  correlation with patient history and other diagnostic information is  necessary to determine patient infection status.  Positive results do  not rule out bacterial infection or co-infection with other viruses. If result is PRESUMPTIVE POSTIVE SARS-CoV-2 nucleic acids MAY BE PRESENT.   A presumptive positive result was obtained on the submitted specimen  and confirmed on repeat testing.  While 2019 novel coronavirus  (SARS-CoV-2) nucleic acids may be present in the submitted sample  additional confirmatory testing may be necessary for epidemiological  and / or clinical management purposes  to differentiate between  SARS-CoV-2 and other Sarbecovirus currently known to infect humans.  If clinically indicated additional testing with an alternate test  methodology [phone])  is advised. The SARS-CoV-2 RNA is generally  detectable in upper and lower respiratory sp ecimens during the acute  phase of infection. The expected result is Negative. Fact Sheet for Patients:  BoilerBrush.com.cy Fact Sheet for Healthcare Providers: https://pope.com/ This test is not yet approved or cleared by the [place] FDA and has been authorized for detection and/or diagnosis of SARS-CoV-2 by FDA under an Emergency Use Authorization (EUA).  This EUA will remain  in effect (meaning this test can be used) for the duration of the COVID-19 declaration under Section 564(b)(1) of the Act, 21 U.S.C. section 360bbb-3(b)(1), unless the authorization is terminated or revoked sooner. Performed at [facility], [address], [place], [place] [address]   Urine culture     Status: None   Collection Time: 05/31/18  1:20 PM  Result Value Ref Range Status   Specimen Description   Final    URINE, RANDOM Performed at [facility], [address], [place], [place] [address]    Special Requests   Final    NONE Performed at [facility], [address], [place], [place] [address]    Culture   Final    NO GROWTH Performed at [facility], [address] [place], [place] [address]    Report Status 06/01/2018 FINAL  Final     Management plans discussed with the patient, family and they are in agreement.  CODE STATUS:  Code Status History    Date Active Date Inactive Code Status Order ID Comments User Context   05/31/2018 1535 06/03/2018 1704 Full Code [id number]  Mayo, Allyn Kenner, MD Inpatient      TOTAL TIME TAKING CARE OF THIS PATIENT: 35 minutes.    Alford Highland M.D on 06/03/2018 at 5:22 PM  Between 7am to 6pm - Pager - [phone]  After 6pm go to [email or web address] - password [email or web address]  [employer] Office  [phone]  CC: Primary care physician; Lyndon Code, MD

## 2018-06-03 NOTE — Plan of Care (Signed)

## 2018-06-03 NOTE — Progress Notes (Signed)
Occupational Therapy Treatment Patient Details Name: Gerald Martin MRN: [id number] DOB: 1965-06-26 Today's Date: 06/03/2018    History of present illness Pt is a 53 y.o. male presenting to hospital 05/31/18 with cough, dizziness, and generalized weakness.  (-) for COVID and flu.  Pt admitted with vertigo, leftward horizontal nystagmus, bradycardia, hypothermia, and htn.  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  PMH includes htn, GERD, depression.  Pt reports being on disability d/t back issues.   OT comments  Pt seen for OT tx this date. Pt reports feeling slightly better subjectively, with less dizziness this date. Pt instructed in falls prevention strategies and home/routine modifications to minimize LOB/risk of falls. Pt endorses "furniture walking" prior to admission 2/2 impaired balance. Pt required occasional verbal cues and instructional assist to use room phone and order lunch using written menu in addition to increased processing time when speaking with dietary and additional time to process and select items from menu. Pt continues to benefit from skilled OT services to maximize return to PLOF and minimize risk of falls and increased caregiver burden as well as readmission.     Follow Up Recommendations  SNF    Equipment Recommendations  3 in 1 bedside commode;Wheelchair (measurements OT);Wheelchair cushion (measurements OT)    Recommendations for Other Services      Precautions / Restrictions Precautions Precautions: Fall Precaution Comments: dizziness Restrictions Weight Bearing Restrictions: No       Mobility Bed Mobility Overal bed mobility: Needs Assistance Bed Mobility: Supine to Sit;Sit to Supine     Supine to sit: Supervision;HOB elevated Sit to supine: Supervision;HOB elevated      Transfers                      Balance Overall balance assessment: Needs assistance Sitting-balance support: No  upper extremity supported;Feet supported Sitting balance-[name]: Good                                     ADL either performed or assessed with clinical judgement   ADL                                               Vision Baseline Vision/History: No visual deficits Patient Visual Report: No change from baseline Vision Assessment?: No apparent visual deficits   Perception     Praxis      Cognition Arousal/Alertness: Awake/alert Behavior During Therapy: Flat affect;Impulsive Overall Cognitive Status: No family/caregiver present to determine baseline cognitive functioning                                 General Comments: follows commands, a little impulsive, seems to demo improved safety awareness/awareness of deficits and problem solving skills overall but continues to have deficits in this area.         Exercises Other Exercises Other Exercises: Pt instructed in falls prevention strategies and home/routine modifications to minimize LOB/risk of falls. Pt endorses "furniture walking" prior to admission 2/2 impaired balance.  Other Exercises: Pt required occasional verbal cues and instructional assist to use room phone and order lunch using written menu - pt required additional processing time when speaking with  dietary and additional time to process and select items from menu   Shoulder Instructions       General Comments      Pertinent Vitals/ Pain       Pain Assessment: No/denies pain Pain Intervention(s): Monitored during session  Home Living                                          Prior Functioning/Environment              Frequency  Min 2X/week        Progress Toward Goals  OT Goals(current goals can now be found in the care plan section)  Progress towards OT goals: Progressing toward goals  Acute Rehab OT Goals Patient Stated Goal: to go home OT Goal Formulation: With  patient Time For Goal Achievement: 06/16/18 Potential to Achieve Goals: Good  Plan Discharge plan remains appropriate;Frequency remains appropriate    Co-evaluation                 AM-PAC OT "6 Clicks" Daily Activity     Outcome Measure   Help from another person eating meals?: None Help from another person taking care of personal grooming?: A Little Help from another person toileting, which includes using toliet, bedpan, or urinal?: A Little Help from another person bathing (including washing, rinsing, drying)?: A Little Help from another person to put on and taking off regular upper body clothing?: A Little Help from another person to put on and taking off regular lower body clothing?: A Little 6 Click Score: 19    End of Session    OT Visit Diagnosis: Other abnormalities of gait and mobility (R26.89);Hemiplegia and hemiparesis;Dizziness and giddiness (R42) Hemiplegia - Right/Left: Right Hemiplegia - dominant/non-dominant: Dominant Hemiplegia - caused by: Cerebral infarction   Activity Tolerance Patient tolerated treatment well   Patient Left in bed;with call bell/phone within reach;with bed alarm set   Nurse Communication          Time: [id number] OT Time Calculation (min): 18 min  Charges: OT General Charges $OT Visit: 1 Visit OT Treatments $Self Care/Home Management : 8-22 mins  Richrd PrimeJamie Stiller, MPH, MS, OTR/L ascom [phone] 06/03/18, 12:32 PM

## 2018-06-03 NOTE — Progress Notes (Signed)
Ch visited w/ pt to see how well the pt was progressing. Pt shared that he was feeling a little better but shared he was weak on his R side. Pt possibly had a stroke and was hoping to see the provider soon. Ch allowed pt time to lament and share his grief related to losing his GF 2 years ago. 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 Ch shared that it was normal to feel guilty but reassured him that he did all that he could. Pt shared that he enjoys going to church but has not been able to get in contact w/ his AA sponsor since he has been in the hospital. [facility] encouraged pt to continue to seek that type of support but to be careful going into spaces where ppl are gathering due to his health hx. Pt understood and appreciated visit.    06/03/18 1100  Clinical Encounter Type  Visited With Patient  Visit Type Psychological support;Spiritual support;Social support  Spiritual Encounters  Spiritual Needs Emotional;Grief support;Prayer  Stress Factors  Patient Stress Factors Exhausted;Health changes;Major life changes;Loss;Lack of caregivers  Family Stress Factors None identified

## 2018-06-03 NOTE — Progress Notes (Signed)
Patient given discharge instructions. IV's taken out and tele monitor off. Patient verbalized understanding with no questions or concerns. Patient being picked up by friend. Walker delivered to the room.

## 2018-06-03 NOTE — Progress Notes (Signed)
OT Cancellation Note  Patient Details Name: Gerald Martin MRN: [id number] DOB: October 09, 1965   Cancelled Treatment:    Reason Eval/Treat Not Completed: Other (comment). Pt with MD in room. Will re-attempt at later time as pt is available.  Richrd Prime, MPH, MS, OTR/L ascom [phone] 06/03/18, 11:24 AM

## 2018-06-03 NOTE — TOC Transition Note (Signed)
Transition of Care [facility]) - CM/SW Discharge Note   Patient Details  Name: Gerald Martin MRN: [id number] Date of Birth: 07-31-1965  [facility]) CM/SW Contact:  Sherren Kerns, RN Phone Number: 06/03/2018, 1:19 PM   Clinical Narrative:   Discharging today to home with self.  PT recommended SNF-patient refuses.  Offered home health services and patient is accepting of that plan; no preference of agency.  Referral to [facility] for RN, PT, OT and aide.  Barbara Cower accepted and is aware of DC today.  Current with PCP; obtains medications at [facility] without difficulty.  A friend will be transporting home today.  Referral to [facility] with [facility] for walker that will be appropriate for his height which is 6'4".  No further needs identified at this time.      Final next level of care: Home w Home Health Services Barriers to Discharge: No Barriers Identified   Patient Goals and CMS Choice Patient states their goals for this hospitalization and ongoing recovery are:: go home with home health; does not want SNF CMS Medicare.gov Compare Post Acute Care list provided to:: Patient Choice offered to / list presented to : Patient  Discharge Placement                       Discharge Plan and Services   Discharge Planning Services: CM Consult Post Acute Care Choice: Home Health          DME Arranged: Dan Humphreys DME Agency: [facility] Date DME Agency Contacted: 06/03/18 Time DME Agency Contacted: 1316 Representative spoke with at DME Agency: Nida Boatman HH Arranged: RN, PT, OT, Nurse's Aide HH Agency: [facility] Date HH Agency Contacted: 06/03/18 Time HH Agency Contacted: 1318 Representative spoke with at [facility] Agency: Barbara Cower   Social Determinants of Health (SDOH) Interventions     Readmission Risk Interventions No flowsheet data found.

## 2018-06-04 ENCOUNTER — Telehealth: Payer: Self-pay

## 2018-06-04 ENCOUNTER — Telehealth: Payer: Self-pay | Admitting: Nurse Practitioner

## 2018-06-04 ENCOUNTER — Other Ambulatory Visit: Payer: Self-pay | Admitting: Nurse Practitioner

## 2018-06-04 ENCOUNTER — Encounter: Payer: Self-pay | Admitting: Nurse Practitioner

## 2018-06-04 DIAGNOSIS — I10 Essential (primary) hypertension: Secondary | ICD-10-CM | POA: Diagnosis not present

## 2018-06-04 DIAGNOSIS — F17210 Nicotine dependence, cigarettes, uncomplicated: Secondary | ICD-10-CM | POA: Diagnosis not present

## 2018-06-04 DIAGNOSIS — Z7982 Long term (current) use of aspirin: Secondary | ICD-10-CM | POA: Diagnosis not present

## 2018-06-04 DIAGNOSIS — R2681 Unsteadiness on feet: Secondary | ICD-10-CM | POA: Diagnosis not present

## 2018-06-04 DIAGNOSIS — Z791 Long term (current) use of non-steroidal anti-inflammatories (NSAID): Secondary | ICD-10-CM | POA: Diagnosis not present

## 2018-06-04 DIAGNOSIS — I69398 Other sequelae of cerebral infarction: Secondary | ICD-10-CM | POA: Diagnosis not present

## 2018-06-04 DIAGNOSIS — K219 Gastro-esophageal reflux disease without esophagitis: Secondary | ICD-10-CM

## 2018-06-04 DIAGNOSIS — R42 Dizziness and giddiness: Secondary | ICD-10-CM | POA: Diagnosis not present

## 2018-06-04 DIAGNOSIS — Z7902 Long term (current) use of antithrombotics/antiplatelets: Secondary | ICD-10-CM | POA: Diagnosis not present

## 2018-06-04 DIAGNOSIS — J449 Chronic obstructive pulmonary disease, unspecified: Secondary | ICD-10-CM | POA: Diagnosis not present

## 2018-06-04 MED ORDER — FAMOTIDINE 10 MG PO TABS: 10.0000 mg | ORAL_TABLET | Freq: Two times a day (BID) | ORAL | 3 refills | Status: DC

## 2018-06-04 NOTE — Telephone Encounter (Signed)
Gave verbal order to [facility] [phone] for nursing

## 2018-06-04 NOTE — Progress Notes (Signed)
rantidine d/c per manufacturer. Change to pepcid 20mg  which is BID and sent new prescription to pharmacy.

## 2018-06-04 NOTE — Telephone Encounter (Signed)
Pharmacy called ranitidine has been discontinued , pharmacy is recommending omeprazole , would you like to switch it to this?

## 2018-06-04 NOTE — Telephone Encounter (Signed)
rantidine d/c per manufacturer. Change to pepcid 20mg which is BID and sent new prescription to pharmacy.  

## 2018-06-05 DIAGNOSIS — F17210 Nicotine dependence, cigarettes, uncomplicated: Secondary | ICD-10-CM | POA: Diagnosis not present

## 2018-06-05 DIAGNOSIS — I10 Essential (primary) hypertension: Secondary | ICD-10-CM | POA: Diagnosis not present

## 2018-06-05 DIAGNOSIS — J449 Chronic obstructive pulmonary disease, unspecified: Secondary | ICD-10-CM | POA: Diagnosis not present

## 2018-06-05 DIAGNOSIS — R2681 Unsteadiness on feet: Secondary | ICD-10-CM | POA: Diagnosis not present

## 2018-06-05 DIAGNOSIS — R42 Dizziness and giddiness: Secondary | ICD-10-CM | POA: Diagnosis not present

## 2018-06-05 DIAGNOSIS — I69398 Other sequelae of cerebral infarction: Secondary | ICD-10-CM | POA: Diagnosis not present

## 2018-06-05 LAB — CULTURE, BLOOD (ROUTINE X 2)
Culture: NO GROWTH
Culture: NO GROWTH
Special Requests: ADEQUATE

## 2018-06-06 ENCOUNTER — Telehealth: Payer: Self-pay

## 2018-06-06 ENCOUNTER — Ambulatory Visit: Payer: Self-pay | Admitting: Nurse Practitioner

## 2018-06-06 DIAGNOSIS — F17210 Nicotine dependence, cigarettes, uncomplicated: Secondary | ICD-10-CM | POA: Diagnosis not present

## 2018-06-06 DIAGNOSIS — I69398 Other sequelae of cerebral infarction: Secondary | ICD-10-CM | POA: Diagnosis not present

## 2018-06-06 DIAGNOSIS — I10 Essential (primary) hypertension: Secondary | ICD-10-CM | POA: Diagnosis not present

## 2018-06-06 DIAGNOSIS — R42 Dizziness and giddiness: Secondary | ICD-10-CM | POA: Diagnosis not present

## 2018-06-06 DIAGNOSIS — R2681 Unsteadiness on feet: Secondary | ICD-10-CM | POA: Diagnosis not present

## 2018-06-06 DIAGNOSIS — J449 Chronic obstructive pulmonary disease, unspecified: Secondary | ICD-10-CM | POA: Diagnosis not present

## 2018-06-06 NOTE — Telephone Encounter (Signed)
Gerald Martin from [facility] called stating that she is currently with the patient and his blood pressure is elevated. He has taken all of his medications today and all other vitals are fine, the pt did mention that he has a slight headache but overall ok. He is aware that he has an appointment in office tomorrow to see Herbert Seta.  Blood pressure is 160/110.

## 2018-06-07 ENCOUNTER — Other Ambulatory Visit: Payer: Self-pay

## 2018-06-07 ENCOUNTER — Encounter: Payer: Self-pay | Admitting: Nurse Practitioner

## 2018-06-07 ENCOUNTER — Telehealth: Payer: Self-pay | Admitting: Internal Medicine

## 2018-06-07 ENCOUNTER — Ambulatory Visit (INDEPENDENT_AMBULATORY_CARE_PROVIDER_SITE_OTHER): Payer: Medicare Other | Admitting: Nurse Practitioner

## 2018-06-07 ENCOUNTER — Ambulatory Visit: Payer: Self-pay | Admitting: Nurse Practitioner

## 2018-06-07 VITALS — BP 110/70 | HR 72 | Resp 16 | Ht 76.0 in | Wt 175.8 lb

## 2018-06-07 DIAGNOSIS — I6340 Cerebral infarction due to embolism of unspecified cerebral artery: Secondary | ICD-10-CM | POA: Diagnosis not present

## 2018-06-07 DIAGNOSIS — E782 Mixed hyperlipidemia: Secondary | ICD-10-CM

## 2018-06-07 DIAGNOSIS — E042 Nontoxic multinodular goiter: Secondary | ICD-10-CM

## 2018-06-07 DIAGNOSIS — F5102 Adjustment insomnia: Secondary | ICD-10-CM | POA: Diagnosis not present

## 2018-06-07 DIAGNOSIS — R42 Dizziness and giddiness: Secondary | ICD-10-CM

## 2018-06-07 MED ORDER — ATORVASTATIN CALCIUM 40 MG PO TABS: 40.0000 mg | ORAL_TABLET | Freq: Every day | ORAL | 3 refills | Status: DC

## 2018-06-07 MED ORDER — MECLIZINE HCL 25 MG PO TABS: 25.0000 mg | ORAL_TABLET | Freq: Three times a day (TID) | ORAL | 3 refills | Status: AC | PRN

## 2018-06-07 MED ORDER — CLOPIDOGREL BISULFATE 75 MG PO TABS: 75.0000 mg | ORAL_TABLET | Freq: Every day | ORAL | 3 refills | Status: DC

## 2018-06-07 MED ORDER — TRAZODONE HCL 100 MG PO TABS: 50.0000 mg | ORAL_TABLET | Freq: Every day | ORAL | 3 refills | Status: DC

## 2018-06-07 MED ORDER — ASPIRIN 81 MG PO TBEC: 81.0000 mg | DELAYED_RELEASE_TABLET | Freq: Every day | ORAL | 3 refills | Status: DC

## 2018-06-07 NOTE — Telephone Encounter (Signed)
VERBAL ORDERS GIVEN TO NANCY FOR 1 WEEK 4 .

## 2018-06-07 NOTE — Progress Notes (Signed)
[facility] [address] [place], [place] [address]  Internal MEDICINE  Office Visit Note  Patient Name: Gerald Martin  [id number]  [id number]  Date of Service: 06/25/2018  Pt is here for recent hospital follow up.   Chief Complaint  Patient presents with  . Hospitalization Follow-up    Follow up  . Hypertension    pt blood pressure elevated on yesterday OT called to report it. pt had headache but other than the headache the pt was feeling fine.     The patient is here for hospital follow up. 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 Was started on plavix and statin. Thyroid ultrasound done in the hospital showing multiple nodules <1.5cm in diameter. Biopsy has been recommended. He now has physical therapy and nursing care coming to his home to help with rehabilitation process. He states that he is no longer drinking. Is attending AA meetings every week. He has asked that dose of trazodone be lowered. He states that he is sleeping very hard and will have episodes of urinary incontinence in the night.    Current Medication: Outpatient Encounter Medications as of 06/07/2018  Medication Sig Note  . amLODipine (NORVASC) 10 MG tablet Take 1 tablet (10 mg total) by mouth daily.   Marland Kitchen aspirin 81 MG EC tablet Take 1 tablet (81 mg total) by mouth daily.   Marland Kitchen atorvastatin (LIPITOR) 40 MG tablet Take 1 tablet (40 mg total) by mouth daily at 6 PM.   . carvedilol (COREG) 12.5 MG tablet TAKE 1 TABLET BY MOUTH TWICE DAILY WITH MEALS   . clopidogrel (PLAVIX) 75 MG tablet Take 1 tablet (75 mg total) by mouth daily.   . COMBIVENT RESPIMAT 20-100 MCG/ACT AERS respimat Inhale 1 puff into the lungs 4 (four) times daily.   . famotidine (PEPCID) 10 MG tablet Take 1 tablet (10 mg total) by mouth 2 (two) times daily.   Marland Kitchen FLUoxetine (PROZAC) 40 MG capsule TAKE 1 CAPSULE BY MOUTH DAILY   . HYDROcodone-acetaminophen (NORCO) 5-325 MG tablet Take 1 tablet by mouth every 6 (six) hours as needed for up to  15 doses for severe pain.   . Ipratropium-Albuterol (COMBIVENT RESPIMAT) 20-100 MCG/ACT AERS respimat Inhale 1 puff into the lungs 4 (four) times daily.   Marland Kitchen lisinopril (ZESTRIL) 40 MG tablet Take 40 mg by mouth daily.   . meclizine (ANTIVERT) 25 MG tablet Take 1 tablet (25 mg total) by mouth 3 (three) times daily as needed for dizziness.   Marland Kitchen QUEtiapine (SEROQUEL) 100 MG tablet Take 1 tablet (100 mg total) by mouth at bedtime.   Marland Kitchen SPIRIVA HANDIHALER 18 MCG inhalation capsule Place 1 puff into inhaler and inhale daily.   Marland Kitchen tiotropium (SPIRIVA) 18 MCG inhalation capsule Place 1 capsule (18 mcg total) into inhaler and inhale daily.   . traMADol (ULTRAM) 50 MG tablet Take 1 tablet (50 mg total) by mouth 3 (three) times daily as needed. for pain   . traZODone (DESYREL) 100 MG tablet Take 0.5 tablets (50 mg total) by mouth at bedtime.   . [DISCONTINUED] aspirin EC 81 MG EC tablet Take 1 tablet (81 mg total) by mouth daily.   . [DISCONTINUED] atorvastatin (LIPITOR) 40 MG tablet Take 1 tablet (40 mg total) by mouth daily at 6 PM.   . [DISCONTINUED] clopidogrel (PLAVIX) 75 MG tablet Take 1 tablet (75 mg total) by mouth daily.   . [DISCONTINUED] ibuprofen (ADVIL) 600 MG tablet Take 1 tablet (600 mg total) by mouth every  8 (eight) hours as needed. 06/19/2018: d/c because patient is not on plavix  . [DISCONTINUED] meclizine (ANTIVERT) 25 MG tablet Take 1 tablet (25 mg total) by mouth 3 (three) times daily as needed for dizziness.   . [DISCONTINUED] traZODone (DESYREL) 100 MG tablet Take 100 mg by mouth at bedtime.   . [DISCONTINUED] amLODipine (NORVASC) 10 MG tablet Take 10 mg by mouth daily.   . [DISCONTINUED] FLUoxetine (PROZAC) 40 MG capsule Take 40 mg by mouth daily.   . [DISCONTINUED] lisinopril (PRINIVIL,ZESTRIL) 40 MG tablet Take 1 tablet (40 mg total) by mouth daily. (Patient not taking: Reported on 06/07/2018)   . [DISCONTINUED] traZODone (DESYREL) 100 MG tablet Take one tab po qhs for sleep (Patient not  taking: Reported on 06/07/2018)    No facility-administered encounter medications on file as of 06/07/2018.     Surgical History: Past Surgical History:  Procedure Laterality Date  . arm surgery[Other] Left    chain saw injury  . chronic pain    . LEG SURGERY Right     Medical History: Past Medical History:  Diagnosis Date  . Anxiety   . Chronic pain    back  . COPD (chronic obstructive pulmonary disease) (HCC)   . Depression   . GERD (gastroesophageal reflux disease)   . Hyperlipidemia   . Hypertension   . Stroke [facility])     Family History: Family History  Problem Relation Age of Onset  . Stroke Mother   . Diabetes Father     Social History   Socioeconomic History  . Marital status: Single    Spouse name: Not on file  . Number of children: Not on file  . Years of education: Not on file  . Highest education level: Not on file  Occupational History  . Not on file  Social Needs  . Financial resource strain: Not on file  . Food insecurity:    Worry: Not on file    Inability: Not on file  . Transportation needs:    Medical: Not on file    Non-medical: Not on file  Tobacco Use  . Smoking status: Current Every Day Smoker    Packs/day: 1.00    Years: 30.00    Pack years: 30.00  . Smokeless tobacco: Never Used  Substance and Sexual Activity  . Alcohol use: Yes  . Drug use: Never  . Sexual activity: Not on file  Lifestyle  . Physical activity:    Days per week: Not on file    Minutes per session: Not on file  . Stress: Not on file  Relationships  . Social connections:    Talks on phone: Not on file    Gets together: Not on file    Attends religious service: Not on file    Active member of club or organization: Not on file    Attends meetings of clubs or organizations: Not on file    Relationship status: Not on file  . Intimate partner violence:    Fear of current or ex partner: Not on file    Emotionally abused: Not on file    Physically abused: Not  on file    Forced sexual activity: Not on file  Other Topics Concern  . Not on file  Social History Narrative   ** Merged History Encounter **          Review of Systems  Constitutional: Positive for activity change. Negative for chills, fatigue and unexpected weight change.  HENT: Negative for  congestion, rhinorrhea, sneezing and sore throat.   Respiratory: Negative for cough, chest tightness, shortness of breath and wheezing.   Cardiovascular: Negative for chest pain and palpitations.  Gastrointestinal: Negative for abdominal pain, constipation, diarrhea, nausea and vomiting.  Endocrine: Negative for cold intolerance, heat intolerance, polydipsia and polyuria.       Thyroid nodules found on ultrasound done during his hospitalization.  Musculoskeletal: Positive for arthralgias and back pain. Negative for joint swelling and neck pain.  Skin: Negative for rash.  Allergic/Immunologic: Negative for environmental allergies.  Neurological: Positive for speech difficulty and weakness. Negative for tremors, numbness and headaches.  Hematological: Negative for adenopathy. Does not bruise/bleed easily.  Psychiatric/Behavioral: Positive for dysphoric mood and sleep disturbance. Negative for behavioral problems and suicidal ideas. The patient is not nervous/anxious.        Patient states he is no longer drinking or doing any illegal drugs. Mental health stable .    Today's Vitals   06/07/18 1506  BP: 110/70  Pulse: 72  Resp: 16  SpO2: 95%  Weight: 175 lb 12.8 oz (79.7 kg)  Height: 6\' 4"  (1.93 m)   Body mass index is 21.4 kg/m.  Physical Exam Vitals signs and nursing note reviewed.  Constitutional:      General: He is not in acute distress.    Appearance: Normal appearance. He is well-developed. He is not diaphoretic.  HENT:     Head: Normocephalic and atraumatic.     Mouth/Throat:     Pharynx: No oropharyngeal exudate.  Eyes:     Conjunctiva/sclera: Conjunctivae normal.      Pupils: Pupils are equal, round, and reactive to light.  Neck:     Musculoskeletal: Normal range of motion and neck supple.     Thyroid: No thyromegaly.     Vascular: No carotid bruit or JVD.     Trachea: No tracheal deviation.  Cardiovascular:     Rate and Rhythm: Normal rate and regular rhythm.     Heart sounds: Normal heart sounds. No murmur. No friction rub. No gallop.   Pulmonary:     Effort: Pulmonary effort is normal. No respiratory distress.     Breath sounds: Normal breath sounds. No wheezing or rales.  Chest:     Chest wall: No tenderness.  Abdominal:     General: Bowel sounds are normal.     Palpations: Abdomen is soft.     Tenderness: There is no abdominal tenderness. There is no guarding.  Musculoskeletal: Normal range of motion.     Comments: Patient using a walker to assist his gait .  Lymphadenopathy:     Cervical: No cervical adenopathy.  Skin:    General: Skin is warm and dry.  Neurological:     Mental Status: He is alert and oriented to person, place, and time.     Cranial Nerves: No cranial nerve deficit.     Gait: Gait abnormal.     Comments: Mild, generalized weakness present after suffering a stroke. Using a walker to help with mobility.   Psychiatric:        Behavior: Behavior normal.        Thought Content: Thought content normal.        Judgment: Judgment normal.   Assessment/Plan: 1. Cerebrovascular accident (CVA) due to embolism of cerebral artery [facility](HCC) Reviewed hospital records with the patient. Suffered 5mm acute infarct in medulla on the right. Recovering well. Has physical and occupational therapists coming to his home to help with recovery. Continue plavix,  atorvastatin, and aspirin started in the hospital.  - aspirin 81 MG EC tablet; Take 1 tablet (81 mg total) by mouth daily.  Dispense: 30 tablet; Refill: 3 - atorvastatin (LIPITOR) 40 MG tablet; Take 1 tablet (40 mg total) by mouth daily at 6 PM.  Dispense: 30 tablet; Refill: 3 - clopidogrel  (PLAVIX) 75 MG tablet; Take 1 tablet (75 mg total) by mouth daily.  Dispense: 30 tablet; Refill: 3  2. Multiple thyroid nodules Found on ultrasound of the neck while hospitalized. Refer to endocrinology for further evaluation and treatment.  - Ambulatory referral to Endocrinology  3. Adjustment insomnia May take trazodone , reduce dose to 1/2 tablet at night as needed  - traZODone (DESYREL) 100 MG tablet; Take 0.5 tablets (50 mg total) by mouth at bedtime.  Dispense: 30 tablet; Refill: 3  4. Dizziness May take meclizine 25 up to three timse daily as needed for dizziness  - meclizine (ANTIVERT) 25 MG tablet; Take 1 tablet (25 mg total) by mouth 3 (three) times daily as needed for dizziness.  Dispense: 30 tablet; Refill: 3  5. Mixed hyperlipidemia - atorvastatin (LIPITOR) 40 MG tablet; Take 1 tablet (40 mg total) by mouth daily at 6 PM.  Dispense: 30 tablet; Refill: 3  General Counseling: Alim verbalizes understanding of the findings of todays visit and agrees with plan of treatment. I have discussed any further diagnostic evaluation that may be needed or ordered today. We also reviewed his medications today. he has been encouraged to call the office with any questions or concerns that should arise related to todays visit.    Counseling:  Counseling: Adherence of Medical Therapy: The patient understands that it is the responsibility of the patient to complete all prescribed medications, all recommended testing, including but not limited to, laboratory studies and imaging. The patient further understands the need to keep all scheduled follow-up visits and to inform the office immediately of any changes in their medical condition. The patient understands that the success of treatment in large part depends on the patient's willingness to complete the therapeutic regimen and to work in partnership with the designated health-care providers.  This patient was seen by Vincent Gros FNP  Collaboration with Dr Lyndon Code as a part of collaborative care agreement  Orders Placed This Encounter  Procedures  . Ambulatory referral to Endocrinology      I have reviewed all medical records from hospital follow up including radiology reports and consults from other physicians. Appropriate follow up diagnostics will be scheduled as needed. Patient/ Family understands the plan of treatment. Time spent 30 minutes.   Dr Lyndon Code, MD Internal Medicine

## 2018-06-10 ENCOUNTER — Telehealth: Payer: Self-pay

## 2018-06-10 DIAGNOSIS — R42 Dizziness and giddiness: Secondary | ICD-10-CM | POA: Diagnosis not present

## 2018-06-10 DIAGNOSIS — J449 Chronic obstructive pulmonary disease, unspecified: Secondary | ICD-10-CM | POA: Diagnosis not present

## 2018-06-10 DIAGNOSIS — I69398 Other sequelae of cerebral infarction: Secondary | ICD-10-CM | POA: Diagnosis not present

## 2018-06-10 DIAGNOSIS — I10 Essential (primary) hypertension: Secondary | ICD-10-CM | POA: Diagnosis not present

## 2018-06-10 DIAGNOSIS — R2681 Unsteadiness on feet: Secondary | ICD-10-CM | POA: Diagnosis not present

## 2018-06-10 DIAGNOSIS — F17210 Nicotine dependence, cigarettes, uncomplicated: Secondary | ICD-10-CM | POA: Diagnosis not present

## 2018-06-10 NOTE — Telephone Encounter (Signed)
Gave verbal order Gerald Martin [phone] twice a week for 4 week for home PT

## 2018-06-11 DIAGNOSIS — J449 Chronic obstructive pulmonary disease, unspecified: Secondary | ICD-10-CM | POA: Diagnosis not present

## 2018-06-11 DIAGNOSIS — R2681 Unsteadiness on feet: Secondary | ICD-10-CM | POA: Diagnosis not present

## 2018-06-11 DIAGNOSIS — R42 Dizziness and giddiness: Secondary | ICD-10-CM | POA: Diagnosis not present

## 2018-06-11 DIAGNOSIS — I10 Essential (primary) hypertension: Secondary | ICD-10-CM | POA: Diagnosis not present

## 2018-06-11 DIAGNOSIS — I69398 Other sequelae of cerebral infarction: Secondary | ICD-10-CM | POA: Diagnosis not present

## 2018-06-11 DIAGNOSIS — F17210 Nicotine dependence, cigarettes, uncomplicated: Secondary | ICD-10-CM | POA: Diagnosis not present

## 2018-06-13 DIAGNOSIS — R42 Dizziness and giddiness: Secondary | ICD-10-CM | POA: Diagnosis not present

## 2018-06-13 DIAGNOSIS — J449 Chronic obstructive pulmonary disease, unspecified: Secondary | ICD-10-CM | POA: Diagnosis not present

## 2018-06-13 DIAGNOSIS — I10 Essential (primary) hypertension: Secondary | ICD-10-CM | POA: Diagnosis not present

## 2018-06-13 DIAGNOSIS — F17210 Nicotine dependence, cigarettes, uncomplicated: Secondary | ICD-10-CM | POA: Diagnosis not present

## 2018-06-13 DIAGNOSIS — I69398 Other sequelae of cerebral infarction: Secondary | ICD-10-CM | POA: Diagnosis not present

## 2018-06-13 DIAGNOSIS — R2681 Unsteadiness on feet: Secondary | ICD-10-CM | POA: Diagnosis not present

## 2018-06-18 ENCOUNTER — Telehealth: Payer: Self-pay

## 2018-06-18 DIAGNOSIS — R2681 Unsteadiness on feet: Secondary | ICD-10-CM | POA: Diagnosis not present

## 2018-06-18 DIAGNOSIS — J449 Chronic obstructive pulmonary disease, unspecified: Secondary | ICD-10-CM | POA: Diagnosis not present

## 2018-06-18 DIAGNOSIS — I69398 Other sequelae of cerebral infarction: Secondary | ICD-10-CM | POA: Diagnosis not present

## 2018-06-18 DIAGNOSIS — F17210 Nicotine dependence, cigarettes, uncomplicated: Secondary | ICD-10-CM | POA: Diagnosis not present

## 2018-06-18 DIAGNOSIS — R42 Dizziness and giddiness: Secondary | ICD-10-CM | POA: Diagnosis not present

## 2018-06-18 DIAGNOSIS — I10 Essential (primary) hypertension: Secondary | ICD-10-CM | POA: Diagnosis not present

## 2018-06-18 NOTE — Telephone Encounter (Signed)
TRISHA FROM [facility] CALLED TO INFORM us THAT PT HAS NOT HAD NURSE VISITS THIS PAST WEEK. PT HAS KEPT UP WITH PHYSICAL THERAPY AND OCCUPATIONAL THERAPY. NURSE CALLED PT Friday TO SEE IF PT WOULD LIKE TO BE SEEN THAT EVENING BECAUSE HE WAS NOT AVAILABLE DURING THE DAY DURING HIS APPT TIME. THEY WENT BY TO SEE HIM IN THE EVENING BUT AGAIN HE WAS NOT THERE. PT HAS APPT SCHEDULED TODAY FOR A NURSE VISIT. NURSE WILL DISCUSS LAST WEEK WITH HIM.

## 2018-06-19 ENCOUNTER — Other Ambulatory Visit: Payer: Self-pay | Admitting: Nurse Practitioner

## 2018-06-19 ENCOUNTER — Encounter: Payer: Self-pay | Admitting: Internal Medicine

## 2018-06-19 ENCOUNTER — Other Ambulatory Visit: Payer: Self-pay

## 2018-06-19 DIAGNOSIS — M545 Low back pain, unspecified: Secondary | ICD-10-CM

## 2018-06-19 DIAGNOSIS — I10 Essential (primary) hypertension: Secondary | ICD-10-CM | POA: Diagnosis not present

## 2018-06-19 DIAGNOSIS — F17210 Nicotine dependence, cigarettes, uncomplicated: Secondary | ICD-10-CM | POA: Diagnosis not present

## 2018-06-19 DIAGNOSIS — J449 Chronic obstructive pulmonary disease, unspecified: Secondary | ICD-10-CM | POA: Diagnosis not present

## 2018-06-19 DIAGNOSIS — R2681 Unsteadiness on feet: Secondary | ICD-10-CM | POA: Diagnosis not present

## 2018-06-19 DIAGNOSIS — R42 Dizziness and giddiness: Secondary | ICD-10-CM | POA: Diagnosis not present

## 2018-06-19 DIAGNOSIS — I69398 Other sequelae of cerebral infarction: Secondary | ICD-10-CM | POA: Diagnosis not present

## 2018-06-19 NOTE — Progress Notes (Signed)
D/c ibuprofen as patient is now on plavix after suffering a stroke.

## 2018-06-21 DIAGNOSIS — J449 Chronic obstructive pulmonary disease, unspecified: Secondary | ICD-10-CM | POA: Diagnosis not present

## 2018-06-21 DIAGNOSIS — R2681 Unsteadiness on feet: Secondary | ICD-10-CM | POA: Diagnosis not present

## 2018-06-21 DIAGNOSIS — I10 Essential (primary) hypertension: Secondary | ICD-10-CM | POA: Diagnosis not present

## 2018-06-21 DIAGNOSIS — I69398 Other sequelae of cerebral infarction: Secondary | ICD-10-CM | POA: Diagnosis not present

## 2018-06-21 DIAGNOSIS — R42 Dizziness and giddiness: Secondary | ICD-10-CM | POA: Diagnosis not present

## 2018-06-21 DIAGNOSIS — F17210 Nicotine dependence, cigarettes, uncomplicated: Secondary | ICD-10-CM | POA: Diagnosis not present

## 2018-06-25 DIAGNOSIS — R42 Dizziness and giddiness: Secondary | ICD-10-CM | POA: Diagnosis not present

## 2018-06-25 DIAGNOSIS — F17210 Nicotine dependence, cigarettes, uncomplicated: Secondary | ICD-10-CM | POA: Diagnosis not present

## 2018-06-25 DIAGNOSIS — I639 Cerebral infarction, unspecified: Secondary | ICD-10-CM | POA: Insufficient documentation

## 2018-06-25 DIAGNOSIS — I69398 Other sequelae of cerebral infarction: Secondary | ICD-10-CM | POA: Diagnosis not present

## 2018-06-25 DIAGNOSIS — F5102 Adjustment insomnia: Secondary | ICD-10-CM | POA: Insufficient documentation

## 2018-06-25 DIAGNOSIS — I6340 Cerebral infarction due to embolism of unspecified cerebral artery: Secondary | ICD-10-CM | POA: Insufficient documentation

## 2018-06-25 DIAGNOSIS — E782 Mixed hyperlipidemia: Secondary | ICD-10-CM | POA: Insufficient documentation

## 2018-06-25 DIAGNOSIS — I10 Essential (primary) hypertension: Secondary | ICD-10-CM | POA: Diagnosis not present

## 2018-06-25 DIAGNOSIS — J449 Chronic obstructive pulmonary disease, unspecified: Secondary | ICD-10-CM | POA: Diagnosis not present

## 2018-06-25 DIAGNOSIS — R2681 Unsteadiness on feet: Secondary | ICD-10-CM | POA: Diagnosis not present

## 2018-06-25 DIAGNOSIS — E042 Nontoxic multinodular goiter: Secondary | ICD-10-CM | POA: Insufficient documentation

## 2018-06-26 DIAGNOSIS — R2681 Unsteadiness on feet: Secondary | ICD-10-CM | POA: Diagnosis not present

## 2018-06-26 DIAGNOSIS — I69398 Other sequelae of cerebral infarction: Secondary | ICD-10-CM | POA: Diagnosis not present

## 2018-06-26 DIAGNOSIS — I10 Essential (primary) hypertension: Secondary | ICD-10-CM | POA: Diagnosis not present

## 2018-06-26 DIAGNOSIS — J449 Chronic obstructive pulmonary disease, unspecified: Secondary | ICD-10-CM | POA: Diagnosis not present

## 2018-06-26 DIAGNOSIS — R42 Dizziness and giddiness: Secondary | ICD-10-CM | POA: Diagnosis not present

## 2018-06-26 DIAGNOSIS — F17210 Nicotine dependence, cigarettes, uncomplicated: Secondary | ICD-10-CM | POA: Diagnosis not present

## 2018-06-27 DIAGNOSIS — R42 Dizziness and giddiness: Secondary | ICD-10-CM | POA: Diagnosis not present

## 2018-06-27 DIAGNOSIS — I10 Essential (primary) hypertension: Secondary | ICD-10-CM | POA: Diagnosis not present

## 2018-06-27 DIAGNOSIS — J449 Chronic obstructive pulmonary disease, unspecified: Secondary | ICD-10-CM | POA: Diagnosis not present

## 2018-06-27 DIAGNOSIS — I69398 Other sequelae of cerebral infarction: Secondary | ICD-10-CM | POA: Diagnosis not present

## 2018-06-27 DIAGNOSIS — F17210 Nicotine dependence, cigarettes, uncomplicated: Secondary | ICD-10-CM | POA: Diagnosis not present

## 2018-06-27 DIAGNOSIS — R2681 Unsteadiness on feet: Secondary | ICD-10-CM | POA: Diagnosis not present

## 2018-07-03 DIAGNOSIS — F17210 Nicotine dependence, cigarettes, uncomplicated: Secondary | ICD-10-CM | POA: Diagnosis not present

## 2018-07-03 DIAGNOSIS — J449 Chronic obstructive pulmonary disease, unspecified: Secondary | ICD-10-CM | POA: Diagnosis not present

## 2018-07-03 DIAGNOSIS — R42 Dizziness and giddiness: Secondary | ICD-10-CM | POA: Diagnosis not present

## 2018-07-03 DIAGNOSIS — R2681 Unsteadiness on feet: Secondary | ICD-10-CM | POA: Diagnosis not present

## 2018-07-03 DIAGNOSIS — I10 Essential (primary) hypertension: Secondary | ICD-10-CM | POA: Diagnosis not present

## 2018-07-03 DIAGNOSIS — I69398 Other sequelae of cerebral infarction: Secondary | ICD-10-CM | POA: Diagnosis not present

## 2018-07-04 DIAGNOSIS — Z791 Long term (current) use of non-steroidal anti-inflammatories (NSAID): Secondary | ICD-10-CM | POA: Diagnosis not present

## 2018-07-04 DIAGNOSIS — F17210 Nicotine dependence, cigarettes, uncomplicated: Secondary | ICD-10-CM | POA: Diagnosis not present

## 2018-07-04 DIAGNOSIS — Z7902 Long term (current) use of antithrombotics/antiplatelets: Secondary | ICD-10-CM | POA: Diagnosis not present

## 2018-07-04 DIAGNOSIS — Z7982 Long term (current) use of aspirin: Secondary | ICD-10-CM | POA: Diagnosis not present

## 2018-07-04 DIAGNOSIS — R2681 Unsteadiness on feet: Secondary | ICD-10-CM | POA: Diagnosis not present

## 2018-07-04 DIAGNOSIS — I69398 Other sequelae of cerebral infarction: Secondary | ICD-10-CM | POA: Diagnosis not present

## 2018-07-04 DIAGNOSIS — J449 Chronic obstructive pulmonary disease, unspecified: Secondary | ICD-10-CM | POA: Diagnosis not present

## 2018-07-04 DIAGNOSIS — I10 Essential (primary) hypertension: Secondary | ICD-10-CM | POA: Diagnosis not present

## 2018-07-04 DIAGNOSIS — R42 Dizziness and giddiness: Secondary | ICD-10-CM | POA: Diagnosis not present

## 2018-07-05 DIAGNOSIS — F17210 Nicotine dependence, cigarettes, uncomplicated: Secondary | ICD-10-CM | POA: Diagnosis not present

## 2018-07-05 DIAGNOSIS — I69398 Other sequelae of cerebral infarction: Secondary | ICD-10-CM | POA: Diagnosis not present

## 2018-07-05 DIAGNOSIS — I10 Essential (primary) hypertension: Secondary | ICD-10-CM | POA: Diagnosis not present

## 2018-07-05 DIAGNOSIS — R42 Dizziness and giddiness: Secondary | ICD-10-CM | POA: Diagnosis not present

## 2018-07-05 DIAGNOSIS — E041 Nontoxic single thyroid nodule: Secondary | ICD-10-CM | POA: Diagnosis not present

## 2018-07-05 DIAGNOSIS — R7989 Other specified abnormal findings of blood chemistry: Secondary | ICD-10-CM | POA: Diagnosis not present

## 2018-07-05 DIAGNOSIS — R2681 Unsteadiness on feet: Secondary | ICD-10-CM | POA: Diagnosis not present

## 2018-07-05 DIAGNOSIS — J449 Chronic obstructive pulmonary disease, unspecified: Secondary | ICD-10-CM | POA: Diagnosis not present

## 2018-07-10 ENCOUNTER — Telehealth: Payer: Self-pay

## 2018-07-10 NOTE — Telephone Encounter (Signed)
He either needs to be seen and assessed or may need to be seen in ER. There are several meds on his list which he is no longer taking. He can stop meclizine, however, i'm not sure this is the problem. He should not be that sleepy from meclizine. Trazodone is the only one which would cause this that I can see. Is he taking seroquel?

## 2018-07-11 NOTE — Telephone Encounter (Signed)
Pt scheduled to come in on Friday morning 07/12/2018

## 2018-07-12 ENCOUNTER — Ambulatory Visit (INDEPENDENT_AMBULATORY_CARE_PROVIDER_SITE_OTHER): Payer: Medicare Other | Admitting: Nurse Practitioner

## 2018-07-12 ENCOUNTER — Encounter: Payer: Self-pay | Admitting: Nurse Practitioner

## 2018-07-12 ENCOUNTER — Other Ambulatory Visit
Admission: RE | Admit: 2018-07-12 | Discharge: 2018-07-12 | Disposition: A | Discharge status: Home / Self Care | Payer: Medicare Other | Attending: Nurse Practitioner | Admitting: Nurse Practitioner

## 2018-07-12 ENCOUNTER — Other Ambulatory Visit: Payer: Self-pay

## 2018-07-12 VITALS — BP 102/78 | HR 76 | Temp 99.0°F | Resp 16 | Ht 76.0 in | Wt 180.0 lb

## 2018-07-12 DIAGNOSIS — D509 Iron deficiency anemia, unspecified: Secondary | ICD-10-CM | POA: Diagnosis not present

## 2018-07-12 DIAGNOSIS — E079 Disorder of thyroid, unspecified: Secondary | ICD-10-CM | POA: Diagnosis not present

## 2018-07-12 DIAGNOSIS — M545 Low back pain, unspecified: Secondary | ICD-10-CM

## 2018-07-12 DIAGNOSIS — I6340 Cerebral infarction due to embolism of unspecified cerebral artery: Secondary | ICD-10-CM | POA: Diagnosis not present

## 2018-07-12 DIAGNOSIS — R5383 Other fatigue: Secondary | ICD-10-CM | POA: Insufficient documentation

## 2018-07-12 LAB — CBC
HCT: 40.5 % (ref 39.0–52.0)
Hemoglobin: 13.8 g/dL (ref 13.0–17.0)
MCH: 31.9 pg (ref 26.0–34.0)
MCHC: 34.1 g/dL (ref 30.0–36.0)
MCV: 93.5 fL (ref 80.0–100.0)
Platelets: 228 10*3/uL (ref 150–400)
RBC: 4.33 MIL/uL (ref 4.22–5.81)
RDW: 12.8 % (ref 11.5–15.5)
WBC: 6.9 10*3/uL (ref 4.0–10.5)
nRBC: 0 % (ref 0.0–0.2)

## 2018-07-12 LAB — TSH: TSH: 0.947 u[IU]/mL (ref 0.350–4.500)

## 2018-07-12 LAB — VITAMIN B12: Vitamin B-12: 435 pg/mL (ref 180–914)

## 2018-07-12 LAB — FERRITIN: Ferritin: 64 ng/mL (ref 24–336)

## 2018-07-12 LAB — FOLATE: Folate: 14.4 ng/mL (ref >5.9)

## 2018-07-12 LAB — T4, FREE: Free T4: 1.07 ng/dL (ref 0.61–1.12)

## 2018-07-12 MED ORDER — TRAMADOL HCL 50 MG PO TABS: 50.0000 mg | ORAL_TABLET | Freq: Two times a day (BID) | ORAL | 2 refills | Status: DC | PRN

## 2018-07-12 NOTE — Progress Notes (Signed)
[facility] [address] [place], [place] [address]  Internal MEDICINE  Office Visit Note  Patient Name: Gerald MageFred Herbert Martin  [id number]  [id number]  Date of Service: 07/14/2018   Pt is here for a sick visit.  Chief Complaint  Patient presents with  . Fatigue    staying sleepy and have dizziness      The patient is here for sick visit. States that he is staying sleepy all the time. Has been able to come off trazodone completely. He is concerned that some of the medications he is taking is causing him to feel so fatigued. He is also seeing endocrinologist due to thyroid nodules which were found while he was hospitalized earlier this year. He denies chest pain, chest pressure, or shortness of breath. Denies headache or unusual dizziness.        Current Medication:  Outpatient Encounter Medications as of 07/12/2018  Medication Sig Note  . amLODipine (NORVASC) 10 MG tablet Take 1 tablet (10 mg total) by mouth daily.   Marland Kitchen. aspirin 81 MG EC tablet Take 1 tablet (81 mg total) by mouth daily.   Marland Kitchen. atorvastatin (LIPITOR) 40 MG tablet Take 1 tablet (40 mg total) by mouth daily at 6 PM.   . carvedilol (COREG) 12.5 MG tablet TAKE 1 TABLET BY MOUTH TWICE DAILY WITH MEALS   . clopidogrel (PLAVIX) 75 MG tablet Take 1 tablet (75 mg total) by mouth daily.   . COMBIVENT RESPIMAT 20-100 MCG/ACT AERS respimat Inhale 1 puff into the lungs 4 (four) times daily.   . famotidine (PEPCID) 10 MG tablet Take 1 tablet (10 mg total) by mouth 2 (two) times daily.   Marland Kitchen. FLUoxetine (PROZAC) 40 MG capsule TAKE 1 CAPSULE BY MOUTH DAILY   . Ipratropium-Albuterol (COMBIVENT RESPIMAT) 20-100 MCG/ACT AERS respimat Inhale 1 puff into the lungs 4 (four) times daily.   Marland Kitchen. lisinopril (ZESTRIL) 40 MG tablet Take 40 mg by mouth daily.   . meclizine (ANTIVERT) 25 MG tablet Take 1 tablet (25 mg total) by mouth 3 (three) times daily as needed for dizziness.   . SPIRIVA HANDIHALER 18 MCG inhalation capsule Place 1  puff into inhaler and inhale daily.   Marland Kitchen. tiotropium (SPIRIVA) 18 MCG inhalation capsule Place 1 capsule (18 mcg total) into inhaler and inhale daily.   . traMADol (ULTRAM) 50 MG tablet Take 1 tablet (50 mg total) by mouth every 12 (twelve) hours as needed. for pain   . [DISCONTINUED] HYDROcodone-acetaminophen (NORCO) 5-325 MG tablet Take 1 tablet by mouth every 6 (six) hours as needed for up to 15 doses for severe pain.   . [DISCONTINUED] QUEtiapine (SEROQUEL) 100 MG tablet Take 1 tablet (100 mg total) by mouth at bedtime. 07/12/2018: patient not taking this due to fatigue  . [DISCONTINUED] traMADol (ULTRAM) 50 MG tablet Take 1 tablet (50 mg total) by mouth 3 (three) times daily as needed. for pain   . [DISCONTINUED] traZODone (DESYREL) 100 MG tablet Take 0.5 tablets (50 mg total) by mouth at bedtime. 07/12/2018: patient is too sleepy.    No facility-administered encounter medications on file as of 07/12/2018.       Medical History: Past Medical History:  Diagnosis Date  . Anxiety   . Chronic pain    back  . COPD (chronic obstructive pulmonary disease) (HCC)   . Depression   . GERD (gastroesophageal reflux disease)   . Hyperlipidemia   . Hypertension   . Stroke [facility](HCC)      Today's Vitals  07/12/18 0951  BP: 102/78  Pulse: 76  Resp: 16  Temp: 99 F (37.2 C)  TempSrc: Oral  SpO2: 96%  Weight: 180 lb (81.6 kg)  Height: 6\' 4"  (1.93 m)   Body mass index is 21.91 kg/m.  Review of Systems  Constitutional: Positive for activity change and fatigue. Negative for chills and unexpected weight change.  HENT: Negative for congestion, rhinorrhea, sneezing and sore throat.   Respiratory: Negative for cough, chest tightness, shortness of breath and wheezing.   Cardiovascular: Negative for chest pain and palpitations.  Gastrointestinal: Negative for abdominal pain, constipation, diarrhea, nausea and vomiting.  Endocrine: Negative for cold intolerance, heat intolerance, polydipsia and  polyuria.       He is now being followed by endocrinology due to nodules found on thyroid while in the hospital earlier this year.   Musculoskeletal: Positive for arthralgias and back pain. Negative for joint swelling and neck pain.  Skin: Negative for rash.  Allergic/Immunologic: Negative for environmental allergies.  Neurological: Positive for weakness. Negative for tremors, speech difficulty, numbness and headaches.  Hematological: Negative for adenopathy. Does not bruise/bleed easily.  Psychiatric/Behavioral: Positive for dysphoric mood and sleep disturbance. Negative for behavioral problems and suicidal ideas. The patient is not nervous/anxious.        Patient states he is no longer drinking or doing any illegal drugs. Mental health stable .    Physical Exam Vitals signs and nursing note reviewed.  Constitutional:      General: He is not in acute distress.    Appearance: Normal appearance. He is well-developed. He is not diaphoretic.  HENT:     Head: Normocephalic and atraumatic.     Mouth/Throat:     Pharynx: No oropharyngeal exudate.  Eyes:     Conjunctiva/sclera: Conjunctivae normal.     Pupils: Pupils are equal, round, and reactive to light.  Neck:     Musculoskeletal: Normal range of motion and neck supple.     Thyroid: No thyromegaly.     Vascular: No carotid bruit or JVD.     Trachea: No tracheal deviation.  Cardiovascular:     Rate and Rhythm: Normal rate and regular rhythm.     Heart sounds: Normal heart sounds. No murmur. No friction rub. No gallop.   Pulmonary:     Effort: Pulmonary effort is normal. No respiratory distress.     Breath sounds: Normal breath sounds. No wheezing or rales.  Chest:     Chest wall: No tenderness.  Abdominal:     General: Bowel sounds are normal.     Palpations: Abdomen is soft.     Tenderness: There is no abdominal tenderness. There is no guarding.  Musculoskeletal: Normal range of motion.     Comments: Patient using a walker to  assist his gait .  Lymphadenopathy:     Cervical: No cervical adenopathy.  Skin:    General: Skin is warm and dry.  Neurological:     Mental Status: He is alert and oriented to person, place, and time. Mental status is at baseline.     Cranial Nerves: No cranial nerve deficit.     Gait: Gait abnormal.     Comments: Continues using a walker to help with mobility.   Psychiatric:        Behavior: Behavior normal.        Thought Content: Thought content normal.        Judgment: Judgment normal.    Assessment/Plan:  1. Other fatigue Severe. Will check  labs, including anemia and thyroid panels. Consider sleep study if labs look good.   2. Low back pain, unspecified back pain laterality, unspecified chronicity, unspecified whether sciatica present Short term prescription for tramadol 50mg  tablets sent to pharmacy. Patient may take one tablet up to twice daily if needed for pain, as he cannot take NSAIDs due to blood thinning medication. Patient has a home health aid who will ensure that medication is being taken only as needed and no more than prescribed.  - traMADol (ULTRAM) 50 MG tablet; Take 1 tablet (50 mg total) by mouth every 12 (twelve) hours as needed. for pain  Dispense: 45 tablet; Refill: 2  3. Cerebrovascular accident (CVA) due to embolism of cerebral artery ([place]) Continues to recover. Has home health aid to assist with activities of daily living.   General Counseling: [name] understanding of the findings of todays visit and agrees with plan of treatment. I have discussed any further diagnostic evaluation that may be needed or ordered today. We also reviewed his medications today. he has been encouraged to call the office with any questions or concerns that should arise related to todays visit.    Counseling:  This patient was seen by [place] with Dr Lavera Guise as a part of collaborative care agreement  Meds ordered this encounter   Medications  . traMADol (ULTRAM) 50 MG tablet    Sig: Take 1 tablet (50 mg total) by mouth every 12 (twelve) hours as needed. for pain    Dispense:  45 tablet    Refill:  2    #60 take 1 tablet by mouth bid.    Order Specific Question:   Supervising Provider    Answer:   Lavera Guise [[id number]]    Time spent: 25 Minutes

## 2018-07-13 LAB — T3: T3, Total: 158 ng/dL (ref 71–180)

## 2018-07-14 DIAGNOSIS — R5383 Other fatigue: Secondary | ICD-10-CM | POA: Insufficient documentation

## 2018-07-19 ENCOUNTER — Other Ambulatory Visit: Payer: Self-pay | Admitting: Internal Medicine

## 2018-07-19 ENCOUNTER — Ambulatory Visit: Payer: Self-pay | Admitting: Nurse Practitioner

## 2018-07-19 DIAGNOSIS — J449 Chronic obstructive pulmonary disease, unspecified: Secondary | ICD-10-CM | POA: Diagnosis not present

## 2018-07-19 DIAGNOSIS — I69398 Other sequelae of cerebral infarction: Secondary | ICD-10-CM | POA: Diagnosis not present

## 2018-07-19 DIAGNOSIS — R2681 Unsteadiness on feet: Secondary | ICD-10-CM | POA: Diagnosis not present

## 2018-07-19 DIAGNOSIS — F17210 Nicotine dependence, cigarettes, uncomplicated: Secondary | ICD-10-CM | POA: Diagnosis not present

## 2018-07-19 DIAGNOSIS — I10 Essential (primary) hypertension: Secondary | ICD-10-CM | POA: Diagnosis not present

## 2018-07-19 DIAGNOSIS — R42 Dizziness and giddiness: Secondary | ICD-10-CM | POA: Diagnosis not present

## 2018-07-23 ENCOUNTER — Telehealth: Payer: Self-pay

## 2018-07-23 NOTE — Telephone Encounter (Signed)
Informed pt of results through caregiver.

## 2018-07-24 DIAGNOSIS — E041 Nontoxic single thyroid nodule: Secondary | ICD-10-CM | POA: Diagnosis not present

## 2018-07-29 ENCOUNTER — Other Ambulatory Visit: Payer: Self-pay

## 2018-07-29 ENCOUNTER — Ambulatory Visit (INDEPENDENT_AMBULATORY_CARE_PROVIDER_SITE_OTHER): Payer: Medicare Other | Admitting: Nurse Practitioner

## 2018-07-29 ENCOUNTER — Encounter: Payer: Self-pay | Admitting: Nurse Practitioner

## 2018-07-29 VITALS — BP 95/71 | HR 55 | Temp 97.6°F | Resp 16 | Ht 76.0 in | Wt 171.0 lb

## 2018-07-29 DIAGNOSIS — I10 Essential (primary) hypertension: Secondary | ICD-10-CM

## 2018-07-29 DIAGNOSIS — I6340 Cerebral infarction due to embolism of unspecified cerebral artery: Secondary | ICD-10-CM | POA: Diagnosis not present

## 2018-07-29 DIAGNOSIS — Z0001 Encounter for general adult medical examination with abnormal findings: Secondary | ICD-10-CM

## 2018-07-29 DIAGNOSIS — R5383 Other fatigue: Secondary | ICD-10-CM | POA: Diagnosis not present

## 2018-07-29 DIAGNOSIS — R300 Dysuria: Secondary | ICD-10-CM | POA: Diagnosis not present

## 2018-07-29 MED ORDER — CARVEDILOL 6.25 MG PO TABS: 6.2500 mg | ORAL_TABLET | Freq: Two times a day (BID) | ORAL | 2 refills | Status: DC

## 2018-07-29 NOTE — Progress Notes (Signed)
[facility] [place], [place] [address]  Internal MEDICINE  Office Visit Note  Patient Name: Gerald Martin  [id number]  [id number]  Date of Service: 07/29/2018   Pt is here for routine health maintenance examination  Chief Complaint  Patient presents with  . Medicare Wellness    medication refills  . Hypertension  . Hyperlipidemia  . Gastroesophageal Reflux     The patient is here for health maintenance exam. He continues staying sleepy all the time. Has been able to come off trazodone completely. He is concerned that some of the medications he is taking is causing him to feel so fatigued. Did have labs done after his most recent visit. All looked good. He had thyroid biopsy last week and is waiting on the results. Today, blood pressure is slightly low as is heart rate. He states that besides feeling sleepy, he is doing well.     Current Medication: Outpatient Encounter Medications as of 07/29/2018  Medication Sig  . amLODipine (NORVASC) 10 MG tablet Take 1 tablet (10 mg total) by mouth daily.  Marland Kitchen aspirin 81 MG EC tablet Take 1 tablet (81 mg total) by mouth daily.  Marland Kitchen atorvastatin (LIPITOR) 40 MG tablet Take 1 tablet (40 mg total) by mouth daily at 6 PM.  . carvedilol (COREG) 6.25 MG tablet Take 1 tablet (6.25 mg total) by mouth 2 (two) times daily with a meal.  . clopidogrel (PLAVIX) 75 MG tablet Take 1 tablet (75 mg total) by mouth daily.  . COMBIVENT RESPIMAT 20-100 MCG/ACT AERS respimat Inhale 1 puff into the lungs 4 (four) times daily.  . famotidine (PEPCID) 10 MG tablet Take 1 tablet (10 mg total) by mouth 2 (two) times daily.  Marland Kitchen FLUoxetine (PROZAC) 40 MG capsule TAKE 1 CAPSULE BY MOUTH DAILY  . Ipratropium-Albuterol (COMBIVENT RESPIMAT) 20-100 MCG/ACT AERS respimat Inhale 1 puff into the lungs 4 (four) times daily.  Marland Kitchen lisinopril (ZESTRIL) 40 MG tablet Take 40 mg by mouth daily.  . meclizine (ANTIVERT) 25 MG tablet Take 1 tablet (25 mg total) by  mouth 3 (three) times daily as needed for dizziness.  . SPIRIVA HANDIHALER 18 MCG inhalation capsule Place 1 puff into inhaler and inhale daily.  . traMADol (ULTRAM) 50 MG tablet Take 1 tablet (50 mg total) by mouth every 12 (twelve) hours as needed. for pain  . [DISCONTINUED] carvedilol (COREG) 12.5 MG tablet TAKE 1 TABLET BY MOUTH TWICE DAILY WITH MEALS  . [DISCONTINUED] SPIRIVA HANDIHALER 18 MCG inhalation capsule INHALE CONTENTS OF 1 CAPSULE ONCE DAILY (Patient not taking: Reported on 07/29/2018)   No facility-administered encounter medications on file as of 07/29/2018.     Surgical History: Past Surgical History:  Procedure Laterality Date  . arm surgery[Other] Left    chain saw injury  . chronic pain    . LEG SURGERY Right     Medical History: Past Medical History:  Diagnosis Date  . Anxiety   . Chronic pain    back  . COPD (chronic obstructive pulmonary disease) ([place])   . Depression   . GERD (gastroesophageal reflux disease)   . Hyperlipidemia   . Hypertension   . Stroke [facility])     Family History: Family History  Problem Relation Age of Onset  . Stroke Mother   . Diabetes Father       Review of Systems  Constitutional: Positive for activity change and fatigue. Negative for chills and unexpected weight change.  HENT: Negative for congestion, rhinorrhea,  sneezing and sore throat.   Respiratory: Negative for cough, chest tightness, shortness of breath and wheezing.   Cardiovascular: Negative for chest pain and palpitations.       Blood pressure running on the low side today   Gastrointestinal: Negative for abdominal pain, constipation, diarrhea, nausea and vomiting.  Endocrine: Negative for cold intolerance, heat intolerance, polydipsia and polyuria.       He is now being followed by endocrinology due to nodules found on thyroid while in the hospital earlier this year.   Musculoskeletal: Positive for arthralgias and back pain. Negative for joint swelling and neck  pain.  Skin: Negative for rash.  Allergic/Immunologic: Negative for environmental allergies.  Neurological: Positive for dizziness and weakness. Negative for tremors, speech difficulty, numbness and headaches.       Intermittent  Hematological: Negative for adenopathy. Does not bruise/bleed easily.  Psychiatric/Behavioral: Positive for dysphoric mood and sleep disturbance. Negative for behavioral problems and suicidal ideas. The patient is not nervous/anxious.        Patient states he is no longer drinking or doing any illegal drugs. Mental health stable .     Today's Vitals   07/29/18 1444  BP: 95/71  Pulse: (!) 55  Resp: 16  Temp: 97.6 F (36.4 C)  SpO2: 98%  Weight: 171 lb (77.6 kg)   Body mass index is 20.81 kg/m.  Physical Exam Vitals signs and nursing note reviewed.  Constitutional:      General: He is not in acute distress.    Appearance: Normal appearance. He is well-developed. He is not diaphoretic.  HENT:     Head: Normocephalic and atraumatic.     Mouth/Throat:     Pharynx: No oropharyngeal exudate.  Eyes:     Conjunctiva/sclera: Conjunctivae normal.     Pupils: Pupils are equal, round, and reactive to light.  Neck:     Musculoskeletal: Normal range of motion and neck supple.     Thyroid: No thyromegaly.     Vascular: No carotid bruit or JVD.     Trachea: No tracheal deviation.  Cardiovascular:     Rate and Rhythm: Normal rate and regular rhythm.     Heart sounds: Normal heart sounds. No murmur. No friction rub. No gallop.   Pulmonary:     Effort: Pulmonary effort is normal. No respiratory distress.     Breath sounds: Normal breath sounds. No wheezing or rales.  Chest:     Chest wall: No tenderness.  Abdominal:     General: Bowel sounds are normal.     Palpations: Abdomen is soft.     Tenderness: There is no abdominal tenderness. There is no guarding.  Musculoskeletal: Normal range of motion.     Comments: Patient using a walker to assist his gait  .mild/moderate low back pain which is worse with bending and twisting at the waist. No visible or palpable abnormalities noted today.   Lymphadenopathy:     Cervical: No cervical adenopathy.  Skin:    General: Skin is warm and dry.  Neurological:     Mental Status: He is alert and oriented to person, place, and time. Mental status is at baseline.     Cranial Nerves: No cranial nerve deficit.     Gait: Gait abnormal.     Comments: Continues using a walker to help with mobility.   Psychiatric:        Behavior: Behavior normal.        Thought Content: Thought content normal.  Judgment: Judgment normal.    Depression screen [facility] 2/9 07/29/2018 07/12/2018 06/07/2018 04/19/2018 03/08/2018  Decreased Interest 0 0 0 0 0  Down, Depressed, Hopeless 0 0 0 0 0  PHQ - 2 Score 0 0 0 0 0  Altered sleeping - - - - -  Tired, decreased energy - - - - -  Change in appetite - - - - -  Feeling bad or failure about yourself  - - - - -  Trouble concentrating - - - - -  Moving slowly or fidgety/restless - - - - -  Suicidal thoughts - - - - -  PHQ-9 Score - - - - -  Difficult doing work/chores - - - - -    Functional Status Survey: Is the patient deaf or have difficulty hearing?: No Does the patient have difficulty seeing, even when wearing glasses/contacts?: Yes(some blurred vision occasionally) Does the patient have difficulty concentrating, remembering, or making decisions?: (remembering) Does the patient have difficulty walking or climbing stairs?: Yes(pt use walker) Does the patient have difficulty dressing or bathing?: No Does the patient have difficulty doing errands alone such as visiting a doctor's office or shopping?: No  MMSE - Mini Mental State Exam 07/29/2018  Orientation to time 4  Orientation to Place 5  Registration 3  Attention/ Calculation 5  Recall 3  Language- name 2 objects 2  Language- repeat 1  Language- follow 3 step command 3  Language- read & follow direction 1  Write a  sentence 1  Copy design 1  Total score 29    Fall Risk  07/29/2018 07/29/2018 07/12/2018 07/12/2018 06/07/2018  Falls in the past year? 1 1 1  0 1  Number falls in past yr: 1 1 1  0 0  Comment - - - - hit head on corner of wall  Injury with Fall? 0 0 0 - 1       LABS: Recent Results (from the past 2160 hour(s))  Basic metabolic panel     Status: Abnormal   Collection Time: 05/31/18 11:58 AM  Result Value Ref Range   Sodium 140 135 - 145 mmol/L   Potassium 3.9 3.5 - 5.1 mmol/L   Chloride 106 98 - 111 mmol/L   CO2 25 22 - 32 mmol/L   Glucose, Bld 116 (H) 70 - 99 mg/dL   BUN 12 6 - 20 mg/dL   Creatinine, Ser 1.61 0.61 - 1.24 mg/dL   Calcium 9.1 8.9 - 09.6 mg/dL   GFR calc non Af Amer >60 >60 mL/min   GFR calc Af Amer >60 >60 mL/min   Anion gap 9 5 - 15    Comment: Performed at [facility], [address] [place], [place] [address]  CBC     Status: Abnormal   Collection Time: 05/31/18 11:58 AM  Result Value Ref Range   WBC 10.9 (H) 4.0 - 10.5 K/uL   RBC 4.95 4.22 - 5.81 MIL/uL   Hemoglobin 15.5 13.0 - 17.0 g/dL   HCT 98.1 19.1 - 47.8 %   MCV 92.7 80.0 - 100.0 fL   MCH 31.3 26.0 - 34.0 pg   MCHC 33.8 30.0 - 36.0 g/dL   RDW 29.5 62.1 - 30.8 %   Platelets 261 150 - 400 K/uL   nRBC 0.0 0.0 - 0.2 %    Comment: Performed at [facility], [address] [place], [place] [address]  Procalcitonin     Status: None   Collection Time: 05/31/18 11:58 AM  Result Value Ref Range   Procalcitonin <0.10 ng/mL    Comment:        Interpretation: PCT (Procalcitonin) <= 0.5 ng/mL: Systemic infection (sepsis) is not likely. Local bacterial infection is possible. (NOTE)       Sepsis PCT Algorithm           Lower Respiratory Tract                                      Infection PCT Algorithm    ----------------------------     ----------------------------         PCT < 0.25 ng/mL                PCT < 0.10 ng/mL         Strongly encourage             Strongly discourage    discontinuation of antibiotics    initiation of antibiotics    ----------------------------     -----------------------------       PCT 0.25 - 0.50 ng/mL            PCT 0.10 - 0.25 ng/mL               OR       >80% decrease in PCT            Discourage initiation of                                            antibiotics      Encourage discontinuation           of antibiotics    ----------------------------     -----------------------------         PCT >= 0.50 ng/mL              PCT 0.26 - 0.50 ng/mL               AND        <80% decrease in PCT             Encourage initiation of                                             antibiotics       Encourage continuation           of antibiotics    ----------------------------     -----------------------------        PCT >= 0.50 ng/mL                  PCT > 0.50 ng/mL               AND         increase in PCT                  Strongly encourage                                      initiation of antibiotics    Strongly encourage escalation  of antibiotics                                     -----------------------------                                           PCT <= 0.25 ng/mL                                                 OR                                        > 80% decrease in PCT                                     Discontinue / Do not initiate                                             antibiotics Performed at [facility], [address] [place], [place] [address]   Hepatic function panel     Status: None   Collection Time: 05/31/18 11:58 AM  Result Value Ref Range   Total Protein 7.0 6.5 - 8.1 g/dL   Albumin 3.6 3.5 - 5.0 g/dL   AST 23 15 - 41 U/L   ALT 35 0 - 44 U/L   Alkaline Phosphatase 64 38 - 126 U/L   Total Bilirubin 0.8 0.3 - 1.2 mg/dL   Bilirubin, Direct 0.2 0.0 - 0.2 mg/dL   Indirect Bilirubin 0.6 0.3 - 0.9 mg/dL    Comment: Performed at [facility], [address]  [place], [place] [address]  CBC with Differential/Platelet     Status: Abnormal   Collection Time: 05/31/18 11:58 AM  Result Value Ref Range   WBC 10.6 (H) 4.0 - 10.5 K/uL   RBC 4.95 4.22 - 5.81 MIL/uL   Hemoglobin 15.6 13.0 - 17.0 g/dL   HCT 09.8 11.9 - 14.7 %   MCV 93.1 80.0 - 100.0 fL   MCH 31.5 26.0 - 34.0 pg   MCHC 33.8 30.0 - 36.0 g/dL   RDW 82.9 56.2 - 13.0 %   Platelets 272 150 - 400 K/uL   nRBC 0.0 0.0 - 0.2 %   Neutrophils Relative % 88 %   Neutro Abs 9.3 (H) 1.7 - 7.7 K/uL   Lymphocytes Relative 7 %   Lymphs Abs 0.8 0.7 - 4.0 K/uL   Monocytes Relative 4 %   Monocytes Absolute 0.4 0.1 - 1.0 K/uL   Eosinophils Relative 1 %   Eosinophils Absolute 0.1 0.0 - 0.5 K/uL   Basophils Relative 0 %   Basophils Absolute 0.0 0.0 - 0.1 K/uL   Immature Granulocytes 0 %   Abs Immature Granulocytes 0.03 0.00 - 0.07 K/uL    Comment: Performed at [facility], [address], [place], [place] [address]  Urinalysis, Complete  w Microscopic     Status: Abnormal   Collection Time: 05/31/18 12:37 PM  Result Value Ref Range   Color, Urine YELLOW (A) YELLOW   APPearance CLEAR (A) CLEAR   Specific Gravity, Urine 1.015 1.005 - 1.030   pH 6.0 5.0 - 8.0   Glucose, UA NEGATIVE NEGATIVE mg/dL   Hgb urine dipstick NEGATIVE NEGATIVE   Bilirubin Urine NEGATIVE NEGATIVE   Ketones, ur 5 (A) NEGATIVE mg/dL   Protein, ur NEGATIVE NEGATIVE mg/dL   Nitrite NEGATIVE NEGATIVE   Leukocytes,Ua NEGATIVE NEGATIVE   RBC / HPF 0-5 0 - 5 RBC/hpf   WBC, UA 0-5 0 - 5 WBC/hpf   Bacteria, UA NONE SEEN NONE SEEN   Squamous Epithelial / LPF NONE SEEN 0 - 5   Mucus PRESENT     Comment: Performed at [facility], [address] [place], [place] [address]  Lactic acid, plasma     Status: None   Collection Time: 05/31/18 12:37 PM  Result Value Ref Range   Lactic Acid, Venous 1.1 0.5 - 1.9 mmol/L    Comment: Performed at [facility], [address] [place], [place] [address]  Blood  Culture (routine x 2)     Status: None   Collection Time: 05/31/18 12:37 PM   Specimen: BLOOD  Result Value Ref Range   Specimen Description BLOOD RIGHT ANTECUBITAL    Special Requests      BOTTLES DRAWN AEROBIC AND ANAEROBIC Blood Culture adequate volume   Culture      NO GROWTH 5 DAYS Performed at [facility], [address] [place], [place] [address]    Report Status 06/05/2018 FINAL   Blood Culture (routine x 2)     Status: None   Collection Time: 05/31/18 12:38 PM   Specimen: BLOOD  Result Value Ref Range   Specimen Description BLOOD BLOOD RIGHT FOREARM    Special Requests      BOTTLES DRAWN AEROBIC AND ANAEROBIC Blood Culture results may not be optimal due to an inadequate volume of blood received in culture bottles   Culture      NO GROWTH 5 DAYS Performed at [facility], [address] [place], [place] [address]    Report Status 06/05/2018 FINAL   SARS Coronavirus 2 (CEPHEID- Performed in [facility]), Hosp Order     Status: None   Collection Time: 05/31/18 12:38 PM   Specimen: Nasopharyngeal Swab  Result Value Ref Range   SARS Coronavirus 2 NEGATIVE NEGATIVE    Comment: (NOTE) If result is NEGATIVE SARS-CoV-2 target nucleic acids are NOT DETECTED. The SARS-CoV-2 RNA is generally detectable in upper and lower  respiratory specimens during the acute phase of infection. The lowest  concentration of SARS-CoV-2 viral copies this assay can detect is 250  copies / mL. A negative result does not preclude SARS-CoV-2 infection  and should not be used as the sole basis for treatment or other  patient management decisions.  A negative result may occur with  improper specimen collection / handling, submission of specimen other  than nasopharyngeal swab, presence of viral mutation(s) within the  areas targeted by this assay, and inadequate number of viral copies  (<250 copies / mL). A negative result must be combined with clinical   observations, patient history, and epidemiological information. If result is POSITIVE SARS-CoV-2 target nucleic acids are DETECTED. The SARS-CoV-2 RNA is generally detectable in upper and lower  respiratory specimens dur ing the acute phase of  infection.  Positive  results are indicative of active infection with SARS-CoV-2.  Clinical  correlation with patient history and other diagnostic information is  necessary to determine patient infection status.  Positive results do  not rule out bacterial infection or co-infection with other viruses. If result is PRESUMPTIVE POSTIVE SARS-CoV-2 nucleic acids MAY BE PRESENT.   A presumptive positive result was obtained on the submitted specimen  and confirmed on repeat testing.  While 2019 novel coronavirus  (SARS-CoV-2) nucleic acids may be present in the submitted sample  additional confirmatory testing may be necessary for epidemiological  and / or clinical management purposes  to differentiate between  SARS-CoV-2 and other Sarbecovirus currently known to infect humans.  If clinically indicated additional testing with an alternate test  methodology [phone]) is advised. The SARS-CoV-2 RNA is generally  detectable in upper and lower respiratory sp ecimens during the acute  phase of infection. The expected result is Negative. Fact Sheet for Patients:  BoilerBrush.com.cy Fact Sheet for Healthcare Providers: https://pope.com/ This test is not yet approved or cleared by the [place] FDA and has been authorized for detection and/or diagnosis of SARS-CoV-2 by FDA under an Emergency Use Authorization (EUA).  This EUA will remain in effect (meaning this test can be used) for the duration of the COVID-19 declaration under Section 564(b)(1) of the Act, 21 U.S.C. section 360bbb-3(b)(1), unless the authorization is terminated or revoked sooner. Performed at [facility], [address], [place], [place] [address]   Urine culture     Status: None   Collection Time: 05/31/18  1:20 PM   Specimen: Urine, Random  Result Value Ref Range   Specimen Description      URINE, RANDOM Performed at [facility], [address], [place], [place] [address]    Special Requests      NONE Performed at [facility], [address], [place], [place] [address]    Culture      NO GROWTH Performed at [facility], [address] [place], [place] [address]    Report Status 06/01/2018 FINAL   Urine Drug Screen, Qualitative     Status: None   Collection Time: 05/31/18  1:20 PM  Result Value Ref Range   Tricyclic, Ur Screen NONE DETECTED NONE DETECTED   Amphetamines, Ur Screen NONE DETECTED NONE DETECTED   MDMA (Ecstasy)Ur Screen NONE DETECTED NONE DETECTED   Cocaine Metabolite,Ur [place] NONE DETECTED NONE DETECTED   Opiate, Ur Screen NONE DETECTED NONE DETECTED   Phencyclidine (PCP) Ur S NONE DETECTED NONE DETECTED   Cannabinoid 50 Ng, Ur [place] NONE DETECTED NONE DETECTED   Barbiturates, Ur Screen NONE DETECTED NONE DETECTED   Benzodiazepine, Ur Scrn NONE DETECTED NONE DETECTED   Methadone Scn, Ur NONE DETECTED NONE DETECTED    Comment: (NOTE) Tricyclics + metabolites, urine    Cutoff 1000 ng/mL Amphetamines + metabolites, urine  Cutoff 1000 ng/mL MDMA (Ecstasy), urine              Cutoff 500 ng/mL Cocaine Metabolite, urine          Cutoff 300 ng/mL Opiate + metabolites, urine        Cutoff 300 ng/mL Phencyclidine (PCP), urine         Cutoff 25 ng/mL Cannabinoid, urine                 Cutoff 50 ng/mL Barbiturates + metabolites, urine  Cutoff 200 ng/mL Benzodiazepine, urine  Cutoff 200 ng/mL Methadone, urine                   Cutoff 300 ng/mL The urine drug screen provides only a preliminary, unconfirmed analytical test result and should not be used for non-medical purposes. Clinical consideration and professional judgment should be applied to any  positive drug screen result due to possible interfering substances. A more specific alternate chemical method must be used in order to obtain a confirmed analytical result. Gas chromatography / mass spectrometry (GC/MS) is the preferred confirmat ory method. Performed at [facility], [address] [place], [place] [address]   Ethanol     Status: None   Collection Time: 05/31/18  2:25 PM  Result Value Ref Range   Alcohol, Ethyl (B) <10 <10 mg/dL    Comment: (NOTE) Lowest detectable limit for serum alcohol is 10 mg/dL. For medical purposes only. Performed at [facility], [address] [place], [place] [address]   Troponin I - Add-On to previous collection     Status: None   Collection Time: 05/31/18  2:25 PM  Result Value Ref Range   Troponin I <0.03 <0.03 ng/mL    Comment: Performed at [facility], [address] [place], [place] [address]  Influenza panel by PCR (type A & B)     Status: None   Collection Time: 05/31/18  3:35 PM  Result Value Ref Range   Influenza A By PCR NEGATIVE NEGATIVE   Influenza B By PCR NEGATIVE NEGATIVE    Comment: (NOTE) The Xpert Xpress Flu assay is intended as an aid in the diagnosis of  influenza and should not be used as a sole basis for treatment.  This  assay is FDA approved for nasopharyngeal swab specimens only. Nasal  washings and aspirates are unacceptable for Xpert Xpress Flu testing. Performed at [facility], [address] [place], [place] [address]   HIV antibody (Routine Testing)     Status: None   Collection Time: 05/31/18  6:40 PM  Result Value Ref Range   HIV Screen 4th Generation wRfx Non Reactive Non Reactive    Comment: (NOTE) Performed At: [facility] [address] [place], [place] [id number] Jolene Schimke MD MV:[id number]   Troponin I - Now Then Q6H     Status: None   Collection Time: 05/31/18  6:40 PM  Result Value Ref Range   Troponin I <0.03 <0.03 ng/mL     Comment: Performed at [facility], [address] [place], [place] [address]  Troponin I - Now Then Q6H     Status: None   Collection Time: 06/01/18 12:52 AM  Result Value Ref Range   Troponin I <0.03 <0.03 ng/mL    Comment: Performed at [facility], [address] [place], [place] [address]  Basic metabolic panel     Status: Abnormal   Collection Time: 06/01/18 12:52 AM  Result Value Ref Range   Sodium 137 135 - 145 mmol/L   Potassium 4.1 3.5 - 5.1 mmol/L   Chloride 105 98 - 111 mmol/L   CO2 25 22 - 32 mmol/L   Glucose, Bld 175 (H) 70 - 99 mg/dL   BUN 14 6 - 20 mg/dL   Creatinine, Ser 2.72 0.61 - 1.24 mg/dL   Calcium 8.3 (L) 8.9 - 10.3 mg/dL   GFR calc non Af Amer >60 >60 mL/min   GFR calc Af Amer >60 >60 mL/min   Anion gap 7 5 -  15    Comment: Performed at [facility], [address] [place], [place] [address]  CBC     Status: None   Collection Time: 06/01/18 12:52 AM  Result Value Ref Range   WBC 7.8 4.0 - 10.5 K/uL   RBC 4.72 4.22 - 5.81 MIL/uL   Hemoglobin 14.7 13.0 - 17.0 g/dL   HCT 29.5 62.1 - 30.8 %   MCV 92.2 80.0 - 100.0 fL   MCH 31.1 26.0 - 34.0 pg   MCHC 33.8 30.0 - 36.0 g/dL   RDW 65.7 84.6 - 96.2 %   Platelets 250 150 - 400 K/uL   nRBC 0.0 0.0 - 0.2 %    Comment: Performed at [facility], [address] [place], [place] [address]  TSH     Status: Abnormal   Collection Time: 06/01/18 12:52 AM  Result Value Ref Range   TSH 0.301 (L) 0.350 - 4.500 uIU/mL    Comment: Performed by a 3rd Generation assay with a functional sensitivity of <=0.01 uIU/mL. Performed at [facility], [address] [place], [place] [address]   T4, free     Status: None   Collection Time: 06/01/18 12:52 AM  Result Value Ref Range   Free T4 1.00 0.82 - 1.77 ng/dL    Comment: (NOTE) Biotin ingestion may interfere with free T4 tests. If the results are inconsistent with the TSH level, previous test results, or the clinical  presentation, then consider biotin interference. If needed, order repeat testing after stopping biotin. Performed at [facility], [address] [place], [place] [address]   Hemoglobin A1c     Status: None   Collection Time: 06/01/18 12:52 AM  Result Value Ref Range   Hgb A1c MFr Bld 5.6 4.8 - 5.6 %    Comment: (NOTE) Pre diabetes:          5.7%-6.4% Diabetes:              >6.4% Glycemic control for   <7.0% adults with diabetes    Mean Plasma Glucose 114.02 mg/dL    Comment: Performed at [facility], [address] [place], [place] [address]  Lipid panel     Status: None   Collection Time: 06/01/18 12:52 AM  Result Value Ref Range   Cholesterol 144 0 - 200 mg/dL   Triglycerides 39 <440 mg/dL   HDL 45 >34 mg/dL   Total CHOL/HDL Ratio 3.2 RATIO   VLDL 8 0 - 40 mg/dL   LDL Cholesterol 91 0 - 99 mg/dL    Comment:        Total Cholesterol/HDL:CHD Risk Coronary Heart Disease Risk Table                     Men   Women  1/2 Average Risk   3.4   3.3  Average Risk       5.0   4.4  2 X Average Risk   9.6   7.1  3 X Average Risk  23.4   11.0        Use the calculated Patient Ratio above and the CHD Risk Table to determine the patient's CHD Risk.        ATP III CLASSIFICATION (LDL):  <100     mg/dL   Optimal  742-595  mg/dL   Near or Above  Optimal  130-159  mg/dL   Borderline  161-096  mg/dL   High  >045     mg/dL   Very High Performed at [facility], [address] [place], [place] [address]   Troponin I - Now Then Q6H     Status: None   Collection Time: 06/01/18  6:48 AM  Result Value Ref Range   Troponin I <0.03 <0.03 ng/mL    Comment: Performed at [facility], [address] [place], [place] [address]  T3, Free     Status: Abnormal   Collection Time: 06/01/18  6:52 AM  Result Value Ref Range   T3, Free 1.9 (L) 2.0 - 4.4 pg/mL    Comment: (NOTE) Performed At: [facility] [address]  [place], [place] [id number] Jolene Schimke MD [id number]:[id number]   ECHOCARDIOGRAM COMPLETE     Status: None   Collection Time: 06/02/18  1:21 PM  Result Value Ref Range   Weight 2,638.4 oz   Height 76 in   BP 146/96 mmHg  CBC     Status: None   Collection Time: 07/12/18 11:37 AM  Result Value Ref Range   WBC 6.9 4.0 - 10.5 K/uL   RBC 4.33 4.22 - 5.81 MIL/uL   Hemoglobin 13.8 13.0 - 17.0 g/dL   HCT 28.4 13.2 - 44.0 %   MCV 93.5 80.0 - 100.0 fL   MCH 31.9 26.0 - 34.0 pg   MCHC 34.1 30.0 - 36.0 g/dL   RDW 10.2 72.5 - 36.6 %   Platelets 228 150 - 400 K/uL   nRBC 0.0 0.0 - 0.2 %    Comment: Performed at [facility], [address] [place], [place] [address]  Vitamin B12     Status: None   Collection Time: 07/12/18 11:37 AM  Result Value Ref Range   Vitamin B-12 435 180 - 914 pg/mL    Comment: (NOTE) This assay is not validated for testing neonatal or myeloproliferative syndrome specimens for Vitamin B12 levels. Performed at [facility], [address], [place], [place] [address]   Folate     Status: None   Collection Time: 07/12/18 11:37 AM  Result Value Ref Range   Folate 14.4 >5.9 ng/mL    Comment: Performed at [facility], [address] [place], [place] [address]  Ferritin     Status: None   Collection Time: 07/12/18 11:37 AM  Result Value Ref Range   Ferritin 64 24 - 336 ng/mL    Comment: Performed at [facility], [address] [place], [place] [address]  TSH     Status: None   Collection Time: 07/12/18 11:37 AM  Result Value Ref Range   TSH 0.947 0.350 - 4.500 uIU/mL    Comment: Performed by a 3rd Generation assay with a functional sensitivity of <=0.01 uIU/mL. Performed at [facility], [address] [place], [place] [address]   T3     Status: None   Collection Time: 07/12/18 11:37 AM  Result Value Ref Range   T3, Total 158 71 - 180 ng/dL    Comment: (NOTE) Performed At: [facility] [address]  [place], [place] [id number] Jolene Schimke MD [id number]   T4, free     Status: None   Collection Time: 07/12/18 11:37 AM  Result Value Ref Range   Free T4 1.07 0.61 - 1.12 ng/dL    Comment: (NOTE) Biotin ingestion may interfere with free T4 tests. If the  results are inconsistent with the TSH level, previous test results, or the clinical presentation, then consider biotin interference. If needed, order repeat testing after stopping biotin. Performed at [facility], [address] [place], [place] [address]    Assessment/Plan: 1. Encounter for general adult medical examination with abnormal findings Annual health maintenance exam today.   2. Essential hypertension Mild hypotension with bradycardia present. Reduce coreg to 6.25mg  twice daily. Continue norvasc as prescribed  - carvedilol (COREG) 6.25 MG tablet; Take 1 tablet (6.25 mg total) by mouth 2 (two) times daily with a meal.  Dispense: 60 tablet; Refill: 2  3. Cerebrovascular accident (CVA) due to embolism of cerebral artery (HCC) Continue plavix as prescribed   4. Other fatigue Reviewed labs which looked good. Continue plavix as prescribed. Monitor closely.   5. Dysuria - UA/M w/rflx Culture, Routine  General Counseling: Armany verbalizes understanding of the findings of todays visit and agrees with plan of treatment. I have discussed any further diagnostic evaluation that may be needed or ordered today. We also reviewed his medications today. he has been encouraged to call the office with any questions or concerns that should arise related to todays visit.    Counseling:  Cardiac risk factor modification:  1. Control blood pressure. 2. Exercise as prescribed. 3. Follow low sodium, low fat diet. and low fat and low cholestrol diet. 4. Take ASA  once a day. 5. Restricted calories diet to lose weight.  This patient was seen by Vincent Gros FNP Collaboration with Dr Lyndon Code as a part of  collaborative care agreement  Orders Placed This Encounter  Procedures  . UA/M w/rflx Culture, Routine    Meds ordered this encounter  Medications  . carvedilol (COREG) 6.25 MG tablet    Sig: Take 1 tablet (6.25 mg total) by mouth 2 (two) times daily with a meal.    Dispense:  60 tablet    Refill:  2    Please note decreased dose    Order Specific Question:   Supervising Provider    Answer:   Lyndon Code [[id number]]    Time spent: 21 Minutes      Lyndon Code, MD  Internal Medicine

## 2018-07-30 LAB — UA/M W/RFLX CULTURE, ROUTINE
Bilirubin, UA: NEGATIVE
Glucose, UA: NEGATIVE
Leukocytes,UA: NEGATIVE
Nitrite, UA: NEGATIVE
Protein,UA: NEGATIVE
RBC, UA: NEGATIVE
Specific Gravity, UA: 1.015 (ref 1.005–1.030)
Urobilinogen, Ur: 1 mg/dL (ref 0.2–1.0)
pH, UA: 6 (ref 5.0–7.5)

## 2018-07-30 LAB — MICROSCOPIC EXAMINATION: Bacteria, UA: NONE SEEN

## 2018-08-01 DIAGNOSIS — J449 Chronic obstructive pulmonary disease, unspecified: Secondary | ICD-10-CM | POA: Diagnosis not present

## 2018-08-01 DIAGNOSIS — R2681 Unsteadiness on feet: Secondary | ICD-10-CM | POA: Diagnosis not present

## 2018-08-01 DIAGNOSIS — I69398 Other sequelae of cerebral infarction: Secondary | ICD-10-CM | POA: Diagnosis not present

## 2018-08-01 DIAGNOSIS — F17210 Nicotine dependence, cigarettes, uncomplicated: Secondary | ICD-10-CM | POA: Diagnosis not present

## 2018-08-01 DIAGNOSIS — I10 Essential (primary) hypertension: Secondary | ICD-10-CM | POA: Diagnosis not present

## 2018-08-01 DIAGNOSIS — R42 Dizziness and giddiness: Secondary | ICD-10-CM | POA: Diagnosis not present

## 2018-08-26 ENCOUNTER — Other Ambulatory Visit: Payer: Self-pay | Admitting: Internal Medicine

## 2018-08-26 DIAGNOSIS — I161 Hypertensive emergency: Secondary | ICD-10-CM

## 2018-08-27 ENCOUNTER — Telehealth: Payer: Self-pay

## 2018-08-27 ENCOUNTER — Other Ambulatory Visit: Payer: Self-pay

## 2018-08-27 MED ORDER — OMEPRAZOLE 20 MG PO CPDR: 20.0000 mg | DELAYED_RELEASE_CAPSULE | Freq: Every day | ORAL | 1 refills | Status: DC

## 2018-08-27 NOTE — Telephone Encounter (Signed)
[facility] called that famotidine is backorder as per dr Humphrey Rolls change to Prilosec 20 mg once a daily

## 2018-08-27 NOTE — Telephone Encounter (Signed)
Spoke with pt caregiver we change famotidine to prilosec due to backorder

## 2018-09-12 ENCOUNTER — Ambulatory Visit: Payer: Medicare Other | Admitting: Nurse Practitioner

## 2018-11-05 ENCOUNTER — Ambulatory Visit (INDEPENDENT_AMBULATORY_CARE_PROVIDER_SITE_OTHER): Payer: Medicare Other | Admitting: Nurse Practitioner

## 2018-11-05 ENCOUNTER — Other Ambulatory Visit: Payer: Self-pay

## 2018-11-05 ENCOUNTER — Encounter: Payer: Self-pay | Admitting: Nurse Practitioner

## 2018-11-05 VITALS — BP 120/81 | HR 71 | Temp 97.6°F | Resp 16 | Ht 76.0 in | Wt 173.0 lb

## 2018-11-05 DIAGNOSIS — M545 Low back pain, unspecified: Secondary | ICD-10-CM

## 2018-11-05 DIAGNOSIS — R300 Dysuria: Secondary | ICD-10-CM

## 2018-11-05 DIAGNOSIS — R5383 Other fatigue: Secondary | ICD-10-CM

## 2018-11-05 LAB — POCT URINALYSIS DIPSTICK
Bilirubin, UA: POSITIVE
Blood, UA: NEGATIVE
Glucose, UA: NEGATIVE
Ketones, UA: POSITIVE
Leukocytes, UA: NEGATIVE
Nitrite, UA: NEGATIVE
Protein, UA: POSITIVE — AB
Spec Grav, UA: 1.02 (ref 1.010–1.025)
Urobilinogen, UA: 0.2 E.U./dL
pH, UA: 6 (ref 5.0–8.0)

## 2018-11-05 NOTE — Progress Notes (Signed)
[facility] [address] [place], [place] [id number]  Internal MEDICINE  Office Visit Note  Patient Name: Gerald MageFred Herbert Martin  [id number]  [id number]  Date of Service: 11/17/2018   Pt is here for a sick visit.  Chief Complaint  Patient presents with  . Fatigue  . Back Pain    lower back pain   . Leg Pain    tingling in both legs     The patient is here for acute visit. He continues to have moderate fatigue. He is also complaining of low back pain which Is worse with exertion. He does have a prescription for tramadol which he takes rarely. He had labs checked after his last visit. They were all within normal limits. Urine sample showing protein and ketones without evidence of infection today.        Current Medication:  Outpatient Encounter Medications as of 11/05/2018  Medication Sig  . amLODipine (NORVASC) 10 MG tablet TAKE 1 TABLET BY MOUTH ONCE DAILY  . COMBIVENT RESPIMAT 20-100 MCG/ACT AERS respimat Inhale 1 puff into the lungs 4 (four) times daily.  Marland Kitchen. FLUoxetine (PROZAC) 40 MG capsule TAKE 1 CAPSULE BY MOUTH DAILY  . Ipratropium-Albuterol (COMBIVENT RESPIMAT) 20-100 MCG/ACT AERS respimat Inhale 1 puff into the lungs 4 (four) times daily.  Marland Kitchen. lisinopril (ZESTRIL) 40 MG tablet TAKE 1 TABLET BY MOUTH ONCE DAILY  . meclizine (ANTIVERT) 25 MG tablet Take 1 tablet (25 mg total) by mouth 3 (three) times daily as needed for dizziness.  Marland Kitchen. omeprazole (PRILOSEC) 20 MG capsule Take 1 capsule (20 mg total) by mouth daily.  Marland Kitchen. SPIRIVA HANDIHALER 18 MCG inhalation capsule Place 1 puff into inhaler and inhale daily.  . traMADol (ULTRAM) 50 MG tablet Take 1 tablet (50 mg total) by mouth every 12 (twelve) hours as needed. for pain  . [DISCONTINUED] aspirin 81 MG EC tablet Take 1 tablet (81 mg total) by mouth daily.  . [DISCONTINUED] atorvastatin (LIPITOR) 40 MG tablet Take 1 tablet (40 mg total) by mouth daily at 6 PM.  . [DISCONTINUED] carvedilol (COREG) 6.25 MG tablet  Take 1 tablet (6.25 mg total) by mouth 2 (two) times daily with a meal.  . [DISCONTINUED] clopidogrel (PLAVIX) 75 MG tablet Take 1 tablet (75 mg total) by mouth daily.   No facility-administered encounter medications on file as of 11/05/2018.       Medical History: Past Medical History:  Diagnosis Date  . Anxiety   . Chronic pain    back  . COPD (chronic obstructive pulmonary disease) (HCC)   . Depression   . GERD (gastroesophageal reflux disease)   . Hyperlipidemia   . Hypertension   . Stroke (HCC)     Today's Vitals   11/05/18 1515  BP: 120/81  Pulse: 71  Resp: 16  Temp: 97.6 F (36.4 C)  SpO2: 98%  Weight: 173 lb (78.5 kg)  Height: 6\' 4"  (1.93 m)   Body mass index is 21.06 kg/m.  Review of Systems  Constitutional: Positive for activity change and fatigue. Negative for chills and unexpected weight change.  HENT: Negative for congestion, rhinorrhea, sneezing and sore throat.   Respiratory: Negative for cough, chest tightness, shortness of breath and wheezing.   Cardiovascular: Negative for chest pain and palpitations.  Gastrointestinal: Negative for abdominal pain, constipation, diarrhea, nausea and vomiting.  Endocrine: Negative for cold intolerance, heat intolerance, polydipsia and polyuria.       He is now being followed by endocrinology due to nodules found on  thyroid while in the hospital earlier this year.   Genitourinary: Positive for flank pain.  Musculoskeletal: Positive for arthralgias and back pain. Negative for joint swelling and neck pain.  Skin: Negative for rash.  Allergic/Immunologic: Negative for environmental allergies.  Neurological: Positive for dizziness and weakness. Negative for tremors, speech difficulty, numbness and headaches.       Intermittent  Hematological: Negative for adenopathy. Does not bruise/bleed easily.  Psychiatric/Behavioral: Positive for dysphoric mood and sleep disturbance. Negative for behavioral problems and suicidal  ideas. The patient is not nervous/anxious.        Patient states he is no longer drinking or doing any illegal drugs. Mental health stable .    Physical Exam Vitals signs and nursing note reviewed.  Constitutional:      General: He is not in acute distress.    Appearance: Normal appearance. He is well-developed. He is not diaphoretic.  HENT:     Head: Normocephalic and atraumatic.     Mouth/Throat:     Pharynx: No oropharyngeal exudate.  Eyes:     Conjunctiva/sclera: Conjunctivae normal.     Pupils: Pupils are equal, round, and reactive to light.  Neck:     Musculoskeletal: Normal range of motion and neck supple.     Thyroid: No thyromegaly.     Vascular: No carotid bruit or JVD.     Trachea: No tracheal deviation.  Cardiovascular:     Rate and Rhythm: Normal rate and regular rhythm.     Heart sounds: Normal heart sounds. No murmur. No friction rub. No gallop.   Pulmonary:     Effort: Pulmonary effort is normal. No respiratory distress.     Breath sounds: Normal breath sounds. No wheezing or rales.  Chest:     Chest wall: No tenderness.  Abdominal:     General: Bowel sounds are normal.     Palpations: Abdomen is soft.     Tenderness: There is no abdominal tenderness. There is no guarding.  Genitourinary:    Comments: Urine sample is positive for protein and ketones.  Musculoskeletal: Normal range of motion.     Comments: Patient using a walker to assist his gait .mild/moderate low back pain which is worse with bending and twisting at the waist. No visible or palpable abnormalities noted today.   Lymphadenopathy:     Cervical: No cervical adenopathy.  Skin:    General: Skin is warm and dry.  Neurological:     Mental Status: He is alert and oriented to person, place, and time. Mental status is at baseline.     Cranial Nerves: No cranial nerve deficit.     Gait: Gait abnormal.     Comments: Continues using a walker to help with mobility.   Psychiatric:        Behavior:  Behavior normal.        Thought Content: Thought content normal.        Judgment: Judgment normal.   Assessment/Plan: 1. Other fatigue Reviewed recent labs with the patient, all within normal limits. Fatigue is possibly due to thyroid disease. He is being followed per endocrinology   2. Low back pain, unspecified back pain laterality, unspecified chronicity, unspecified whether sciatica present Advised he take tylenol as needed and as indicated for mild to moderate pain. May use prescribed tramadol as needed for more severe pain   3. Dysuria Urine sample showing protein and ketones. Encouraged him to increase fluid intake. Will send urine for culture and sensitivity and adjust antibiotics  as indicated.  - POCT Urinalysis Dipstick - CULTURE, URINE COMPREHENSIVE  General Counseling: Anvith verbalizes understanding of the findings of todays visit and agrees with plan of treatment. I have discussed any further diagnostic evaluation that may be needed or ordered today. We also reviewed his medications today. he has been encouraged to call the office with any questions or concerns that should arise related to todays visit.    Counseling:  This patient was seen by Leretha Pol FNP Collaboration with Dr Lavera Guise as a part of collaborative care agreement  Orders Placed This Encounter  Procedures  . CULTURE, URINE COMPREHENSIVE  . POCT Urinalysis Dipstick      Time spent: 25 Minutes

## 2018-11-07 NOTE — Progress Notes (Signed)
Waiting for culture results. Will treat as indicated .

## 2018-11-08 LAB — CULTURE, URINE COMPREHENSIVE

## 2018-11-12 ENCOUNTER — Other Ambulatory Visit: Payer: Self-pay

## 2018-11-12 DIAGNOSIS — E782 Mixed hyperlipidemia: Secondary | ICD-10-CM

## 2018-11-12 DIAGNOSIS — I6340 Cerebral infarction due to embolism of unspecified cerebral artery: Secondary | ICD-10-CM

## 2018-11-12 DIAGNOSIS — I10 Essential (primary) hypertension: Secondary | ICD-10-CM

## 2018-11-12 MED ORDER — ATORVASTATIN CALCIUM 40 MG PO TABS: 40.0000 mg | ORAL_TABLET | Freq: Every day | ORAL | 3 refills | Status: DC

## 2018-11-12 MED ORDER — ASPIRIN 81 MG PO TBEC: 81.0000 mg | DELAYED_RELEASE_TABLET | Freq: Every day | ORAL | 3 refills | Status: DC

## 2018-11-12 MED ORDER — CARVEDILOL 6.25 MG PO TABS: 6.2500 mg | ORAL_TABLET | Freq: Two times a day (BID) | ORAL | 2 refills | Status: DC

## 2018-11-12 MED ORDER — CLOPIDOGREL BISULFATE 75 MG PO TABS: 75.0000 mg | ORAL_TABLET | Freq: Every day | ORAL | 3 refills | Status: DC

## 2018-11-13 ENCOUNTER — Other Ambulatory Visit: Payer: Self-pay | Admitting: Nurse Practitioner

## 2018-11-14 LAB — BASIC METABOLIC PANEL
BUN/Creatinine Ratio: 15 (ref 9–20)
BUN: 18 mg/dL (ref 6–24)
CO2: 23 mmol/L (ref 20–29)
Calcium: 9.5 mg/dL (ref 8.7–10.2)
Chloride: 101 mmol/L (ref 96–106)
Creatinine, Ser: 1.2 mg/dL (ref 0.76–1.27)
GFR calc Af Amer: 80 mL/min/{1.73_m2} (ref >59)
GFR calc non Af Amer: 69 mL/min/{1.73_m2} (ref >59)
Glucose: 98 mg/dL (ref 65–99)
Potassium: 4.9 mmol/L (ref 3.5–5.2)
Sodium: 138 mmol/L (ref 134–144)

## 2018-11-14 LAB — CBC
Hematocrit: 41.9 % (ref 37.5–51.0)
Hemoglobin: 14.4 g/dL (ref 13.0–17.7)
MCH: 32.1 pg (ref 26.6–33.0)
MCHC: 34.4 g/dL (ref 31.5–35.7)
MCV: 94 fL (ref 79–97)
Platelets: 336 10*3/uL (ref 150–450)
RBC: 4.48 x10E6/uL (ref 4.14–5.80)
RDW: 12.3 % (ref 11.6–15.4)
WBC: 7.7 10*3/uL (ref 3.4–10.8)

## 2018-11-14 LAB — HGB A1C W/O EAG: Hgb A1c MFr Bld: 5.2 % (ref 4.8–5.6)

## 2018-11-19 ENCOUNTER — Telehealth: Payer: Self-pay

## 2018-11-19 ENCOUNTER — Telehealth: Payer: Self-pay | Admitting: Nurse Practitioner

## 2018-11-19 NOTE — Telephone Encounter (Signed)
His most recent lab work is completely normal. If he continues to act this way, they may consider admission to hospital.

## 2018-11-19 NOTE — Telephone Encounter (Signed)
CASE COMMUNICATION REPORT SIGNED BY PROVIDER AND FAXED BACK TO [facility].

## 2018-11-19 NOTE — Progress Notes (Signed)
Normal labs.

## 2018-12-13 ENCOUNTER — Other Ambulatory Visit: Payer: Self-pay

## 2018-12-13 MED ORDER — LISINOPRIL 40 MG PO TABS: 40.0000 mg | ORAL_TABLET | Freq: Every day | ORAL | 1 refills | Status: DC

## 2019-01-21 ENCOUNTER — Other Ambulatory Visit: Payer: Self-pay | Admitting: Nurse Practitioner

## 2019-01-21 DIAGNOSIS — M545 Low back pain, unspecified: Secondary | ICD-10-CM

## 2019-01-21 MED ORDER — TRAMADOL HCL 50 MG PO TABS: 50.0000 mg | ORAL_TABLET | Freq: Two times a day (BID) | ORAL | 0 refills | Status: DC | PRN

## 2019-01-21 NOTE — Progress Notes (Signed)
Renewed tramadol 50mg  which may be takn twice daily as needed for pain. Single prescription for #45 tablets sent to his pharmacy

## 2019-02-05 ENCOUNTER — Telehealth: Payer: Self-pay

## 2019-02-05 NOTE — Telephone Encounter (Signed)
CONFIRMED 02-07-19 OV AS VIRTUAL. 

## 2019-02-07 ENCOUNTER — Other Ambulatory Visit: Payer: Self-pay

## 2019-02-07 ENCOUNTER — Ambulatory Visit (INDEPENDENT_AMBULATORY_CARE_PROVIDER_SITE_OTHER): Payer: Medicare Other | Admitting: Nurse Practitioner

## 2019-02-07 ENCOUNTER — Encounter: Payer: Self-pay | Admitting: Nurse Practitioner

## 2019-02-07 VITALS — Ht 76.0 in

## 2019-02-07 DIAGNOSIS — R5383 Other fatigue: Secondary | ICD-10-CM | POA: Diagnosis not present

## 2019-02-07 DIAGNOSIS — F5102 Adjustment insomnia: Secondary | ICD-10-CM

## 2019-02-07 DIAGNOSIS — M545 Low back pain, unspecified: Secondary | ICD-10-CM

## 2019-02-07 DIAGNOSIS — E042 Nontoxic multinodular goiter: Secondary | ICD-10-CM
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
## 2019-03-01 ENCOUNTER — Other Ambulatory Visit: Payer: Self-pay | Admitting: Internal Medicine

## 2019-03-03 ENCOUNTER — Other Ambulatory Visit: Payer: Self-pay

## 2019-03-03 DIAGNOSIS — I6340 Cerebral infarction due to embolism of unspecified cerebral artery: Secondary | ICD-10-CM

## 2019-03-03 DIAGNOSIS — E782 Mixed hyperlipidemia: Secondary | ICD-10-CM

## 2019-03-03 DIAGNOSIS — I10 Essential (primary) hypertension: Secondary | ICD-10-CM

## 2019-03-03 MED ORDER — CARVEDILOL 6.25 MG PO TABS: 6.2500 mg | ORAL_TABLET | Freq: Two times a day (BID) | ORAL | 2 refills | Status: DC

## 2019-03-03 MED ORDER — ASPIRIN 81 MG PO TBEC: 81.0000 mg | DELAYED_RELEASE_TABLET | Freq: Every day | ORAL | 3 refills | Status: DC

## 2019-03-03 MED ORDER — CLOPIDOGREL BISULFATE 75 MG PO TABS: 75.0000 mg | ORAL_TABLET | Freq: Every day | ORAL | 3 refills | Status: DC

## 2019-03-03 MED ORDER — ATORVASTATIN CALCIUM 40 MG PO TABS: 40.0000 mg | ORAL_TABLET | Freq: Every day | ORAL | 3 refills | Status: DC

## 2019-03-10 ENCOUNTER — Other Ambulatory Visit: Payer: Self-pay

## 2019-03-10 DIAGNOSIS — M545 Low back pain, unspecified: Secondary | ICD-10-CM

## 2019-03-12 MED ORDER — TRAMADOL HCL 50 MG PO TABS: 50.0000 mg | ORAL_TABLET | Freq: Two times a day (BID) | ORAL | 0 refills | Status: DC | PRN

## 2019-04-15 ENCOUNTER — Other Ambulatory Visit: Payer: Self-pay

## 2019-04-15 DIAGNOSIS — F332 Major depressive disorder, recurrent severe without psychotic features: Secondary | ICD-10-CM

## 2019-04-15 MED ORDER — FLUOXETINE HCL 40 MG PO CAPS: ORAL_CAPSULE | ORAL | 0 refills | Status: DC

## 2019-04-30 ENCOUNTER — Emergency Department
Admission: EM | Admit: 2019-04-30 | Discharge: 2019-04-30 | Disposition: A | Discharge status: Other Acute Inpatient Hospital | Payer: Medicare HMO | Attending: Emergency Medicine | Admitting: Emergency Medicine

## 2019-04-30 ENCOUNTER — Emergency Department: Payer: Medicare HMO

## 2019-04-30 ENCOUNTER — Encounter: Payer: Self-pay | Admitting: Emergency Medicine

## 2019-04-30 ENCOUNTER — Other Ambulatory Visit: Payer: Self-pay

## 2019-04-30 DIAGNOSIS — T22212A Burn of second degree of left forearm, initial encounter: Secondary | ICD-10-CM | POA: Diagnosis not present

## 2019-04-30 DIAGNOSIS — X010XXA Exposure to flames in uncontrolled fire, not in building or structure, initial encounter: Secondary | ICD-10-CM | POA: Diagnosis not present

## 2019-04-30 DIAGNOSIS — K219 Gastro-esophageal reflux disease without esophagitis: Secondary | ICD-10-CM | POA: Insufficient documentation

## 2019-04-30 DIAGNOSIS — Z7901 Long term (current) use of anticoagulants: Secondary | ICD-10-CM | POA: Insufficient documentation

## 2019-04-30 DIAGNOSIS — I639 Cerebral infarction, unspecified: Secondary | ICD-10-CM | POA: Insufficient documentation

## 2019-04-30 DIAGNOSIS — K8020 Calculus of gallbladder without cholecystitis without obstruction: Secondary | ICD-10-CM | POA: Diagnosis not present

## 2019-04-30 DIAGNOSIS — T3110 Burns involving 10-19% of body surface with 0% to 9% third degree burns: Secondary | ICD-10-CM | POA: Insufficient documentation

## 2019-04-30 DIAGNOSIS — Y999 Unspecified external cause status: Secondary | ICD-10-CM | POA: Diagnosis not present

## 2019-04-30 DIAGNOSIS — T22211A Burn of second degree of right forearm, initial encounter: Secondary | ICD-10-CM | POA: Insufficient documentation

## 2019-04-30 DIAGNOSIS — Z20822 Contact with and (suspected) exposure to covid-19: Secondary | ICD-10-CM | POA: Diagnosis not present

## 2019-04-30 DIAGNOSIS — Y9389 Activity, other specified: Secondary | ICD-10-CM | POA: Diagnosis not present

## 2019-04-30 DIAGNOSIS — Z7982 Long term (current) use of aspirin: Secondary | ICD-10-CM | POA: Insufficient documentation

## 2019-04-30 DIAGNOSIS — E871 Hypo-osmolality and hyponatremia: Secondary | ICD-10-CM | POA: Diagnosis not present

## 2019-04-30 DIAGNOSIS — Y929 Unspecified place or not applicable: Secondary | ICD-10-CM | POA: Diagnosis not present

## 2019-04-30 DIAGNOSIS — T2109XA Burn of unspecified degree of other site of trunk, initial encounter: Secondary | ICD-10-CM | POA: Diagnosis present

## 2019-04-30 DIAGNOSIS — T23232A Burn of second degree of multiple left fingers (nail), not including thumb, initial encounter: Secondary | ICD-10-CM | POA: Insufficient documentation

## 2019-04-30 DIAGNOSIS — T300 Burn of unspecified body region, unspecified degree: Secondary | ICD-10-CM

## 2019-04-30 DIAGNOSIS — Z79899 Other long term (current) drug therapy: Secondary | ICD-10-CM | POA: Insufficient documentation

## 2019-04-30 DIAGNOSIS — F17210 Nicotine dependence, cigarettes, uncomplicated: Secondary | ICD-10-CM | POA: Diagnosis not present

## 2019-04-30 DIAGNOSIS — I10 Essential (primary) hypertension: Secondary | ICD-10-CM | POA: Insufficient documentation

## 2019-04-30 DIAGNOSIS — J449 Chronic obstructive pulmonary disease, unspecified: Secondary | ICD-10-CM | POA: Insufficient documentation

## 2019-04-30 DIAGNOSIS — T2129XA Burn of second degree of other site of trunk, initial encounter: Secondary | ICD-10-CM | POA: Diagnosis not present

## 2019-04-30 DIAGNOSIS — T23231A Burn of second degree of multiple right fingers (nail), not including thumb, initial encounter: Secondary | ICD-10-CM | POA: Insufficient documentation

## 2019-04-30 LAB — CBC
HCT: 42 % (ref 39.0–52.0)
Hemoglobin: 14.6 g/dL (ref 13.0–17.0)
MCH: 32.2 pg (ref 26.0–34.0)
MCHC: 34.8 g/dL (ref 30.0–36.0)
MCV: 92.5 fL (ref 80.0–100.0)
Platelets: 279 K/uL (ref 150–400)
RBC: 4.54 MIL/uL (ref 4.22–5.81)
RDW: 13.5 % (ref 11.5–15.5)
WBC: 15.5 K/uL — ABNORMAL HIGH (ref 4.0–10.5)
nRBC: 0 % (ref 0.0–0.2)

## 2019-04-30 LAB — COMPREHENSIVE METABOLIC PANEL WITH GFR
ALT: 27 U/L (ref 0–44)
AST: 31 U/L (ref 15–41)
Albumin: 3.3 g/dL — ABNORMAL LOW (ref 3.5–5.0)
Alkaline Phosphatase: 74 U/L (ref 38–126)
Anion gap: 9 (ref 5–15)
BUN: 28 mg/dL — ABNORMAL HIGH (ref 6–20)
CO2: 23 mmol/L (ref 22–32)
Calcium: 7.9 mg/dL — ABNORMAL LOW (ref 8.9–10.3)
Chloride: 95 mmol/L — ABNORMAL LOW (ref 98–111)
Creatinine, Ser: 1.32 mg/dL — ABNORMAL HIGH (ref 0.61–1.24)
GFR calc Af Amer: >60 mL/min
GFR calc non Af Amer: >60 mL/min
Glucose, Bld: 145 mg/dL — ABNORMAL HIGH (ref 70–99)
Potassium: 4 mmol/L (ref 3.5–5.1)
Sodium: 127 mmol/L — ABNORMAL LOW (ref 135–145)
Total Bilirubin: 1.5 mg/dL — ABNORMAL HIGH (ref 0.3–1.2)
Total Protein: 6.4 g/dL — ABNORMAL LOW (ref 6.5–8.1)

## 2019-04-30 LAB — ETHANOL: Alcohol, Ethyl (B): <10 mg/dL

## 2019-04-30 LAB — LIPASE, BLOOD: Lipase: 23 U/L (ref 11–51)

## 2019-04-30 LAB — POC SARS CORONAVIRUS 2 AG: SARS Coronavirus 2 Ag: NEGATIVE

## 2019-04-30 MED ORDER — IOHEXOL 300 MG/ML  SOLN
100.0000 mL | Freq: Once | INTRAMUSCULAR | Status: AC | PRN
  Administered 2019-04-30: 100 mL via INTRAVENOUS

## 2019-04-30 MED ORDER — LACTATED RINGERS IV BOLUS
1000.0000 mL | Freq: Once | INTRAVENOUS | Status: AC
  Administered 2019-04-30: 1000 mL via INTRAVENOUS

## 2019-04-30 MED ORDER — HYDROCODONE-ACETAMINOPHEN 5-325 MG PO TABS
2.0000 | ORAL_TABLET | Freq: Once | ORAL | Status: AC
  Administered 2019-04-30: 2 via ORAL
  Filled 2019-04-30: qty 2
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
## 2019-04-30 NOTE — ED Notes (Signed)
Pt placed on the sterile drape. 4x4 gauze and wrap bilateral hands and back
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
## 2019-04-30 NOTE — ED Notes (Signed)
Pt ambulatory to the restroom with walker without assistance. Pt also given a meal tray and water with approval from Dr. Fanny Bien, MD

## 2019-04-30 NOTE — ED Notes (Signed)
Pt transported to CT ?
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
## 2019-04-30 NOTE — ED Notes (Signed)
[facility] at bedside to help pt dress his wounds at this time.
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
## 2019-04-30 NOTE — ED Notes (Signed)
[facility] called waiting for a bed assignment per [facility]  1600

## 2019-04-30 NOTE — ED Notes (Signed)
[employer] [phone]

## 2019-04-30 NOTE — ED Notes (Signed)
Images powershared by CT to [facility] hospital  1746

## 2019-05-01 LAB — SARS CORONAVIRUS 2 (TAT 6-24 HRS): SARS Coronavirus 2: NEGATIVE

## 2019-05-06 ENCOUNTER — Telehealth: Payer: Self-pay

## 2019-05-06 NOTE — Telephone Encounter (Signed)
Confirmed and screened for 05-08-19 ov. 

## 2019-05-08 ENCOUNTER — Other Ambulatory Visit: Payer: Self-pay

## 2019-05-08 ENCOUNTER — Ambulatory Visit (INDEPENDENT_AMBULATORY_CARE_PROVIDER_SITE_OTHER): Payer: Medicare HMO | Admitting: Nurse Practitioner

## 2019-05-08 ENCOUNTER — Telehealth: Payer: Self-pay

## 2019-05-08 ENCOUNTER — Encounter: Payer: Self-pay | Admitting: Nurse Practitioner

## 2019-05-08 VITALS — BP 115/68 | HR 77 | Resp 16 | Ht 76.0 in | Wt 189.0 lb

## 2019-05-08 DIAGNOSIS — Z09 Encounter for follow-up examination after completed treatment for conditions other than malignant neoplasm: Secondary | ICD-10-CM | POA: Diagnosis not present

## 2019-05-08 DIAGNOSIS — T300 Burn of unspecified body region, unspecified degree: Secondary | ICD-10-CM

## 2019-05-08 DIAGNOSIS — R4189 Other symptoms and signs involving cognitive functions and awareness: Secondary | ICD-10-CM | POA: Insufficient documentation

## 2019-05-08 DIAGNOSIS — M545 Low back pain, unspecified: Secondary | ICD-10-CM

## 2019-05-08 MED ORDER — TRAMADOL HCL 50 MG PO TABS: 50.0000 mg | ORAL_TABLET | Freq: Two times a day (BID) | ORAL | 0 refills | Status: DC | PRN

## 2019-05-08 NOTE — Telephone Encounter (Signed)
Spoke with [facility] for home health referral he is going to check and send me message back

## 2019-05-08 NOTE — Telephone Encounter (Signed)
Spoke [facility] about home health
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
## 2019-05-16 ENCOUNTER — Telehealth: Payer: Self-pay

## 2019-05-16 NOTE — Telephone Encounter (Signed)
lmom to [facility] [phone] that we are returning call

## 2019-05-17 ENCOUNTER — Emergency Department: Payer: Medicare HMO

## 2019-05-17 ENCOUNTER — Encounter: Payer: Self-pay | Admitting: Emergency Medicine

## 2019-05-17 ENCOUNTER — Emergency Department
Admission: EM | Admit: 2019-05-17 | Discharge: 2019-05-17 | Disposition: A | Discharge status: Home / Self Care | Payer: Medicare HMO | Attending: Emergency Medicine | Admitting: Emergency Medicine

## 2019-05-17 ENCOUNTER — Other Ambulatory Visit: Payer: Self-pay

## 2019-05-17 DIAGNOSIS — Z8673 Personal history of transient ischemic attack (TIA), and cerebral infarction without residual deficits: Secondary | ICD-10-CM | POA: Insufficient documentation

## 2019-05-17 DIAGNOSIS — I10 Essential (primary) hypertension: Secondary | ICD-10-CM | POA: Diagnosis not present

## 2019-05-17 DIAGNOSIS — J449 Chronic obstructive pulmonary disease, unspecified: Secondary | ICD-10-CM | POA: Diagnosis not present

## 2019-05-17 DIAGNOSIS — S92901A Unspecified fracture of right foot, initial encounter for closed fracture: Secondary | ICD-10-CM

## 2019-05-17 DIAGNOSIS — S91311A Laceration without foreign body, right foot, initial encounter: Secondary | ICD-10-CM | POA: Diagnosis not present

## 2019-05-17 DIAGNOSIS — Y939 Activity, unspecified: Secondary | ICD-10-CM | POA: Insufficient documentation

## 2019-05-17 DIAGNOSIS — Y92010 Kitchen of single-family (private) house as the place of occurrence of the external cause: Secondary | ICD-10-CM | POA: Diagnosis not present

## 2019-05-17 DIAGNOSIS — W208XXA Other cause of strike by thrown, projected or falling object, initial encounter: Secondary | ICD-10-CM | POA: Insufficient documentation

## 2019-05-17 DIAGNOSIS — Y999 Unspecified external cause status: Secondary | ICD-10-CM | POA: Insufficient documentation

## 2019-05-17 DIAGNOSIS — Z79899 Other long term (current) drug therapy: Secondary | ICD-10-CM | POA: Diagnosis not present

## 2019-05-17 DIAGNOSIS — F17210 Nicotine dependence, cigarettes, uncomplicated: Secondary | ICD-10-CM | POA: Diagnosis not present

## 2019-05-17 DIAGNOSIS — Z7982 Long term (current) use of aspirin: Secondary | ICD-10-CM | POA: Diagnosis not present

## 2019-05-17 MED ORDER — BACITRACIN-NEOMYCIN-POLYMYXIN 400-5-5000 EX OINT
TOPICAL_OINTMENT | Freq: Once | CUTANEOUS | Status: AC
  Administered 2019-05-17: 1 via TOPICAL
  Filled 2019-05-17: qty 1

## 2019-05-17 MED ORDER — OXYCODONE-ACETAMINOPHEN 5-325 MG PO TABS
1.0000 | ORAL_TABLET | Freq: Once | ORAL | Status: AC
  Administered 2019-05-17: 1 via ORAL
  Filled 2019-05-17: qty 1

## 2019-05-17 MED ORDER — SILVER SULFADIAZINE 1 % EX CREA: TOPICAL_CREAM | CUTANEOUS | 1 refills | Status: AC

## 2019-05-17 MED ORDER — SULFAMETHOXAZOLE-TRIMETHOPRIM 800-160 MG PO TABS: 1.0000 | ORAL_TABLET | Freq: Two times a day (BID) | ORAL | 0 refills | Status: DC

## 2019-05-17 MED ORDER — LIDOCAINE-EPINEPHRINE (PF) 2 %-1:200000 IJ SOLN
10.0000 mL | Freq: Once | INTRAMUSCULAR | Status: AC
  Administered 2019-05-17: 10 mL
  Filled 2019-05-17: qty 10

## 2019-05-17 NOTE — Discharge Instructions (Addendum)
Follow discharge care instructions.  Continue previous pain medication.  Start antibiotics as directed.  Ambulate with crutches.
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
## 2019-05-17 NOTE — ED Notes (Signed)
Cleaned pt's right foot with normal saline and sterile dressing applied. Dressing covered with hospital sock.
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
## 2019-05-22 ENCOUNTER — Telehealth: Payer: Self-pay

## 2019-05-22 NOTE — Telephone Encounter (Signed)
WRITTEN ORDER FOR WOUND CARE SIGNED AND FAXED BACK TO [facility]. PLACED IN SCAN.

## 2019-05-23 ENCOUNTER — Other Ambulatory Visit: Payer: Self-pay | Admitting: Physician Assistant

## 2019-05-29 ENCOUNTER — Telehealth: Payer: Self-pay

## 2019-05-29 NOTE — Telephone Encounter (Signed)
Order for wound care supplies signed and faxed back to [facility]. Placed in scan.

## 2019-06-03 ENCOUNTER — Telehealth: Payer: Self-pay

## 2019-06-05 NOTE — Telephone Encounter (Signed)
Ok. Thank you.

## 2019-06-10 ENCOUNTER — Telehealth: Payer: Self-pay

## 2019-06-10 NOTE — Telephone Encounter (Signed)
Written order for wound care signed and faxed back to [facility] at [phone].

## 2019-06-25 ENCOUNTER — Other Ambulatory Visit: Payer: Self-pay

## 2019-06-25 DIAGNOSIS — I10 Essential (primary) hypertension: Secondary | ICD-10-CM

## 2019-06-25 MED ORDER — CARVEDILOL 6.25 MG PO TABS: 6.2500 mg | ORAL_TABLET | Freq: Two times a day (BID) | ORAL | 2 refills | Status: DC

## 2019-06-26 ENCOUNTER — Other Ambulatory Visit: Payer: Self-pay

## 2019-06-26 DIAGNOSIS — I161 Hypertensive emergency: Secondary | ICD-10-CM

## 2019-06-26 MED ORDER — AMLODIPINE BESYLATE 10 MG PO TABS: 10.0000 mg | ORAL_TABLET | Freq: Every day | ORAL | 1 refills | Status: DC

## 2019-06-26 MED ORDER — LISINOPRIL 40 MG PO TABS: 40.0000 mg | ORAL_TABLET | Freq: Every day | ORAL | 1 refills | Status: DC

## 2019-06-27 ENCOUNTER — Other Ambulatory Visit: Payer: Self-pay

## 2019-06-27 DIAGNOSIS — F332 Major depressive disorder, recurrent severe without psychotic features: Secondary | ICD-10-CM

## 2019-06-27 MED ORDER — FLUOXETINE HCL 40 MG PO CAPS: ORAL_CAPSULE | ORAL | 0 refills | Status: DC

## 2019-07-11 DIAGNOSIS — T22211D Burn of second degree of right forearm, subsequent encounter: Secondary | ICD-10-CM | POA: Insufficient documentation

## 2019-07-11 DIAGNOSIS — T2120XA Burn of second degree of trunk, unspecified site, initial encounter: Secondary | ICD-10-CM | POA: Insufficient documentation

## 2019-07-11 DIAGNOSIS — T23261A Burn of second degree of back of right hand, initial encounter: Secondary | ICD-10-CM | POA: Insufficient documentation

## 2019-07-21 ENCOUNTER — Ambulatory Visit (INDEPENDENT_AMBULATORY_CARE_PROVIDER_SITE_OTHER): Payer: Medicare HMO

## 2019-07-21 ENCOUNTER — Ambulatory Visit (INDEPENDENT_AMBULATORY_CARE_PROVIDER_SITE_OTHER): Payer: Medicare HMO | Admitting: Vascular Surgery

## 2019-07-21 ENCOUNTER — Encounter (INDEPENDENT_AMBULATORY_CARE_PROVIDER_SITE_OTHER): Payer: Self-pay | Admitting: Vascular Surgery

## 2019-07-21 ENCOUNTER — Other Ambulatory Visit: Payer: Self-pay

## 2019-07-21 ENCOUNTER — Other Ambulatory Visit (INDEPENDENT_AMBULATORY_CARE_PROVIDER_SITE_OTHER): Payer: Self-pay | Admitting: Vascular Surgery

## 2019-07-21 VITALS — BP 133/89 | HR 75 | Ht 76.0 in | Wt 186.0 lb

## 2019-07-21 DIAGNOSIS — I739 Peripheral vascular disease, unspecified: Secondary | ICD-10-CM

## 2019-07-21 DIAGNOSIS — K219 Gastro-esophageal reflux disease without esophagitis: Secondary | ICD-10-CM | POA: Diagnosis not present

## 2019-07-21 DIAGNOSIS — I10 Essential (primary) hypertension: Secondary | ICD-10-CM

## 2019-07-21 DIAGNOSIS — E782 Mixed hyperlipidemia: Secondary | ICD-10-CM | POA: Diagnosis not present

## 2019-07-21 DIAGNOSIS — I6340 Cerebral infarction due to embolism of unspecified cerebral artery: Secondary | ICD-10-CM | POA: Diagnosis not present

## 2019-07-21 NOTE — Progress Notes (Signed)
MRN : [id number]  Gerald Martin is a 54 y.o. ([birth year]) male who presents with chief complaint of  Chief Complaint  Patient presents with  . New Patient (Initial Visit)    PVD  abi consult   .  History of Present Illness:    The patient is seen for evaluation of diminished pulses. Patient notes slow healing of a right foot wound but he states it has now healed and he has been DC from Podiatry.  The patient denies rest pain or dangling of an extremity off the side of the bed during the night for relief. No open wounds or sores at this time. No prior interventions or surgeries.  No history of back problems or DJD of the lumbar sacral spine.   The patient denies changes in claudication symptoms or new rest pain symptoms.  No new ulcers or wounds of the foot.  The patient's blood pressure has been stable and relatively well controlled.  The patient denies amaurosis fugax or recent TIA symptoms. There are no recent neurological changes noted. The patient denies history of DVT, PE or superficial thrombophlebitis.  The patient denies recent episodes of angina or shortness of breath.   Current Meds  Medication Sig  . amLODipine (NORVASC) 10 MG tablet Take 1 tablet (10 mg total) by mouth daily.  Marland Kitchen aspirin 81 MG EC tablet Take 1 tablet (81 mg total) by mouth daily.  Marland Kitchen atorvastatin (LIPITOR) 40 MG tablet Take 1 tablet (40 mg total) by mouth daily at 6 PM.  . carvedilol (COREG) 6.25 MG tablet Take 1 tablet (6.25 mg total) by mouth 2 (two) times daily with a meal.  . clopidogrel (PLAVIX) 75 MG tablet Take 1 tablet (75 mg total) by mouth daily.  . COMBIVENT RESPIMAT 20-100 MCG/ACT AERS respimat Inhale 1 puff into the lungs 4 (four) times daily.  Marland Kitchen FLUoxetine (PROZAC) 40 MG capsule TAKE 1 CAPSULE BY MOUTH DAILY  . Ipratropium-Albuterol (COMBIVENT RESPIMAT) 20-100 MCG/ACT AERS respimat Inhale 1 puff into the lungs 4 (four) times daily.  Marland Kitchen lisinopril (ZESTRIL) 40 MG tablet Take 1  tablet (40 mg total) by mouth daily.  . meclizine (ANTIVERT) 25 MG tablet Take 1 tablet (25 mg total) by mouth 3 (three) times daily as needed for dizziness.  Marland Kitchen omeprazole (PRILOSEC) 20 MG capsule TAKE 1 CAPSULE BY MOUTH ONCE DAILY  . silver sulfADIAZINE (SILVADENE) 1 % cream Apply to affected area daily  . SPIRIVA HANDIHALER 18 MCG inhalation capsule Place 1 puff into inhaler and inhale daily.  Marland Kitchen sulfamethoxazole-trimethoprim (BACTRIM DS) 800-160 MG tablet Take 1 tablet by mouth 2 (two) times daily.  . traMADol (ULTRAM) 50 MG tablet Take 1 tablet (50 mg total) by mouth every 12 (twelve) hours as needed. for pain    Past Medical History:  Diagnosis Date  . Anxiety   . Chronic pain    back  . COPD (chronic obstructive pulmonary disease) ([place])   . Depression   . GERD (gastroesophageal reflux disease)   . Hyperlipidemia   . Hypertension   . Stroke [facility])     Past Surgical History:  Procedure Laterality Date  . arm surgery[Other] Left    chain saw injury  . chronic pain    . LEG SURGERY Right     Social History Social History   Tobacco Use  . Smoking status: Current Every Day Smoker    Packs/day: 1.00    Years: 30.00    Pack years: 30.00  . Smokeless  tobacco: Never Used  Vaping Use  . Vaping Use: Never used  Substance Use Topics  . Alcohol use: Not Currently  . Drug use: Never    Family History Family History  Problem Relation Age of Onset  . Stroke Mother   . Diabetes Father   No family history of bleeding/clotting disorders, porphyria or autoimmune disease   Allergies  Allergen Reactions  . Penicillins Other (See Comments)     REVIEW OF SYSTEMS (Negative unless checked)  Constitutional: [] Weight loss  [] Fever  [] Chills Cardiac: [] Chest pain   [] Chest pressure   [] Palpitations   [] Shortness of breath when laying flat   [] Shortness of breath with exertion. Vascular:  [] Pain in legs with walking   [] Pain in legs at rest  [] History of DVT   [] Phlebitis    [] Swelling in legs   [] Varicose veins   [] Non-healing ulcers Pulmonary:   [] Uses home oxygen   [] Productive cough   [] Hemoptysis   [] Wheeze  [] COPD   [] Asthma Neurologic:  [] Dizziness   [] Seizures   [x] History of stroke   [] History of TIA  [] Aphasia   [] Vissual changes   [] Weakness or numbness in arm   [] Weakness or numbness in leg Musculoskeletal:   [] Joint swelling   [] Joint pain   [] Low back pain Hematologic:  [] Easy bruising  [] Easy bleeding   [] Hypercoagulable state   [] Anemic Gastrointestinal:  [] Diarrhea   [] Vomiting  [] Gastroesophageal reflux/heartburn   [] Difficulty swallowing. Genitourinary:  [] Chronic kidney disease   [] Difficult urination  [] Frequent urination   [] Blood in urine Skin:  [] Rashes   [] Ulcers  Psychological:  [] History of anxiety   []  History of major depression.  Physical Examination  Vitals:   07/21/19 1443  BP: 133/89  Pulse: 75  Weight: 186 lb (84.4 kg)  Height: 6\' 4"  (1.93 m)   Body mass index is 22.64 kg/m. Gen: WD/WN, NAD Head: [place]/AT, No temporalis wasting.  Ear/Nose/Throat: Hearing grossly intact, nares w/o erythema or drainage, poor dentition Eyes: PER, EOMI, sclera nonicteric.  Neck: Supple, no masses.  No bruit or JVD.  Pulmonary:  Good air movement, clear to auscultation bilaterally, no use of accessory muscles.  Cardiac: RRR, normal S1, S2, no Murmurs. Vascular: right foot is healed Vessel Right Left  Radial Palpable Palpable  PT Palpable Palpable  DP Palpable Palpable  Gastrointestinal: soft, non-distended. No guarding/no peritoneal signs.  Musculoskeletal: M/S 5/5 throughout.  No deformity or atrophy.  Neurologic: CN 2-12 intact. Pain and light touch intact in extremities.  Symmetrical.  Speech is fluent. Motor exam as listed above. Psychiatric: Judgment intact, Mood & affect appropriate for pt's clinical situation. Dermatologic: No rashes or ulcers noted.  No changes consistent with cellulitis.   CBC Lab Results  Component Value Date     WBC 15.5 (H) 04/30/2019   HGB 14.6 04/30/2019   HCT 42.0 04/30/2019   MCV 92.5 04/30/2019   PLT 279 04/30/2019    BMET    Component Value Date/Time   NA 127 (L) 04/30/2019 1520   NA 138 11/13/2018 1423   NA 142 02/24/2013 2119   K 4.0 04/30/2019 1520   K 3.6 02/24/2013 2119   CL 95 (L) 04/30/2019 1520   CL 107 02/24/2013 2119   CO2 23 04/30/2019 1520   CO2 30 02/24/2013 2119   GLUCOSE 145 (H) 04/30/2019 1520   GLUCOSE 94 02/24/2013 2119   BUN 28 (H) 04/30/2019 1520   BUN 18 11/13/2018 1423   BUN 8 02/24/2013 2119   CREATININE  1.32 (H) 04/30/2019 1520   CREATININE 0.80 02/24/2013 2119   CALCIUM 7.9 (L) 04/30/2019 1520   CALCIUM 8.4 (L) 02/24/2013 2119   GFRNONAA >60 04/30/2019 1520   GFRNONAA >60 02/24/2013 2119   GFRAA >60 04/30/2019 1520   GFRAA >60 02/24/2013 2119   CrCl cannot be calculated (Patient's most recent lab result is older than the maximum 21 days allowed.).  COAG No results found for: INR, PROTIME  Radiology No results found.    Assessment/Plan 1. PAD (peripheral artery disease) (HCC) Recommend:  I do not find evidence of life style limiting vascular disease. The patient specifically denies life style limitation.  Previous noninvasive studies including ABI's of the legs do not identify critical vascular problems.  The patient should continue walking and begin a more formal exercise program. The patient should continue his antiplatelet therapy and aggressive treatment of the lipid abnormalities.  The patient should begin wearing graduated compression socks 15-20 mmHg strength to control her mild edema.  Patient will follow-up with me on a PRN basis   2. Essential hypertension Continue antihypertensive medications as already ordered, these medications have been reviewed and there are no changes at this time.   3. Mixed hyperlipidemia Continue statin as ordered and reviewed, no changes at this time   4. Gastroesophageal reflux disease,  unspecified whether esophagitis present Continue PPI as already ordered, this medication has been reviewed and there are no changes at this time.  Avoidence of caffeine and alcohol  Moderate elevation of the head of the bed    Levora Dredge, MD  07/21/2019 2:58 PM

## 2019-07-29 ENCOUNTER — Telehealth: Payer: Self-pay

## 2019-07-29 NOTE — Telephone Encounter (Signed)
Confirmed and screened for 07-31-19 ov. 

## 2019-07-31 ENCOUNTER — Ambulatory Visit (INDEPENDENT_AMBULATORY_CARE_PROVIDER_SITE_OTHER): Payer: Medicare HMO | Admitting: Adult Health

## 2019-07-31 ENCOUNTER — Encounter: Payer: Self-pay | Admitting: Adult Health

## 2019-07-31 ENCOUNTER — Other Ambulatory Visit: Payer: Self-pay

## 2019-07-31 VITALS — BP 146/90 | HR 76 | Temp 98.0°F | Resp 16 | Ht 76.0 in | Wt 194.8 lb

## 2019-07-31 DIAGNOSIS — R300 Dysuria: Secondary | ICD-10-CM | POA: Diagnosis not present

## 2019-07-31 DIAGNOSIS — Z79899 Other long term (current) drug therapy: Secondary | ICD-10-CM

## 2019-07-31 DIAGNOSIS — Z0001 Encounter for general adult medical examination with abnormal findings: Secondary | ICD-10-CM

## 2019-07-31 DIAGNOSIS — M545 Low back pain, unspecified: Secondary | ICD-10-CM

## 2019-07-31 DIAGNOSIS — Z125 Encounter for screening for malignant neoplasm of prostate: Secondary | ICD-10-CM | POA: Diagnosis not present

## 2019-07-31 DIAGNOSIS — Z1159 Encounter for screening for other viral diseases: Secondary | ICD-10-CM

## 2019-07-31 MED ORDER — TRAMADOL HCL 50 MG PO TABS: 50.0000 mg | ORAL_TABLET | Freq: Two times a day (BID) | ORAL | 0 refills | Status: DC | PRN
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
## 2019-08-01 LAB — URINALYSIS, ROUTINE W REFLEX MICROSCOPIC
Bilirubin, UA: NEGATIVE
Glucose, UA: NEGATIVE
Ketones, UA: NEGATIVE
Leukocytes,UA: NEGATIVE
Nitrite, UA: NEGATIVE
Protein,UA: NEGATIVE
RBC, UA: NEGATIVE
Specific Gravity, UA: 1.009 (ref 1.005–1.030)
Urobilinogen, Ur: 0.2 mg/dL (ref 0.2–1.0)
pH, UA: 6 (ref 5.0–7.5)

## 2019-08-12 ENCOUNTER — Other Ambulatory Visit: Payer: Self-pay

## 2019-08-12 MED ORDER — TRAZODONE HCL 50 MG PO TABS: 50.0000 mg | ORAL_TABLET | Freq: Every day | ORAL | 1 refills | Status: DC

## 2019-08-20 ENCOUNTER — Ambulatory Visit: Payer: Medicare HMO | Admitting: Adult Health

## 2019-08-20 ENCOUNTER — Telehealth: Payer: Self-pay

## 2019-08-20 NOTE — Telephone Encounter (Signed)
Confirmed and screened for 08-22-19 ov. Has not been vaccinated.

## 2019-08-21 ENCOUNTER — Telehealth: Payer: Self-pay

## 2019-08-21 NOTE — Telephone Encounter (Signed)
Confirmed and screened for 08-25-19 ov. °

## 2019-08-22 ENCOUNTER — Ambulatory Visit: Payer: Medicare HMO | Admitting: Nurse Practitioner

## 2019-08-23 LAB — CBC WITH DIFFERENTIAL/PLATELET
Basophils Absolute: 0.1 10*3/uL (ref 0.0–0.2)
Basos: 1 %
EOS (ABSOLUTE): 0.3 10*3/uL (ref 0.0–0.4)
Eos: 4 %
Hematocrit: 39.5 % (ref 37.5–51.0)
Hemoglobin: 13.4 g/dL (ref 13.0–17.7)
Immature Grans (Abs): 0 10*3/uL (ref 0.0–0.1)
Immature Granulocytes: 0 %
Lymphocytes Absolute: 1.4 10*3/uL (ref 0.7–3.1)
Lymphs: 17 %
MCH: 30.5 pg (ref 26.6–33.0)
MCHC: 33.9 g/dL (ref 31.5–35.7)
MCV: 90 fL (ref 79–97)
Monocytes Absolute: 0.7 10*3/uL (ref 0.1–0.9)
Monocytes: 9 %
Neutrophils Absolute: 5.5 10*3/uL (ref 1.4–7.0)
Neutrophils: 69 %
Platelets: 338 10*3/uL (ref 150–450)
RBC: 4.4 x10E6/uL (ref 4.14–5.80)
RDW: 13.9 % (ref 11.6–15.4)
WBC: 8 10*3/uL (ref 3.4–10.8)

## 2019-08-23 LAB — HEMOGLOBIN A1C
Est. average glucose Bld gHb Est-mCnc: 111 mg/dL
Hgb A1c MFr Bld: 5.5 % (ref 4.8–5.6)

## 2019-08-23 LAB — COMPREHENSIVE METABOLIC PANEL
ALT: 54 IU/L — ABNORMAL HIGH (ref 0–44)
AST: 55 IU/L — ABNORMAL HIGH (ref 0–40)
Albumin/Globulin Ratio: 1.3 (ref 1.2–2.2)
Albumin: 3.8 g/dL (ref 3.8–4.9)
Alkaline Phosphatase: 106 IU/L (ref 48–121)
BUN/Creatinine Ratio: 15 (ref 9–20)
BUN: 16 mg/dL (ref 6–24)
Bilirubin Total: 0.6 mg/dL (ref 0.0–1.2)
CO2: 22 mmol/L (ref 20–29)
Calcium: 8.9 mg/dL (ref 8.7–10.2)
Chloride: 102 mmol/L (ref 96–106)
Creatinine, Ser: 1.08 mg/dL (ref 0.76–1.27)
GFR calc Af Amer: 90 mL/min/{1.73_m2} (ref >59)
GFR calc non Af Amer: 78 mL/min/{1.73_m2} (ref >59)
Globulin, Total: 2.9 g/dL (ref 1.5–4.5)
Glucose: 83 mg/dL (ref 65–99)
Potassium: 4.5 mmol/L (ref 3.5–5.2)
Sodium: 136 mmol/L (ref 134–144)
Total Protein: 6.7 g/dL (ref 6.0–8.5)

## 2019-08-23 LAB — LIPID PANEL WITH LDL/HDL RATIO
Cholesterol, Total: 132 mg/dL (ref 100–199)
HDL: 77 mg/dL (ref >39)
LDL Chol Calc (NIH): 45 mg/dL (ref 0–99)
LDL/HDL Ratio: 0.6 ratio (ref 0.0–3.6)
Triglycerides: 41 mg/dL (ref 0–149)
VLDL Cholesterol Cal: 10 mg/dL (ref 5–40)

## 2019-08-23 LAB — T4, FREE: Free T4: 1.29 ng/dL (ref 0.82–1.77)

## 2019-08-23 LAB — TSH: TSH: 1.31 u[IU]/mL (ref 0.450–4.500)

## 2019-08-23 LAB — PSA: Prostate Specific Ag, Serum: 1.5 ng/mL (ref 0.0–4.0)

## 2019-08-23 LAB — HEPATITIS C ANTIBODY: Hep C Virus Ab: >11 s/co ratio — ABNORMAL HIGH (ref 0.0–0.9)

## 2019-08-25 ENCOUNTER — Ambulatory Visit (INDEPENDENT_AMBULATORY_CARE_PROVIDER_SITE_OTHER): Payer: Medicare HMO | Admitting: Adult Health

## 2019-08-25 ENCOUNTER — Other Ambulatory Visit: Payer: Self-pay

## 2019-08-25 ENCOUNTER — Encounter: Payer: Self-pay | Admitting: Internal Medicine

## 2019-08-25 VITALS — BP 130/88 | HR 79 | Temp 97.5°F | Resp 16 | Ht 76.0 in | Wt 189.4 lb

## 2019-08-25 DIAGNOSIS — K219 Gastro-esophageal reflux disease without esophagitis: Secondary | ICD-10-CM

## 2019-08-25 DIAGNOSIS — I10 Essential (primary) hypertension: Secondary | ICD-10-CM

## 2019-08-25 DIAGNOSIS — E782 Mixed hyperlipidemia: Secondary | ICD-10-CM

## 2019-08-25 DIAGNOSIS — T300 Burn of unspecified body region, unspecified degree: Secondary | ICD-10-CM

## 2019-08-25 DIAGNOSIS — M545 Low back pain, unspecified: Secondary | ICD-10-CM

## 2019-08-25 DIAGNOSIS — R5383 Other fatigue: Secondary | ICD-10-CM | POA: Diagnosis not present

## 2019-08-25 DIAGNOSIS — F17219 Nicotine dependence, cigarettes, with unspecified nicotine-induced disorders: Secondary | ICD-10-CM

## 2019-08-25 MED ORDER — TRAMADOL HCL 50 MG PO TABS: 50.0000 mg | ORAL_TABLET | Freq: Two times a day (BID) | ORAL | 0 refills | Status: DC | PRN
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
## 2019-09-05 ENCOUNTER — Other Ambulatory Visit: Payer: Self-pay

## 2019-09-05 DIAGNOSIS — I6340 Cerebral infarction due to embolism of unspecified cerebral artery: Secondary | ICD-10-CM

## 2019-09-05 DIAGNOSIS — E782 Mixed hyperlipidemia: Secondary | ICD-10-CM

## 2019-09-05 DIAGNOSIS — F332 Major depressive disorder, recurrent severe without psychotic features: Secondary | ICD-10-CM

## 2019-09-05 DIAGNOSIS — I10 Essential (primary) hypertension: Secondary | ICD-10-CM

## 2019-09-05 MED ORDER — ASPIRIN 81 MG PO TBEC: 81.0000 mg | DELAYED_RELEASE_TABLET | Freq: Every day | ORAL | 3 refills | Status: DC

## 2019-09-05 MED ORDER — FLUOXETINE HCL 40 MG PO CAPS: ORAL_CAPSULE | ORAL | 1 refills | Status: DC

## 2019-09-05 MED ORDER — TRAZODONE HCL 50 MG PO TABS: 50.0000 mg | ORAL_TABLET | Freq: Every day | ORAL | 1 refills | Status: DC

## 2019-09-05 MED ORDER — CLOPIDOGREL BISULFATE 75 MG PO TABS: 75.0000 mg | ORAL_TABLET | Freq: Every day | ORAL | 3 refills | Status: DC

## 2019-09-05 MED ORDER — OMEPRAZOLE 20 MG PO CPDR: 20.0000 mg | DELAYED_RELEASE_CAPSULE | Freq: Every day | ORAL | 1 refills | Status: DC

## 2019-09-05 MED ORDER — ATORVASTATIN CALCIUM 40 MG PO TABS: 40.0000 mg | ORAL_TABLET | Freq: Every day | ORAL | 3 refills | Status: DC

## 2019-09-05 MED ORDER — CARVEDILOL 6.25 MG PO TABS: 6.2500 mg | ORAL_TABLET | Freq: Two times a day (BID) | ORAL | 2 refills | Status: DC

## 2019-09-25 ENCOUNTER — Telehealth: Payer: Self-pay

## 2019-09-25 NOTE — Telephone Encounter (Signed)
Confirmed OV for 9/7

## 2019-09-30 ENCOUNTER — Other Ambulatory Visit: Payer: Self-pay | Admitting: Adult Health

## 2019-09-30 ENCOUNTER — Encounter: Payer: Self-pay | Admitting: Hospice and Palliative Medicine

## 2019-09-30 ENCOUNTER — Other Ambulatory Visit: Payer: Self-pay

## 2019-09-30 ENCOUNTER — Ambulatory Visit (INDEPENDENT_AMBULATORY_CARE_PROVIDER_SITE_OTHER): Payer: Medicare HMO | Admitting: Hospice and Palliative Medicine

## 2019-09-30 DIAGNOSIS — R748 Abnormal levels of other serum enzymes: Secondary | ICD-10-CM | POA: Diagnosis not present

## 2019-09-30 DIAGNOSIS — I10 Essential (primary) hypertension: Secondary | ICD-10-CM | POA: Diagnosis not present

## 2019-09-30 DIAGNOSIS — M545 Low back pain, unspecified: Secondary | ICD-10-CM

## 2019-09-30 DIAGNOSIS — R1011 Right upper quadrant pain: Secondary | ICD-10-CM | POA: Diagnosis not present

## 2019-09-30 DIAGNOSIS — F17219 Nicotine dependence, cigarettes, with unspecified nicotine-induced disorders: Secondary | ICD-10-CM | POA: Diagnosis not present

## 2019-09-30 DIAGNOSIS — S3991XA Unspecified injury of abdomen, initial encounter: Secondary | ICD-10-CM

## 2019-09-30 NOTE — Progress Notes (Signed)
[facility] [address] [place], [place] [address]  Internal MEDICINE  Office Visit Note  Patient Name: Gerald Martin  [id number]  [id number]  Date of Service: 09/30/2019  Chief Complaint  Patient presents with  . Follow-up    pt has purple looking bump on abd, not tender  . Depression  . Hyperlipidemia  . Hypertension    HPI Patient is here today for routine follow-up 1. Discussed that his BP was elevated today. Forgot to take his BP medication last night and maybe a few nights before that as well. 2. Discussed his most recent lab work which revealed elevated liver enzymes as well as elevated Hep. C antibody. He does have a history of heavy drinking, he is unclear as to when he stopped drinking heavily. 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 3. Continues to smoke 2 packs of cigarettes a day. At this time he is not interested in smoking cessation. Has not been using his inhalers for quite some time as he feels since he continues to smoke there is no purpose to use inhaler therapy. 4. Has a purple bruise with a knot on his left upper abdomen. Denies injury or trauma.  Current Medication: Outpatient Encounter Medications as of 09/30/2019  Medication Sig  . amLODipine (NORVASC) 10 MG tablet Take 1 tablet (10 mg total) by mouth daily.  Marland Kitchen aspirin 81 MG EC tablet Take 1 tablet (81 mg total) by mouth daily.  Marland Kitchen atorvastatin (LIPITOR) 40 MG tablet Take 1 tablet (40 mg total) by mouth daily at 6 PM.  . carvedilol (COREG) 6.25 MG tablet Take 1 tablet (6.25 mg total) by mouth 2 (two) times daily with a meal.  . clopidogrel (PLAVIX) 75 MG tablet Take 1 tablet (75 mg total) by mouth daily.  . COMBIVENT RESPIMAT 20-100 MCG/ACT AERS respimat Inhale 1 puff into the lungs 4 (four) times daily.  Marland Kitchen FLUoxetine (PROZAC) 40 MG capsule TAKE 1 CAPSULE BY MOUTH DAILY  . Ipratropium-Albuterol (COMBIVENT RESPIMAT) 20-100 MCG/ACT AERS respimat  Inhale 1 puff into the lungs 4 (four) times daily.  Marland Kitchen lisinopril (ZESTRIL) 40 MG tablet Take 1 tablet (40 mg total) by mouth daily.  . meclizine (ANTIVERT) 25 MG tablet Take 1 tablet (25 mg total) by mouth 3 (three) times daily as needed for dizziness.  Marland Kitchen omeprazole (PRILOSEC) 20 MG capsule Take 1 capsule (20 mg total) by mouth daily.  . silver sulfADIAZINE (SILVADENE) 1 % cream Apply to affected area daily  . SPIRIVA HANDIHALER 18 MCG inhalation capsule Place 1 puff into inhaler and inhale daily.  . traZODone (DESYREL) 50 MG tablet Take 1 tablet (50 mg total) by mouth at bedtime.  . [DISCONTINUED] traMADol (ULTRAM) 50 MG tablet Take 1 tablet (50 mg total) by mouth every 12 (twelve) hours as needed. for pain   No facility-administered encounter medications on file as of 09/30/2019.    Surgical History: Past Surgical History:  Procedure Laterality Date  . arm surgery[Other] Left    chain saw injury  . chronic pain    . LEG SURGERY Right     Medical History: Past Medical History:  Diagnosis Date  . Anxiety   . Chronic pain    back  . COPD (chronic obstructive pulmonary disease) (HCC)   . Depression   . GERD (gastroesophageal reflux disease)   . Hyperlipidemia   . Hypertension   . Stroke [facility])     Family  History: Family History  Problem Relation Age of Onset  . Stroke Mother   . Diabetes Father     Social History   Socioeconomic History  . Marital status: Single    Spouse name: Not on file  . Number of children: Not on file  . Years of education: Not on file  . Highest education level: Not on file  Occupational History  . Not on file  Tobacco Use  . Smoking status: Current Every Day Smoker    Packs/day: 1.00    Years: 30.00    Pack years: 30.00    Types: Cigarettes  . Smokeless tobacco: Never Used  Vaping Use  . Vaping Use: Never used  Substance and Sexual Activity  . Alcohol use: Not Currently  . Drug use: Never  . Sexual activity: Not on file  Other  Topics Concern  . Not on file  Social History Narrative   ** Merged History Encounter **       Social Determinants of Health   Financial Resource Strain:   . Difficulty of Paying Living Expenses: Not on file  Food Insecurity:   . Worried About Programme researcher, broadcasting/film/video in the Last Year: Not on file  . Ran Out of Food in the Last Year: Not on file  Transportation Needs:   . Lack of Transportation (Medical): Not on file  . Lack of Transportation (Non-Medical): Not on file  Physical Activity:   . Days of Exercise per Week: Not on file  . Minutes of Exercise per Session: Not on file  Stress:   . Feeling of Stress : Not on file  Social Connections:   . Frequency of Communication with Friends and Family: Not on file  . Frequency of Social Gatherings with Friends and Family: Not on file  . Attends Religious Services: Not on file  . Active Member of Clubs or Organizations: Not on file  . Attends Banker Meetings: Not on file  . Marital Status: Not on file  Intimate Partner Violence:   . Fear of Current or Ex-Partner: Not on file  . Emotionally Abused: Not on file  . Physically Abused: Not on file  . Sexually Abused: Not on file   Review of Systems  Constitutional: Negative for activity change, chills, fatigue, fever and unexpected weight change.  HENT: Negative for congestion, postnasal drip, rhinorrhea, sneezing and sore throat.   Eyes: Negative for photophobia and visual disturbance.  Respiratory: Negative for cough, chest tightness and shortness of breath.   Cardiovascular: Negative for chest pain, palpitations and leg swelling.  Gastrointestinal: Negative for abdominal pain, constipation, diarrhea, nausea and vomiting.  Genitourinary: Negative for dysuria and frequency.  Musculoskeletal: Negative for arthralgias, back pain, joint swelling and neck pain.  Skin: Negative for rash.       Bruise with knot to left upper abdomen  Neurological: Negative for dizziness,  tremors, weakness, numbness and headaches.  Hematological: Negative for adenopathy. Does not bruise/bleed easily.  Psychiatric/Behavioral: Negative for behavioral problems (Depression), sleep disturbance and suicidal ideas. The patient is not nervous/anxious.     Vital Signs: BP (!) 124/101   Pulse 84   Temp 97.9 F (36.6 C)   Resp 16   Ht 6\' 4"  (1.93 m)   Wt 185 lb 6.4 oz (84.1 kg)   SpO2 96%   BMI 22.57 kg/m    Physical Exam Vitals reviewed.  Constitutional:      Appearance: Normal appearance.  HENT:     Mouth/Throat:  Mouth: Mucous membranes are moist.     Pharynx: Oropharynx is clear.  Cardiovascular:     Rate and Rhythm: Normal rate and regular rhythm.     Pulses: Normal pulses.     Heart sounds: Normal heart sounds.  Pulmonary:     Effort: Pulmonary effort is normal.     Breath sounds: Normal breath sounds.  Abdominal:     General: There is distension.     Palpations: There is hepatomegaly.     Tenderness: There is abdominal tenderness.       Comments: Purplish ecchymotic area to left upper abdomen, soft knot felt below area.  Musculoskeletal:        General: Normal range of motion.     Cervical back: Normal range of motion.  Skin:    General: Skin is warm.  Neurological:     General: No focal deficit present.     Mental Status: He is alert and oriented to person, place, and time. Mental status is at baseline.  Psychiatric:        Mood and Affect: Mood normal.        Behavior: Behavior normal.        Thought Content: Thought content normal.     Assessment/Plan: 1. Right upper quadrant abdominal pain Has intermittent right upper abdominal pain, will review [place] results. - US Abdomen Complete; Future  2. Nicotine dependence, cigarettes, with unspecified nicotine-induced disorders Smoking 2 packs per day, not interested in cessation at this time. Last CXR 2020, normal findings at that time. Ordered PFT for monitoring of underlying pulmonary disease.  Not using his inhalers at this time, discussed the importance of using these even though he continues to smoke. - Pulmonary function test; Future  3. Essential hypertension BP elevated today, recheck not improved. He has not been adherent to his medications. Discussed the importance of adhering to all of his medications. Will need to continue monitoring BP.  4. Elevated liver enzymes Elevated liver enzymes as well as elevated Hep. C antibody. Will review results of abdominal US for potential liver disease. Has a history of heavy alcohol use as well as IV drug use. - US Abdomen Complete; Future//will need GI referral based on results  5. Closed abdominal injury, initial encounter Ecchymotic area with minimal swelling to left upper abdomen. He does not recall trauma or injury to the area. Not associated with pain. Will closely monitor area for worsening or improvement.  General Counseling: Lenora Boys understanding of the findings of todays visit and agrees with plan of treatment. I have discussed any further diagnostic evaluation that may be needed or ordered today. We also reviewed his medications today. he has been encouraged to call the office with any questions or concerns that should arise related to todays visit.  Smoking cessation counseling: 1. Pt acknowledges the risks of long term smoking, she will try to quite smoking. 2. Options for different medications including nicotine products, chewing gum, patch etc, Wellbutrin and Chantix is discussed 3. Goal and date of compete cessation is discussed 4. Total time spent in smoking cessation is 15 min.    Orders Placed This Encounter  Procedures  . US Abdomen Complete  . Pulmonary function test      Time spent: 30 Minutes Time spent includes review of chart, medications, test results and follow-up plan with the patient.  This patient was seen by Leeanne Deed AGNP-C in Collaboration with Dr Lyndon Code as a part of  collaborative care agreement  Tanna Furry Jacara Benito AGNP-C Internal medicine

## 2019-10-13 ENCOUNTER — Telehealth: Payer: Self-pay

## 2019-10-13 NOTE — Telephone Encounter (Signed)
Unable to confirm patient Gerald Martin 10/15/19

## 2019-10-15 ENCOUNTER — Ambulatory Visit (INDEPENDENT_AMBULATORY_CARE_PROVIDER_SITE_OTHER): Payer: Medicare HMO

## 2019-10-15 ENCOUNTER — Other Ambulatory Visit: Payer: Self-pay

## 2019-10-15 DIAGNOSIS — R1011 Right upper quadrant pain: Secondary | ICD-10-CM | POA: Diagnosis not present

## 2019-10-16 ENCOUNTER — Other Ambulatory Visit: Payer: Self-pay | Admitting: Internal Medicine

## 2019-10-16 DIAGNOSIS — M545 Low back pain, unspecified: Secondary | ICD-10-CM

## 2019-10-21 ENCOUNTER — Ambulatory Visit: Payer: Medicare HMO | Admitting: Hospice and Palliative Medicine

## 2019-11-05 ENCOUNTER — Other Ambulatory Visit: Payer: Self-pay | Admitting: Hospice and Palliative Medicine

## 2019-11-05 ENCOUNTER — Other Ambulatory Visit: Payer: Self-pay | Admitting: Internal Medicine

## 2019-11-05 DIAGNOSIS — M545 Low back pain, unspecified: Secondary | ICD-10-CM

## 2019-11-05 NOTE — Telephone Encounter (Signed)
LOV: 09/30/19 NOV: 11/26/19 LRF: 10/16/19

## 2019-11-06 NOTE — Telephone Encounter (Signed)
Can you review and complete if possible

## 2019-11-21 ENCOUNTER — Other Ambulatory Visit: Payer: Medicare HMO

## 2019-11-21 ENCOUNTER — Other Ambulatory Visit: Payer: Self-pay

## 2019-11-25 ENCOUNTER — Ambulatory Visit: Payer: Medicare HMO | Admitting: Hospice and Palliative Medicine

## 2019-11-26 ENCOUNTER — Ambulatory Visit: Payer: Medicare HMO | Admitting: Hospice and Palliative Medicine

## 2019-12-20 ENCOUNTER — Other Ambulatory Visit: Payer: Self-pay | Admitting: Internal Medicine

## 2019-12-20 DIAGNOSIS — M545 Low back pain, unspecified: Secondary | ICD-10-CM

## 2019-12-22 ENCOUNTER — Other Ambulatory Visit: Payer: Self-pay

## 2019-12-22 MED ORDER — TRAZODONE HCL 50 MG PO TABS
50.0000 mg | ORAL_TABLET | Freq: Every day | ORAL | 0 refills | Status: DC
Start: 2019-12-22 — End: 2020-01-20

## 2019-12-25 ENCOUNTER — Ambulatory Visit: Payer: Medicare HMO | Admitting: Internal Medicine

## 2019-12-30 ENCOUNTER — Ambulatory Visit: Payer: Medicare HMO | Admitting: Internal Medicine

## 2020-01-05 ENCOUNTER — Telehealth: Payer: Self-pay

## 2020-01-05 NOTE — Telephone Encounter (Signed)
Per [facility] this patient is okay to be seen once a year for routine wellness visit and medication refills.

## 2020-01-20 ENCOUNTER — Other Ambulatory Visit: Payer: Self-pay | Admitting: Internal Medicine

## 2020-01-20 DIAGNOSIS — I10 Essential (primary) hypertension: Secondary | ICD-10-CM

## 2020-01-20 DIAGNOSIS — I6340 Cerebral infarction due to embolism of unspecified cerebral artery: Secondary | ICD-10-CM

## 2020-01-20 DIAGNOSIS — E782 Mixed hyperlipidemia: Secondary | ICD-10-CM

## 2020-01-20 DIAGNOSIS — M545 Low back pain, unspecified: Secondary | ICD-10-CM

## 2020-01-20 MED ORDER — ATORVASTATIN CALCIUM 40 MG PO TABS: 40.0000 mg | ORAL_TABLET | Freq: Every day | ORAL | 3 refills | Status: DC

## 2020-01-20 MED ORDER — CARVEDILOL 6.25 MG PO TABS: 6.2500 mg | ORAL_TABLET | Freq: Two times a day (BID) | ORAL | 2 refills | Status: DC

## 2020-01-20 MED ORDER — CLOPIDOGREL BISULFATE 75 MG PO TABS: 75.0000 mg | ORAL_TABLET | Freq: Every day | ORAL | 3 refills | Status: DC

## 2020-01-20 MED ORDER — ASPIRIN 81 MG PO TBEC: 81.0000 mg | DELAYED_RELEASE_TABLET | Freq: Every day | ORAL | 3 refills | Status: DC

## 2020-02-03 ENCOUNTER — Ambulatory Visit: Payer: Medicare HMO | Admitting: Nurse Practitioner

## 2020-02-04 ENCOUNTER — Encounter: Payer: Self-pay | Admitting: Hospice and Palliative Medicine

## 2020-02-04 ENCOUNTER — Ambulatory Visit (INDEPENDENT_AMBULATORY_CARE_PROVIDER_SITE_OTHER): Payer: Medicare HMO | Admitting: Hospice and Palliative Medicine

## 2020-02-04 ENCOUNTER — Other Ambulatory Visit: Payer: Self-pay

## 2020-02-04 VITALS — BP 132/90 | HR 70 | Temp 97.8°F | Resp 16 | Ht 76.0 in | Wt 186.0 lb

## 2020-02-04 DIAGNOSIS — M545 Low back pain, unspecified: Secondary | ICD-10-CM

## 2020-02-04 DIAGNOSIS — I10 Essential (primary) hypertension: Secondary | ICD-10-CM

## 2020-02-04 DIAGNOSIS — R748 Abnormal levels of other serum enzymes: Secondary | ICD-10-CM

## 2020-02-04 MED ORDER — TRAMADOL HCL 50 MG PO TABS: ORAL_TABLET | ORAL | 1 refills | Status: DC

## 2020-02-04 NOTE — Progress Notes (Signed)
[facility] [address] [place], [place] [address]  Internal MEDICINE  Office Visit Note  Patient Name: Gerald Martin  [id number]  [id number]  Date of Service: 02/07/2020  Chief Complaint  Patient presents with  . Follow-up    Medication refills, headache in left eyeball, discuss booster   . Hypertension  . Hyperlipidemia  . Depression    HPI Patient is here for routine follow-up Overall, he has been doing well  Complaining of headaches, he has not had a headache in over a week, had about 1-2 episodes last week but headaches resolved with acetaminophen  Has been compliant with his medications, has not missed doses of his blood pressure medications  Requesting refills of tramadol for chronic back pain  Continues to smoke about a pack per day, no longer drinking alcohol--stopped before the holidays  Current Medication: Outpatient Encounter Medications as of 02/04/2020  Medication Sig  . amLODipine (NORVASC) 10 MG tablet Take 1 tablet (10 mg total) by mouth daily.  Marland Kitchen aspirin 81 MG EC tablet Take 1 tablet (81 mg total) by mouth daily.  Marland Kitchen atorvastatin (LIPITOR) 40 MG tablet Take 1 tablet (40 mg total) by mouth daily at 6 PM.  . carvedilol (COREG) 6.25 MG tablet Take 1 tablet (6.25 mg total) by mouth 2 (two) times daily with a meal.  . clopidogrel (PLAVIX) 75 MG tablet Take 1 tablet (75 mg total) by mouth daily.  . COMBIVENT RESPIMAT 20-100 MCG/ACT AERS respimat Inhale 1 puff into the lungs 4 (four) times daily.  Marland Kitchen FLUoxetine (PROZAC) 40 MG capsule TAKE 1 CAPSULE BY MOUTH DAILY  . Ipratropium-Albuterol (COMBIVENT RESPIMAT) 20-100 MCG/ACT AERS respimat Inhale 1 puff into the lungs 4 (four) times daily.  Marland Kitchen lisinopril (ZESTRIL) 40 MG tablet Take 1 tablet (40 mg total) by mouth daily.  . meclizine (ANTIVERT) 25 MG tablet Take 1 tablet (25 mg total) by mouth 3 (three) times daily as needed for dizziness.  Marland Kitchen omeprazole (PRILOSEC) 20 MG capsule Take 1 capsule (20 mg  total) by mouth daily.  . silver sulfADIAZINE (SILVADENE) 1 % cream Apply to affected area daily  . SPIRIVA HANDIHALER 18 MCG inhalation capsule Place 1 puff into inhaler and inhale daily.  . traMADol (ULTRAM) 50 MG tablet Take one tablet every 12 hours as needed for severe pain.  . traZODone (DESYREL) 50 MG tablet TAKE 1 TABLET BY MOUTH AT BEDTIME  . [DISCONTINUED] traMADol (ULTRAM) 50 MG tablet TAKE 1 TABLET BY MOUTH EVERY 12 HOURS ASNEEDED PAIN  . [DISCONTINUED] traMADol (ULTRAM) 50 MG tablet Take one tablet every 12 hours as needed for severe pain.   No facility-administered encounter medications on file as of 02/04/2020.    Surgical History: Past Surgical History:  Procedure Laterality Date  . arm surgery[Other] Left    chain saw injury  . chronic pain    . LEG SURGERY Right     Medical History: Past Medical History:  Diagnosis Date  . Anxiety   . Chronic pain    back  . COPD (chronic obstructive pulmonary disease) (HCC)   . Depression   . GERD (gastroesophageal reflux disease)   . Hyperlipidemia   . Hypertension   . Stroke [facility])     Family History: Family History  Problem Relation Age of Onset  . Stroke Mother   . Diabetes Father     Social History   Socioeconomic History  . Marital status: Single    Spouse name: Not on file  . Number  of children: Not on file  . Years of education: Not on file  . Highest education level: Not on file  Occupational History  . Not on file  Tobacco Use  . Smoking status: Current Every Day Smoker    Packs/day: 1.00    Years: 30.00    Pack years: 30.00    Types: Cigarettes  . Smokeless tobacco: Never Used  Vaping Use  . Vaping Use: Never used  Substance and Sexual Activity  . Alcohol use: Not Currently  . Drug use: Never  . Sexual activity: Not on file  Other Topics Concern  . Not on file  Social History Narrative   ** Merged History Encounter **       Social Determinants of Health   Financial Resource Strain:  Not on file  Food Insecurity: Not on file  Transportation Needs: Not on file  Physical Activity: Not on file  Stress: Not on file  Social Connections: Not on file  Intimate Partner Violence: Not on file      Review of Systems  Constitutional: Negative for chills, fatigue and unexpected weight change.  HENT: Negative for congestion, postnasal drip, rhinorrhea, sneezing and sore throat.   Eyes: Negative for redness.  Respiratory: Negative for cough, chest tightness and shortness of breath.   Cardiovascular: Negative for chest pain and palpitations.  Gastrointestinal: Negative for abdominal pain, constipation, diarrhea, nausea and vomiting.  Genitourinary: Negative for dysuria and frequency.  Musculoskeletal: Positive for arthralgias and back pain. Negative for joint swelling.  Skin: Negative for rash.  Neurological: Negative for tremors and numbness.  Hematological: Negative for adenopathy. Does not bruise/bleed easily.  Psychiatric/Behavioral: Negative for behavioral problems (Depression), sleep disturbance and suicidal ideas. The patient is not nervous/anxious.     Vital Signs: BP 132/90   Pulse 70   Temp 97.8 F (36.6 C)   Resp 16   Ht 6\' 4"  (1.93 m)   Wt 186 lb (84.4 kg)   SpO2 98%   BMI 22.64 kg/m    Physical Exam Vitals reviewed.  Constitutional:      Appearance: Normal appearance. He is normal weight.  Cardiovascular:     Rate and Rhythm: Normal rate and regular rhythm.     Pulses: Normal pulses.     Heart sounds: Normal heart sounds.  Pulmonary:     Effort: Pulmonary effort is normal.     Breath sounds: Normal breath sounds.  Abdominal:     General: Abdomen is flat.     Palpations: Abdomen is soft.  Musculoskeletal:        General: Normal range of motion.     Cervical back: Normal range of motion.  Skin:    General: Skin is warm.  Neurological:     General: No focal deficit present.     Mental Status: He is alert and oriented to person, place, and  time. Mental status is at baseline.  Psychiatric:        Mood and Affect: Mood normal.        Behavior: Behavior normal.        Thought Content: Thought content normal.        Judgment: Judgment normal.    Assessment/Plan: 1. Essential hypertension BP and HR well controlled, encouraged continued medication compliance   2. Low back pain, unspecified back pain laterality, unspecified chronicity, unspecified whether sciatica present Refills of tramadol given for severe pain PDMP reviewed, overdose risk 300 - traMADol (ULTRAM) 50 MG tablet; Take one tablet every 12  hours as needed for severe pain.  Dispense: 30 tablet; Refill: 1  3. Elevated liver enzymes LFT's elevated with liver enlargement on US--history of heavy ETOH abuse as well as IV drug use - Ambulatory referral to Gastroenterology  General Counseling: Lenora Boys understanding of the findings of todays visit and agrees with plan of treatment. I have discussed any further diagnostic evaluation that may be needed or ordered today. We also reviewed his medications today. he has been encouraged to call the office with any questions or concerns that should arise related to todays visit.    Orders Placed This Encounter  Procedures  . Ambulatory referral to Gastroenterology    Meds ordered this encounter  Medications  . DISCONTD: traMADol (ULTRAM) 50 MG tablet    Sig: Take one tablet every 12 hours as needed for severe pain.    Dispense:  30 tablet    Refill:  1  . traMADol (ULTRAM) 50 MG tablet    Sig: Take one tablet every 12 hours as needed for severe pain.    Dispense:  30 tablet    Refill:  1    Time spent: 30 Minutes Time spent includes review of chart, medications, test results and follow-up plan with the patient.  This patient was seen by Leeanne Deed AGNP-C in Collaboration with Dr Lyndon Code as a part of collaborative care agreement     Lubertha Basque. Cristino Degroff AGNP-C Internal medicine

## 2020-02-07 ENCOUNTER — Encounter: Payer: Self-pay | Admitting: Hospice and Palliative Medicine

## 2020-02-25 ENCOUNTER — Other Ambulatory Visit: Payer: Self-pay | Admitting: Hospice and Palliative Medicine

## 2020-02-25 DIAGNOSIS — M545 Low back pain, unspecified: Secondary | ICD-10-CM

## 2020-03-02 ENCOUNTER — Telehealth: Payer: Self-pay

## 2020-03-02 NOTE — Telephone Encounter (Signed)
Unable to [facility] to reschedule Friday afternoon appt 08-06-20

## 2020-03-23 ENCOUNTER — Encounter: Payer: Self-pay | Admitting: *Deleted

## 2020-03-23 ENCOUNTER — Ambulatory Visit: Payer: Medicare HMO | Admitting: Gastroenterology

## 2020-04-02 ENCOUNTER — Other Ambulatory Visit: Payer: Self-pay | Admitting: Internal Medicine

## 2020-04-06 ENCOUNTER — Other Ambulatory Visit: Payer: Self-pay

## 2020-04-06 ENCOUNTER — Ambulatory Visit (INDEPENDENT_AMBULATORY_CARE_PROVIDER_SITE_OTHER): Payer: Medicare HMO | Admitting: Internal Medicine

## 2020-04-06 ENCOUNTER — Encounter: Payer: Self-pay | Admitting: Hospice and Palliative Medicine

## 2020-04-06 VITALS — BP 122/78 | HR 77 | Temp 97.7°F | Resp 16 | Ht 76.0 in | Wt 188.8 lb

## 2020-04-06 DIAGNOSIS — I10 Essential (primary) hypertension: Secondary | ICD-10-CM

## 2020-04-06 DIAGNOSIS — E782 Mixed hyperlipidemia: Secondary | ICD-10-CM

## 2020-04-06 DIAGNOSIS — F17219 Nicotine dependence, cigarettes, with unspecified nicotine-induced disorders: Secondary | ICD-10-CM

## 2020-04-06 DIAGNOSIS — G8929 Other chronic pain: Secondary | ICD-10-CM

## 2020-04-06 DIAGNOSIS — M545 Low back pain, unspecified: Secondary | ICD-10-CM

## 2020-04-06 DIAGNOSIS — F332 Major depressive disorder, recurrent severe without psychotic features: Secondary | ICD-10-CM

## 2020-04-06 MED ORDER — TRAMADOL HCL 50 MG PO TABS: ORAL_TABLET | ORAL | 1 refills | Status: DC

## 2020-04-06 NOTE — Progress Notes (Signed)
[facility] [address] [place], [place] [address]  Internal MEDICINE  Office Visit Note  Patient Name: Gerald Martin  [id number]  [id number]  Date of Service: 04/13/2020  Chief Complaint  Patient presents with  . Follow-up  . Hyperlipidemia  . Hypertension  . Gastroesophageal Reflux  . Depression  . Anxiety  . COPD    HPI Pt is here for routine follow up Has major depression with depressed mood, chronic back pain. Tries to take his meds, he is here for refills on tramadol Continues to smoke Looks disheveled     Current Medication: Outpatient Encounter Medications as of 04/06/2020  Medication Sig  . amLODipine (NORVASC) 10 MG tablet Take 1 tablet (10 mg total) by mouth daily.  Marland Kitchen aspirin 81 MG EC tablet Take 1 tablet (81 mg total) by mouth daily.  Marland Kitchen atorvastatin (LIPITOR) 40 MG tablet Take 1 tablet (40 mg total) by mouth daily at 6 PM.  . carvedilol (COREG) 6.25 MG tablet Take 1 tablet (6.25 mg total) by mouth 2 (two) times daily with a meal.  . clopidogrel (PLAVIX) 75 MG tablet Take 1 tablet (75 mg total) by mouth daily.  . COMBIVENT RESPIMAT 20-100 MCG/ACT AERS respimat Inhale 1 puff into the lungs 4 (four) times daily.  Marland Kitchen FLUoxetine (PROZAC) 40 MG capsule TAKE 1 CAPSULE BY MOUTH DAILY  . Ipratropium-Albuterol (COMBIVENT RESPIMAT) 20-100 MCG/ACT AERS respimat Inhale 1 puff into the lungs 4 (four) times daily.  Marland Kitchen lisinopril (ZESTRIL) 40 MG tablet Take 1 tablet (40 mg total) by mouth daily.  . meclizine (ANTIVERT) 25 MG tablet Take 1 tablet (25 mg total) by mouth 3 (three) times daily as needed for dizziness.  Marland Kitchen omeprazole (PRILOSEC) 20 MG capsule Take 1 capsule (20 mg total) by mouth daily.  . silver sulfADIAZINE (SILVADENE) 1 % cream Apply to affected area daily  . SPIRIVA HANDIHALER 18 MCG inhalation capsule Place 1 puff into inhaler and inhale daily.  . traMADol (ULTRAM) 50 MG tablet One tab po tid for pain  . traZODone (DESYREL) 50 MG tablet TAKE 1  TABLET BY MOUTH AT BEDTIME  . [DISCONTINUED] traMADol (ULTRAM) 50 MG tablet TAKE 1 TABLET BY MOUTH EVERY 12 HOURS ASNEEDED SEVERE PAIN   No facility-administered encounter medications on file as of 04/06/2020.    Surgical History: Past Surgical History:  Procedure Laterality Date  . arm surgery[Other] Left    chain saw injury  . chronic pain    . LEG SURGERY Right     Medical History: Past Medical History:  Diagnosis Date  . Anxiety   . Chronic pain    back  . COPD (chronic obstructive pulmonary disease) (HCC)   . Depression   . GERD (gastroesophageal reflux disease)   . Hyperlipidemia   . Hypertension   . Stroke [facility])     Family History: Family History  Problem Relation Age of Onset  . Stroke Mother   . Diabetes Father     Social History   Socioeconomic History  . Marital status: Single    Spouse name: Not on file  . Number of children: Not on file  . Years of education: Not on file  . Highest education level: Not on file  Occupational History  . Not on file  Tobacco Use  . Smoking status: Current Every Day Smoker    Packs/day: 1.00    Years: 30.00    Pack years: 30.00    Types: Cigarettes  . Smokeless tobacco: Never Used  Vaping Use  . Vaping Use: Never used  Substance and Sexual Activity  . Alcohol use: Not Currently  . Drug use: Never  . Sexual activity: Not on file  Other Topics Concern  . Not on file  Social History Narrative   ** Merged History Encounter **       Social Determinants of Health   Financial Resource Strain: Not on file  Food Insecurity: Not on file  Transportation Needs: Not on file  Physical Activity: Not on file  Stress: Not on file  Social Connections: Not on file  Intimate Partner Violence: Not on file      Review of Systems  Constitutional: Negative for chills, fatigue and unexpected weight change.  HENT: Negative for congestion, postnasal drip, rhinorrhea, sneezing and sore throat.   Eyes: Negative for redness.   Respiratory: Negative for cough, chest tightness and shortness of breath.   Cardiovascular: Negative for chest pain and palpitations.  Gastrointestinal: Negative for abdominal pain, constipation, diarrhea, nausea and vomiting.  Genitourinary: Negative for dysuria and frequency.  Musculoskeletal: Positive for arthralgias. Negative for back pain, joint swelling and neck pain.  Skin: Negative for rash.  Neurological: Negative.  Negative for tremors and numbness.  Hematological: Negative for adenopathy. Does not bruise/bleed easily.  Psychiatric/Behavioral: Positive for dysphoric mood. Negative for behavioral problems (Depression), sleep disturbance and suicidal ideas. The patient is nervous/anxious.     Vital Signs: BP 122/78   Pulse 77   Temp 97.7 F (36.5 C)   Resp 16   Ht 6\' 4"  (1.93 m)   Wt 188 lb 12.8 oz (85.6 kg)   SpO2 97%   BMI 22.98 kg/m    Physical Exam Constitutional:      General: He is not in acute distress.    Appearance: He is well-developed. He is not diaphoretic.  HENT:     Head: Normocephalic and atraumatic.     Mouth/Throat:     Pharynx: No oropharyngeal exudate.  Eyes:     Pupils: Pupils are equal, round, and reactive to light.  Neck:     Thyroid: No thyromegaly.     Vascular: No JVD.     Trachea: No tracheal deviation.  Cardiovascular:     Rate and Rhythm: Normal rate and regular rhythm.     Heart sounds: Normal heart sounds. No murmur heard. No friction rub. No gallop.   Pulmonary:     Effort: Pulmonary effort is normal. No respiratory distress.     Breath sounds: No wheezing or rales.  Chest:     Chest wall: No tenderness.  Abdominal:     General: Bowel sounds are normal.     Palpations: Abdomen is soft.  Musculoskeletal:        General: Normal range of motion.     Cervical back: Normal range of motion and neck supple.  Lymphadenopathy:     Cervical: No cervical adenopathy.  Skin:    General: Skin is warm and dry.  Neurological:      Mental Status: He is alert and oriented to person, place, and time.     Cranial Nerves: No cranial nerve deficit.  Psychiatric:        Behavior: Behavior normal.        Thought Content: Thought content normal.        Judgment: Judgment normal.      Assessment/Plan: 1. Chronic midline low back pain without sciatica Continue tramadol as before able to do his ADL - traMADol (ULTRAM) 50 MG  tablet; One tab po tid for pain  Dispense: 90 tablet; Refill: 1  2. Essential hypertension Controlled with meds   3. Cigarette nicotine dependence with nicotine-induced disorder Encouraged smoking cessation   4. Mixed hyperlipidemia Continue Lipitor   5. Severe recurrent major depression without psychotic features (HCC) Continue Prozac   General Counseling: Gerald Martin verbalizes understanding of the findings of todays visit and agrees with plan of treatment. I have discussed any further diagnostic evaluation that may be needed or ordered today. We also reviewed his medications today. he has been encouraged to call the office with any questions or concerns that should arise related to todays visit.    No orders of the defined types were placed in this encounter.   Meds ordered this encounter  Medications  . traMADol (ULTRAM) 50 MG tablet    Sig: One tab po tid for pain    Dispense:  90 tablet    Refill:  1    Total time spent:30 Minutes Time spent includes review of chart, medications, test results, and follow up plan with the patient.   [place] Controlled Substance Database was reviewed by me.   Dr Lyndon Code Internal medicine

## 2020-04-07 ENCOUNTER — Telehealth: Payer: Self-pay | Admitting: Gastroenterology

## 2020-04-07 NOTE — Telephone Encounter (Signed)
Letter was returned to sender 

## 2020-05-10 ENCOUNTER — Encounter: Payer: Self-pay | Admitting: Gastroenterology

## 2020-05-10 ENCOUNTER — Telehealth: Payer: Self-pay | Admitting: Gastroenterology

## 2020-05-10 NOTE — Telephone Encounter (Signed)
Patient's letter was returned to sender 

## 2020-05-26 ENCOUNTER — Other Ambulatory Visit: Payer: Self-pay | Admitting: Internal Medicine

## 2020-05-26 DIAGNOSIS — I10 Essential (primary) hypertension: Secondary | ICD-10-CM

## 2020-06-08 ENCOUNTER — Encounter: Payer: Self-pay | Admitting: Gastroenterology

## 2020-06-08 ENCOUNTER — Other Ambulatory Visit: Payer: Self-pay

## 2020-06-08 ENCOUNTER — Ambulatory Visit (INDEPENDENT_AMBULATORY_CARE_PROVIDER_SITE_OTHER): Payer: Medicare HMO | Admitting: Gastroenterology

## 2020-06-08 VITALS — BP 146/79 | HR 71 | Ht 76.0 in | Wt 187.2 lb

## 2020-06-08 DIAGNOSIS — R748 Abnormal levels of other serum enzymes: Secondary | ICD-10-CM

## 2020-06-08 DIAGNOSIS — B182 Chronic viral hepatitis C: Secondary | ICD-10-CM | POA: Diagnosis not present

## 2020-06-08 NOTE — Progress Notes (Signed)
Gastroenterology Consultation  Referring Provider:     Theotis Burrow, NP Primary Care Physician:  Lyndon Code, MD Primary Gastroenterologist:  Dr. Servando Snare     Reason for Consultation:     Hepatitis C        HPI:   Gerald Martin is a 55 y.o. y/o male referred for consultation & management of Hepatitis C by Dr. Welton Flakes, Shannan Harper, MD.  This patient comes in today because of a history of abnormal liver enzymes and blood work that showed him to have a hepatitis C antibody positive. The patient denies ever using IV drugs while sharing any ago but did as a teenager.  He also denies any blood transfusions before 1990 or homemade tattoos.  The patient does say that he had a girlfriend who was hepatitis C positive but otherwise denies any other risk factors.  The patient does report that he is on anticoagulation for a history of strokes.  The patient denies a alcohol abuse and just states he drinks alcohol socially and had a couple of beers yesterday.  The patient does report that he had some lower abdominal discomfort on the right side a few weeks ago but it has since subsided.  There is no report of any black stools or bloody stools.  The patient also denies ever having a colonoscopy in the past reports that it is because see is apprehensive.  Past Medical History:  Diagnosis Date  . Anxiety   . Chronic pain    back  . COPD (chronic obstructive pulmonary disease) (HCC)   . Depression   . GERD (gastroesophageal reflux disease)   . Hyperlipidemia   . Hypertension   . Stroke [facility])     Past Surgical History:  Procedure Laterality Date  . arm surgery[Other] Left    chain saw injury  . chronic pain    . LEG SURGERY Right     Prior to Admission medications   Medication Sig Start Date End Date Taking? Authorizing Provider  amLODipine (NORVASC) 10 MG tablet Take 1 tablet (10 mg total) by mouth daily. 06/26/19  Yes Carlean Jews, NP  aspirin 81 MG EC tablet Take 1 tablet (81 mg total)  by mouth daily. 01/20/20  Yes Lyndon Code, MD  atorvastatin (LIPITOR) 40 MG tablet Take 1 tablet (40 mg total) by mouth daily at 6 PM. 01/20/20  Yes Lyndon Code, MD  carvedilol (COREG) 6.25 MG tablet TAKE 1 TABLET BY MOUTH TWICE DAILY WITH A MEAL 05/26/20  Yes Lyndon Code, MD  clopidogrel (PLAVIX) 75 MG tablet Take 1 tablet (75 mg total) by mouth daily. 01/20/20  Yes Lyndon Code, MD  COMBIVENT RESPIMAT 20-100 MCG/ACT AERS respimat Inhale 1 puff into the lungs 4 (four) times daily. 05/17/18  Yes [provider]  FLUoxetine (PROZAC) 40 MG capsule TAKE 1 CAPSULE BY MOUTH DAILY 09/05/19  Yes Boscia, Heather E, NP  Ipratropium-Albuterol (COMBIVENT RESPIMAT) 20-100 MCG/ACT AERS respimat Inhale 1 puff into the lungs 4 (four) times daily. 07/04/17  Yes Lyndon Code, MD  lisinopril (ZESTRIL) 40 MG tablet Take 1 tablet (40 mg total) by mouth daily. 06/26/19  Yes Carlean Jews, NP  meclizine (ANTIVERT) 25 MG tablet Take 1 tablet (25 mg total) by mouth 3 (three) times daily as needed for dizziness. 06/07/18  Yes Boscia, Kathlynn Grate, NP  omeprazole (PRILOSEC) 20 MG capsule Take 1 capsule (20 mg total) by mouth daily. 09/05/19  Yes Vincent Gros  E, NP  SPIRIVA HANDIHALER 18 MCG inhalation capsule Place 1 puff into inhaler and inhale daily. 05/15/18  Yes [provider]  traMADol Janean Sark) 50 MG tablet One tab po tid for pain 04/06/20  Yes Lyndon Code, MD  traZODone (DESYREL) 50 MG tablet TAKE 1 TABLET BY MOUTH AT BEDTIME 05/26/20  Yes Lyndon Code, MD    Family History  Problem Relation Age of Onset  . Stroke Mother   . Diabetes Father      Social History   Tobacco Use  . Smoking status: Current Every Day Smoker    Packs/day: 1.00    Years: 30.00    Pack years: 30.00    Types: Cigarettes  . Smokeless tobacco: Never Used  Vaping Use  . Vaping Use: Never used  Substance Use Topics  . Alcohol use: Not Currently  . Drug use: Never    Allergies as of 06/08/2020 - Review Complete  06/08/2020  Allergen Reaction Noted  . Penicillins Other (See Comments) 05/23/2019    Review of Systems:    All systems reviewed and negative except where noted in HPI.   Physical Exam:  BP (!) 146/79   Pulse 71   Ht 6\' 4"  (1.93 m)   Wt 187 lb 3.2 oz (84.9 kg)   BMI 22.79 kg/m  No LMP for male patient. General:   Alert,  Well-developed, well-nourished, pleasant and cooperative in NAD Head:  Normocephalic and atraumatic. Eyes:  Sclera clear, no icterus.   Conjunctiva pink. Ears:  Normal auditory acuity. Neck:  Supple; no masses or thyromegaly. Lungs:  Respirations even and unlabored.  Clear throughout to auscultation.   No wheezes, crackles, or rhonchi. No acute distress. Heart:  Regular rate and rhythm; no murmurs, clicks, rubs, or gallops. Abdomen:  Normal bowel sounds.  No bruits.  Soft, non-tender and non-distended without masses, hepatosplenomegaly or hernias noted.  No guarding or rebound tenderness.  Negative Carnett sign.   Rectal:  Deferred.  Pulses:  Normal pulses noted. Extremities:  No clubbing or edema.  No cyanosis. Neurologic:  Alert and oriented x3;  grossly normal neurologically. Skin:  Intact without significant lesions or rashes.  No jaundice. Lymph Nodes:  No significant cervical adenopathy. Psych:  Alert and cooperative. Normal mood and affect.  Imaging Studies: No results found.  Assessment and Plan:   Gerald Martin is a 55 y.o. y/o male Who comes in today with a positive hepatitis C antibody.  The patient has been told that he will have some lab work sent off to look for the genotype and viral load.  He will also have labs sent for other possible causes of abnormal liver enzymes and will have his fibrosis score calculated.  The patient will then be set up for treatment pending those results. The patient will also have his lab sent for his immunity to both hepatitis A and hepatitis B.  The patient will be notified of what vaccinations he may need. The  patient has been explained the plan and agrees with it.    58, MD. Midge Minium    Note: This dictation was prepared with Dragon dictation along with smaller phrase technology. Any transcriptional errors that result from this process are unintentional.

## 2020-06-11 LAB — HCV FIBROSURE
ALPHA 2-MACROGLOBULINS, QN: 167 mg/dL (ref 110–276)
ALT (SGPT) P5P: 30 IU/L (ref 0–55)
Apolipoprotein A-1: 131 mg/dL (ref 101–178)
Bilirubin, Total: 0.1 mg/dL (ref 0.0–1.2)
Fibrosis Score: 0.05 (ref 0.00–0.21)
GGT: 32 IU/L (ref 0–65)
Haptoglobin: 254 mg/dL (ref 29–370)
Necroinflammat Activity Score: 0.11 (ref 0.00–0.17)

## 2020-06-11 LAB — HEPATIC FUNCTION PANEL
ALT: 25 IU/L (ref 0–44)
AST: 29 IU/L (ref 0–40)
Albumin: 3.8 g/dL (ref 3.8–4.9)
Alkaline Phosphatase: 86 IU/L (ref 44–121)
Bilirubin Total: 0.3 mg/dL (ref 0.0–1.2)
Bilirubin, Direct: 0.12 mg/dL (ref 0.00–0.40)
Total Protein: 6.2 g/dL (ref 6.0–8.5)

## 2020-06-11 LAB — IRON AND TIBC
Iron Saturation: 10 % — ABNORMAL LOW (ref 15–55)
Iron: 39 ug/dL (ref 38–169)
Total Iron Binding Capacity: 389 ug/dL (ref 250–450)
UIBC: 350 ug/dL — ABNORMAL HIGH (ref 111–343)

## 2020-06-11 LAB — HEPATITIS C GENOTYPE

## 2020-06-11 LAB — HEPATITIS B SURFACE ANTIBODY,QUALITATIVE: Hep B Surface Ab, Qual: NONREACTIVE

## 2020-06-11 LAB — FERRITIN: Ferritin: 21 ng/mL — ABNORMAL LOW (ref 30–400)

## 2020-06-11 LAB — ANA: Anti Nuclear Antibody (ANA): NEGATIVE

## 2020-06-11 LAB — HCV RNA QUANT
HCV log10: 6.097 log10 IU/mL
Hepatitis C Quantitation: 1250000 IU/mL

## 2020-06-11 LAB — HEPATITIS A ANTIBODY, TOTAL: hep A Total Ab: NEGATIVE

## 2020-06-11 LAB — MITOCHONDRIAL ANTIBODIES: Mitochondrial Ab: <20 Units (ref 0.0–20.0)

## 2020-06-11 LAB — HEPATITIS B SURFACE ANTIGEN: Hepatitis B Surface Ag: NEGATIVE

## 2020-06-11 LAB — ALPHA-1-ANTITRYPSIN: A-1 Antitrypsin: 181 mg/dL (ref 101–187)

## 2020-06-11 LAB — ANTI-SMOOTH MUSCLE ANTIBODY, IGG: Smooth Muscle Ab: 6 Units (ref 0–19)

## 2020-06-11 LAB — CERULOPLASMIN: Ceruloplasmin: 29.3 mg/dL (ref 16.0–31.0)

## 2020-07-02 ENCOUNTER — Other Ambulatory Visit: Payer: Self-pay | Admitting: Internal Medicine

## 2020-07-02 ENCOUNTER — Other Ambulatory Visit: Payer: Self-pay | Admitting: Nurse Practitioner

## 2020-07-02 DIAGNOSIS — I6340 Cerebral infarction due to embolism of unspecified cerebral artery: Secondary | ICD-10-CM

## 2020-07-02 DIAGNOSIS — F332 Major depressive disorder, recurrent severe without psychotic features: Secondary | ICD-10-CM

## 2020-07-02 DIAGNOSIS — I161 Hypertensive emergency: Secondary | ICD-10-CM

## 2020-07-02 DIAGNOSIS — E782 Mixed hyperlipidemia: Secondary | ICD-10-CM

## 2020-07-05 ENCOUNTER — Other Ambulatory Visit: Payer: Self-pay | Admitting: Physician Assistant

## 2020-07-05 DIAGNOSIS — M545 Low back pain, unspecified: Secondary | ICD-10-CM

## 2020-07-06 ENCOUNTER — Other Ambulatory Visit: Payer: Self-pay

## 2020-07-06 DIAGNOSIS — F332 Major depressive disorder, recurrent severe without psychotic features: Secondary | ICD-10-CM

## 2020-07-06 MED ORDER — OMEPRAZOLE 20 MG PO CPDR: 20.0000 mg | DELAYED_RELEASE_CAPSULE | Freq: Every day | ORAL | 1 refills | Status: DC

## 2020-07-06 MED ORDER — LISINOPRIL 40 MG PO TABS: 40.0000 mg | ORAL_TABLET | Freq: Every day | ORAL | 1 refills | Status: DC

## 2020-07-06 MED ORDER — FLUOXETINE HCL 40 MG PO CAPS: ORAL_CAPSULE | ORAL | 1 refills | Status: DC

## 2020-07-07 ENCOUNTER — Telehealth: Payer: Self-pay

## 2020-07-07 NOTE — Telephone Encounter (Signed)
Tried contacting pt but vm wasn't set up. Went straight to vm.

## 2020-07-07 NOTE — Telephone Encounter (Signed)
-----   Message from Midge Minium, MD sent at 06/22/2020 10:43 AM EDT ----- This patient will need a vaccination for hepatitis A and hepatitis B.  The patient will also need to be treated for his hepatitis C.

## 2020-07-08 NOTE — Telephone Encounter (Signed)
Tried contacting pt regarding lab results again. No voicemail set up. Not able to leave message. No family member on DPR to contact.

## 2020-07-09 NOTE — Telephone Encounter (Signed)
Tried contact pt again today and still not able to contact pt.

## 2020-08-04 ENCOUNTER — Ambulatory Visit: Payer: Medicare HMO | Admitting: Adult Health

## 2020-08-05 ENCOUNTER — Other Ambulatory Visit: Payer: Self-pay | Admitting: Nurse Practitioner

## 2020-08-05 ENCOUNTER — Telehealth: Payer: Self-pay | Admitting: Gastroenterology

## 2020-08-05 ENCOUNTER — Other Ambulatory Visit: Payer: Self-pay | Admitting: Internal Medicine

## 2020-08-05 DIAGNOSIS — I161 Hypertensive emergency: Secondary | ICD-10-CM

## 2020-08-05 NOTE — Telephone Encounter (Signed)
[phone]. [phone]-Todd from [facility] wants to discuss DVT mavorit and Statin , wants a call back ASAP.

## 2020-08-06 ENCOUNTER — Ambulatory Visit: Payer: Medicare HMO | Admitting: Physician Assistant

## 2020-08-16 ENCOUNTER — Other Ambulatory Visit: Payer: Self-pay

## 2020-08-16 DIAGNOSIS — I161 Hypertensive emergency: Secondary | ICD-10-CM

## 2020-08-16 MED ORDER — AMLODIPINE BESYLATE 10 MG PO TABS: 10.0000 mg | ORAL_TABLET | Freq: Every day | ORAL | 1 refills | Status: DC

## 2020-08-23 ENCOUNTER — Encounter (INDEPENDENT_AMBULATORY_CARE_PROVIDER_SITE_OTHER): Payer: Self-pay

## 2020-08-23 ENCOUNTER — Ambulatory Visit (INDEPENDENT_AMBULATORY_CARE_PROVIDER_SITE_OTHER): Payer: Medicare HMO | Admitting: Physician Assistant

## 2020-08-23 ENCOUNTER — Encounter: Payer: Self-pay | Admitting: Physician Assistant

## 2020-08-23 ENCOUNTER — Telehealth: Payer: Self-pay

## 2020-08-23 ENCOUNTER — Other Ambulatory Visit: Payer: Self-pay

## 2020-08-23 DIAGNOSIS — F332 Major depressive disorder, recurrent severe without psychotic features: Secondary | ICD-10-CM | POA: Diagnosis not present

## 2020-08-23 DIAGNOSIS — M545 Low back pain, unspecified: Secondary | ICD-10-CM | POA: Diagnosis not present

## 2020-08-23 DIAGNOSIS — E782 Mixed hyperlipidemia: Secondary | ICD-10-CM | POA: Diagnosis not present

## 2020-08-23 DIAGNOSIS — I10 Essential (primary) hypertension: Secondary | ICD-10-CM | POA: Diagnosis not present

## 2020-08-23 DIAGNOSIS — Z0100 Encounter for examination of eyes and vision without abnormal findings: Secondary | ICD-10-CM

## 2020-08-23 DIAGNOSIS — Z0001 Encounter for general adult medical examination with abnormal findings: Secondary | ICD-10-CM | POA: Diagnosis not present

## 2020-08-23 DIAGNOSIS — G8929 Other chronic pain: Secondary | ICD-10-CM

## 2020-08-23 DIAGNOSIS — E041 Nontoxic single thyroid nodule: Secondary | ICD-10-CM

## 2020-08-23 DIAGNOSIS — R300 Dysuria: Secondary | ICD-10-CM

## 2020-08-23 DIAGNOSIS — B182 Chronic viral hepatitis C: Secondary | ICD-10-CM

## 2020-08-23 DIAGNOSIS — F17219 Nicotine dependence, cigarettes, with unspecified nicotine-induced disorders: Secondary | ICD-10-CM

## 2020-08-23 DIAGNOSIS — R5383 Other fatigue: Secondary | ICD-10-CM

## 2020-08-23 MED ORDER — TRAMADOL HCL 50 MG PO TABS: ORAL_TABLET | ORAL | 1 refills | Status: DC

## 2020-08-23 MED ORDER — SPIRIVA HANDIHALER 18 MCG IN CAPS: 18.0000 ug | ORAL_CAPSULE | Freq: Every day | RESPIRATORY_TRACT | 2 refills | Status: DC

## 2020-08-23 MED ORDER — BUPROPION HCL ER (XL) 150 MG PO TB24: 150.0000 mg | ORAL_TABLET | Freq: Every day | ORAL | 2 refills | Status: DC

## 2020-08-23 NOTE — Progress Notes (Signed)
[facility] [address] [place], [place] [address]  Internal MEDICINE  Office Visit Note  Patient Name: Gerald Martin  [id number]  [id number]  Date of Service: 08/25/2020  Chief Complaint  Patient presents with   Medicare Wellness     HPI Pt is here for routine health maintenance examination -needs to reschedule colonoscopy -Trying to quit smoking, 2ppd the other day, but normally closer to 1ppd. Interested in aid to help him stop and will try wellbutrin. -Had some increased salt today and smoked cig before coming in and may be contributing to higher BP. Discussed th importance of low salt diet and monitoring BP. Does not have a cuff to check, but discussed he can check at the pharmacy. Recheck of BP did improve, and patient is also questioning whether he took his BP meds today. -Some strain to left eye--needs eye appt as it has been a very long time since he was evaluated -He also has chronic  back pain and requests his tramadol refill as well as his inhaler refilled. -breathing normally stable -Due for routine fasting labs  Current Medication: Outpatient Encounter Medications as of 08/23/2020  Medication Sig   amLODipine (NORVASC) 10 MG tablet Take 1 tablet (10 mg total) by mouth daily.   ASPIRIN LOW DOSE 81 MG EC tablet TAKE 1 TABLET BY MOUTH ONCE DAILY   atorvastatin (LIPITOR) 40 MG tablet TAKE 1 TABLET BY MOUTH ONCE DAILY AT 6:00PM   buPROPion (WELLBUTRIN XL) 150 MG 24 hr tablet Take 1 tablet (150 mg total) by mouth daily.   carvedilol (COREG) 6.25 MG tablet TAKE 1 TABLET BY MOUTH TWICE DAILY WITH A MEAL   clopidogrel (PLAVIX) 75 MG tablet TAKE 1 TABLET BY MOUTH ONCE DAILY   COMBIVENT RESPIMAT 20-100 MCG/ACT AERS respimat Inhale 1 puff into the lungs 4 (four) times daily.   FLUoxetine (PROZAC) 40 MG capsule TAKE 1 CAPSULE BY MOUTH DAILY   Ipratropium-Albuterol (COMBIVENT RESPIMAT) 20-100 MCG/ACT AERS respimat Inhale 1 puff into the lungs 4 (four) times  daily.   lisinopril (ZESTRIL) 40 MG tablet Take 1 tablet (40 mg total) by mouth daily.   omeprazole (PRILOSEC) 20 MG capsule Take 1 capsule (20 mg total) by mouth daily.   traZODone (DESYREL) 50 MG tablet TAKE 1 TABLET BY MOUTH AT BEDTIME   [DISCONTINUED] SPIRIVA HANDIHALER 18 MCG inhalation capsule Place 1 puff into inhaler and inhale daily.   [DISCONTINUED] traMADol (ULTRAM) 50 MG tablet One tab po tid for pain   meclizine (ANTIVERT) 25 MG tablet Take 1 tablet (25 mg total) by mouth 3 (three) times daily as needed for dizziness. (Patient not taking: Reported on 08/23/2020)   SPIRIVA HANDIHALER 18 MCG inhalation capsule Place 1 capsule (18 mcg total) into inhaler and inhale daily.   traMADol (ULTRAM) 50 MG tablet One tab po tid for pain   No facility-administered encounter medications on file as of 08/23/2020.    Surgical History: Past Surgical History:  Procedure Laterality Date   arm surgery[Other] Left    chain saw injury   chronic pain     LEG SURGERY Right     Medical History: Past Medical History:  Diagnosis Date   Anxiety    Chronic pain    back   COPD (chronic obstructive pulmonary disease) (HCC)    Depression    GERD (gastroesophageal reflux disease)    Hyperlipidemia    Hypertension    Stroke [facility])     Family History: Family History  Problem Relation  Age of Onset   Stroke Mother    Diabetes Father       Review of Systems  Constitutional:  Negative for chills, fatigue and unexpected weight change.  HENT:  Negative for congestion, postnasal drip, rhinorrhea, sneezing and sore throat.   Eyes:  Positive for pain. Negative for redness.  Respiratory:  Negative for cough, chest tightness and shortness of breath.   Cardiovascular:  Negative for chest pain and palpitations.  Gastrointestinal:  Negative for abdominal pain, constipation, diarrhea, nausea and vomiting.  Genitourinary:  Negative for dysuria and frequency.  Musculoskeletal:  Positive for arthralgias,  back pain and gait problem. Negative for joint swelling and neck pain.  Skin:  Negative for rash.  Neurological:  Negative for tremors and numbness.  Hematological:  Negative for adenopathy. Does not bruise/bleed easily.  Psychiatric/Behavioral:  Negative for sleep disturbance and suicidal ideas. Behavioral problem: Depression.The patient is not nervous/anxious.     Vital Signs: BP (!) 144/92 Comment: 156/106  Pulse 83   Temp 98.1 F (36.7 C)   Resp 16   Ht 6\' 4"  (1.93 m)   Wt 178 lb 3.2 oz (80.8 kg)   SpO2 98%   BMI 21.69 kg/m    Physical Exam Vitals and nursing note reviewed.  Constitutional:      General: He is not in acute distress.    Appearance: He is well-developed and normal weight. He is not diaphoretic.     Comments: Appears disheveled  HENT:     Head: Normocephalic and atraumatic.     Right Ear: External ear normal.     Left Ear: External ear normal.     Nose: Nose normal.     Mouth/Throat:     Pharynx: No oropharyngeal exudate.  Eyes:     General: No scleral icterus.       Right eye: No discharge.        Left eye: No discharge.     Conjunctiva/sclera: Conjunctivae normal.     Pupils: Pupils are equal, round, and reactive to light.  Neck:     Thyroid: No thyromegaly.     Vascular: No JVD.     Trachea: No tracheal deviation.  Cardiovascular:     Rate and Rhythm: Normal rate and regular rhythm.     Heart sounds: Normal heart sounds. No murmur heard.   No friction rub. No gallop.  Pulmonary:     Effort: Pulmonary effort is normal. No respiratory distress.     Breath sounds: Normal breath sounds. No stridor. No wheezing or rales.  Chest:     Chest wall: No tenderness.  Abdominal:     General: Bowel sounds are normal. There is no distension.     Palpations: Abdomen is soft. There is no mass.     Tenderness: There is no abdominal tenderness. There is no guarding or rebound.  Musculoskeletal:        General: No tenderness or deformity. Normal range of  motion.     Cervical back: Normal range of motion and neck supple.  Lymphadenopathy:     Cervical: No cervical adenopathy.  Skin:    General: Skin is warm and dry.     Coloration: Skin is not pale.     Findings: No erythema or rash.  Neurological:     Mental Status: He is alert.     Cranial Nerves: No cranial nerve deficit.     Motor: No abnormal muscle tone.     Coordination: Coordination normal.  Gait: Gait abnormal.     Deep Tendon Reflexes: Reflexes are normal and symmetric.     Comments: Walks with rollator  Psychiatric:        Behavior: Behavior normal.        Thought Content: Thought content normal.        Judgment: Judgment normal.     LABS: Recent Results (from the past 2160 hour(s))  Ceruloplasmin     Status: None   Collection Time: 06/08/20  2:59 PM  Result Value Ref Range   Ceruloplasmin 29.3 16.0 - 31.0 mg/dL  Mitochondrial antibodies     Status: None   Collection Time: 06/08/20  2:59 PM  Result Value Ref Range   Mitochondrial Ab <20.0 0.0 - 20.0 Units    Comment:                                 Negative    0.0 - 20.0                                 Equivocal  20.1 - 24.9                                 Positive         >24.9 Mitochondrial (M2) Antibodies are found in 90-96% of patients with primary biliary cirrhosis.   Hepatitis B surface antibody,qualitative     Status: None   Collection Time: 06/08/20  2:59 PM  Result Value Ref Range   Hep B Surface Ab, Qual Non Reactive     Comment:               Non Reactive: Inconsistent with immunity,                             less than 10 mIU/mL               Reactive:     Consistent with immunity,                             greater than 9.9 mIU/mL   Hepatitis B surface antigen     Status: None   Collection Time: 06/08/20  2:59 PM  Result Value Ref Range   Hepatitis B Surface Ag Negative Negative  Anti-smooth muscle antibody, IgG     Status: None   Collection Time: 06/08/20  2:59 PM  Result Value Ref  Range   Smooth Muscle Ab 6 0 - 19 Units    Comment:                  Negative                     0 - 19                  Weak positive               20 - 30                  Moderate to strong positive     >30  Actin Antibodies are found in 52-85% of patients with  autoimmune hepatitis or chronic active hepatitis  and  in 22% of patients with primary biliary cirrhosis.   Alpha-1-antitrypsin     Status: None   Collection Time: 06/08/20  2:59 PM  Result Value Ref Range   A-1 Antitrypsin 181 101 - 187 mg/dL  Hepatitis A antibody, total     Status: None   Collection Time: 06/08/20  2:59 PM  Result Value Ref Range   hep A Total Ab Negative Negative  Hepatitis C genotype     Status: None   Collection Time: 06/08/20  2:59 PM  Result Value Ref Range   Hepatitis C Genotype 1a    Please Note (HCV): Comment     Comment: This test was developed and its performance characteristics determined by [facility].  It has not been cleared or approved by the U.S. Food and Drug Administration. The FDA has determined that such clearance or approval is not necessary. This test is used for clinical purposes.  It should not be regarded as investigational or for research.   Hepatic function panel     Status: None   Collection Time: 06/08/20  2:59 PM  Result Value Ref Range   Total Protein 6.2 6.0 - 8.5 g/dL   Albumin 3.8 3.8 - 4.9 g/dL   Bilirubin Total 0.3 0.0 - 1.2 mg/dL   Bilirubin, Direct 6.28 0.00 - 0.40 mg/dL   Alkaline Phosphatase 86 44 - 121 IU/L   AST 29 0 - 40 IU/L   ALT 25 0 - 44 IU/L  Iron and TIBC     Status: Abnormal   Collection Time: 06/08/20  2:59 PM  Result Value Ref Range   Total Iron Binding Capacity 389 250 - 450 ug/dL   UIBC 315 (H) 176 - 160 ug/dL   Iron 39 38 - 737 ug/dL   Iron Saturation 10 (L) 15 - 55 %  ANA     Status: None   Collection Time: 06/08/20  2:59 PM  Result Value Ref Range   Anti Nuclear Antibody (ANA) Negative Negative  Ferritin     Status: Abnormal    Collection Time: 06/08/20  2:59 PM  Result Value Ref Range   Ferritin 21 (L) 30 - 400 ng/mL  HCV FibroSURE     Status: None   Collection Time: 06/08/20  2:59 PM  Result Value Ref Range   Fibrosis Score 0.05 0.00 - 0.21   Fibrosis Stage Comment     Comment:                    F0 - No fibrosis   Necroinflammat Activity Score 0.11 0.00 - 0.17   Necroinflammat Activity Grade A0-No activity    ALPHA 2-MACROGLOBULINS, QN 167 110 - 276 mg/dL   Haptoglobin 106 29 - 370 mg/dL   Apolipoprotein A-1 269 101 - 178 mg/dL   Bilirubin, Total 0.1 0.0 - 1.2 mg/dL   GGT 32 0 - 65 IU/L   ALT (SGPT) P5P 30 0 - 55 IU/L   Interpretations: Comment     Comment: Quantitative results of 6 biochemical tests are analyzed using a computational algorithm to provide a quantitative surrogate marker (0.0-1.0) for liver fibrosis (METAVIR F0-F4) and for necroinflammatory activity (METAVIR A0-A3).    Fibrosis Scoring: Comment     Comment:      <=0.21 = Stage F0 - No fibrosis 0.21 - 0.27 = Stage F0 - F1 0.27 - 0.31 = Stage F1 - Portal fibrosis 0.31 - 0.48 = Stage F1 - F2 0.48 - 0.58 =  Stage F2 - Bridging fibrosis with few septa 0.58 - 0.72 = Stage F3 - Bridging fibrosis with many septa 0.72 - 0.74 = Stage F3 - F4       >0.74 = Stage F4 - Cirrhosis    Necroinflamm Activity Scoring: Comment     Comment:       <0.17 = Grade A0 - No Activity 0.17 - 0.29 = Grade A0 - A1 0.29 - 0.36 = Grade A1 - Minimal activity 0.36 - 0.52 = Grade A1 - A2 0.52 - 0.60 = Grade A2 - Moderate activity 0.60 - 0.62 = Grade A2 - A3       >0.62 = Grade A3 - Severe activity    Limitations: Comment     Comment: The negative predictive value of a Fibrotest score <0.31 (absence of clinically significant fibrosis) was 85% when compared to liver biopsy in 1,270 HCV infected patients with a 38% prevalence of significant liver fibrosis (F2, 3 or 4). The positive predictive value of a Fibro- test score >0.48 (F2, 3, 4) was 61% in that same  patient cohort. HCV FibroSURE is not recommended in patients with [place] Disease, acute hemolysis (e.g. HCV ribavirin therapy mediated hemolysis) acute hepa- titis of the liver, extra-hepatic cholestasis, transplant patients, and/or renal insufficiency patients.  Any of these clinical situations may lead to inaccurate quantitative predictions of fibrosis and necroinflammatory activity in the liver.    Comment Comment     Comment: This test was developed and its performance characteristics determined by [facility].  It has not been cleared or approved by the Food and Drug Administration.  The FDA has determined that such clearance or approval is not necessary. For questions regarding this report please contact customer service at [phone].   HCV RNA quant     Status: None   Collection Time: 06/08/20  2:59 PM  Result Value Ref Range   Hepatitis C Quantitation 1,250,000 IU/mL   HCV log10 6.097 log10 IU/mL   Test Information Comment     Comment: The quantitative range of this assay is 15 IU/mL to 100 million IU/mL.  UA/M w/rflx Culture, Routine     Status: None   Collection Time: 08/23/20  2:22 PM   Specimen: Urine   Urine  Result Value Ref Range   Specific Gravity, UA 1.006 1.005 - 1.030   pH, UA 6.5 5.0 - 7.5   Color, UA Yellow Yellow   Appearance Ur Clear Clear   Leukocytes,UA Negative Negative   Protein,UA Negative Negative/Trace   Glucose, UA Negative Negative   Ketones, UA Negative Negative   RBC, UA Negative Negative   Bilirubin, UA Negative Negative   Urobilinogen, Ur 0.2 0.2 - 1.0 mg/dL   Nitrite, UA Negative Negative   Microscopic Examination Comment     Comment: Microscopic follows if indicated.   Microscopic Examination See below:     Comment: Microscopic was indicated and was performed.   Urinalysis Reflex Comment     Comment: This specimen will not reflex to a Urine Culture.  Microscopic Examination     Status: None   Collection Time: 08/23/20  2:22 PM    Urine  Result Value Ref Range   WBC, UA None seen 0 - 5 /hpf   RBC None seen 0 - 2 /hpf   Epithelial Cells (non renal) None seen 0 - 10 /hpf   Casts None seen None seen /lpf   Bacteria, UA None seen None seen/Few  Assessment/Plan: 1. Encounter for general adult medical examination with abnormal findings CPE performed, routine fasting labs ordered and patient will reschedule his colonoscopy with GI  2. Essential hypertension Elevated in office however patient is unsure whether he took his medication today and does admit to eating salty foods.  Educated on the importance of low-salt diet and adhering to medications and advised that he obtain a blood pressure cuff or stop by the pharmacy to check his blood pressure as able.  We will continue with current medications  3. Mixed hyperlipidemia Continue Lipitor and will update labs - Lipid Panel With LDL/HDL Ratio  4. Severe recurrent major depression without psychotic features (HCC) Continue Prozac  5. Chronic midline low back pain without sciatica May continue tramadol as needed - traMADol (ULTRAM) 50 MG tablet; One tab po tid for pain  Dispense: 90 tablet; Refill: 1  6. Cigarette nicotine dependence with nicotine-induced disorder Will start on Wellbutrin to aid in smoking cessation Smoking cessation counseling: Pt acknowledges the risks of long term smoking, she will try to quite smoking. Options for different medications including nicotine products, chewing gum, patch etc, Wellbutrin and Chantix is discussed Goal and date of compete cessation is discussed Total time spent in smoking cessation is 10 min.  - buPROPion (WELLBUTRIN XL) 150 MG 24 hr tablet; Take 1 tablet (150 mg total) by mouth daily.  Dispense: 30 tablet; Refill: 2  7. Encounter for eye exam Due to pain and hypertension will refer for eye exam especially since it has been years since he had an eye exam - Ambulatory referral to Ophthalmology  8. Chronic  hepatitis C without hepatic coma (HCC) Followed by GI  9. Dysuria - UA/M w/rflx Culture, Routine  10. Other fatigue - CBC w/Diff/Platelet - Comprehensive metabolic panel - TSH + free T4  11. Thryoid nodule -Previous bx was benign and followed by endocrinology  General Counseling: Lenora BoysFred verbalizes understanding of the findings of todays visit and agrees with plan of treatment. I have discussed any further diagnostic evaluation that may be needed or ordered today. We also reviewed his medications today. he has been encouraged to call the office with any questions or concerns that should arise related to todays visit.    Counseling:    Orders Placed This Encounter  Procedures   Microscopic Examination   UA/M w/rflx Culture, Routine   CBC w/Diff/Platelet   Comprehensive metabolic panel   Lipid Panel With LDL/HDL Ratio   TSH + free T4   Ambulatory referral to Ophthalmology     Meds ordered this encounter  Medications   buPROPion (WELLBUTRIN XL) 150 MG 24 hr tablet    Sig: Take 1 tablet (150 mg total) by mouth daily.    Dispense:  30 tablet    Refill:  2   SPIRIVA HANDIHALER 18 MCG inhalation capsule    Sig: Place 1 capsule (18 mcg total) into inhaler and inhale daily.    Dispense:  30 capsule    Refill:  2   traMADol (ULTRAM) 50 MG tablet    Sig: One tab po tid for pain    Dispense:  90 tablet    Refill:  1     This patient was seen by Lynn ItoLauren Thailan Sava, PA-C in collaboration with Dr. Beverely RisenFozia Khan as a part of collaborative care agreement.  Total time spent:40 Minutes  Time spent includes review of chart, medications, test results, and follow up plan with the patient. Addressed multiple comorbidities requiring critical thinking.  Lavera Guise, MD  Internal Medicine

## 2020-08-23 NOTE — Telephone Encounter (Signed)
Referral faxed to [facility]

## 2020-08-24 LAB — MICROSCOPIC EXAMINATION
Bacteria, UA: NONE SEEN
Casts: NONE SEEN /lpf
Epithelial Cells (non renal): NONE SEEN /hpf (ref 0–10)
RBC, Urine: NONE SEEN /hpf (ref 0–2)
WBC, UA: NONE SEEN /hpf (ref 0–5)

## 2020-08-24 LAB — UA/M W/RFLX CULTURE, ROUTINE
Bilirubin, UA: NEGATIVE
Glucose, UA: NEGATIVE
Ketones, UA: NEGATIVE
Leukocytes,UA: NEGATIVE
Nitrite, UA: NEGATIVE
Protein,UA: NEGATIVE
RBC, UA: NEGATIVE
Specific Gravity, UA: 1.006 (ref 1.005–1.030)
Urobilinogen, Ur: 0.2 mg/dL (ref 0.2–1.0)
pH, UA: 6.5 (ref 5.0–7.5)

## 2020-08-25 ENCOUNTER — Telehealth: Payer: Self-pay

## 2020-08-25 ENCOUNTER — Other Ambulatory Visit: Payer: Self-pay

## 2020-08-25 DIAGNOSIS — F17219 Nicotine dependence, cigarettes, with unspecified nicotine-induced disorders: Secondary | ICD-10-CM

## 2020-08-25 MED ORDER — UMECLIDINIUM BROMIDE 62.5 MCG/INH IN AEPB: 1.0000 | INHALATION_SPRAY | Freq: Every day | RESPIRATORY_TRACT | 3 refills | Status: DC

## 2020-08-25 NOTE — Telephone Encounter (Signed)
Spiriva was not covered on pt's insurance and pharmacy let us know and asked if we can change to incruse, per Lauren it was ok to change

## 2020-08-26 ENCOUNTER — Ambulatory Visit: Payer: Medicare HMO | Admitting: Physician Assistant

## 2020-08-26 ENCOUNTER — Telehealth: Payer: Self-pay
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
## 2020-09-30 ENCOUNTER — Other Ambulatory Visit: Payer: Self-pay | Admitting: Internal Medicine

## 2020-09-30 DIAGNOSIS — I10 Essential (primary) hypertension: Secondary | ICD-10-CM

## 2020-10-08 ENCOUNTER — Telehealth: Payer: Self-pay

## 2020-10-08 NOTE — Telephone Encounter (Signed)
[facility] Case Communication signed and faxed back to [phone]. Sent to be scanned-Toni

## 2020-11-25 ENCOUNTER — Other Ambulatory Visit: Payer: Self-pay

## 2020-11-25 ENCOUNTER — Encounter: Payer: Self-pay | Admitting: Physician Assistant

## 2020-11-25 ENCOUNTER — Ambulatory Visit (INDEPENDENT_AMBULATORY_CARE_PROVIDER_SITE_OTHER): Payer: Medicare HMO | Admitting: Physician Assistant

## 2020-11-25 ENCOUNTER — Telehealth: Payer: Self-pay

## 2020-11-25 VITALS — BP 110/70 | HR 76 | Temp 98.0°F | Resp 16 | Ht 76.0 in | Wt 179.0 lb

## 2020-11-25 DIAGNOSIS — G8929 Other chronic pain: Secondary | ICD-10-CM

## 2020-11-25 DIAGNOSIS — F332 Major depressive disorder, recurrent severe without psychotic features: Secondary | ICD-10-CM

## 2020-11-25 DIAGNOSIS — F17219 Nicotine dependence, cigarettes, with unspecified nicotine-induced disorders: Secondary | ICD-10-CM

## 2020-11-25 DIAGNOSIS — M545 Low back pain, unspecified: Secondary | ICD-10-CM

## 2020-11-25 DIAGNOSIS — E782 Mixed hyperlipidemia: Secondary | ICD-10-CM

## 2020-11-25 DIAGNOSIS — B182 Chronic viral hepatitis C: Secondary | ICD-10-CM

## 2020-11-25 DIAGNOSIS — I10 Essential (primary) hypertension: Secondary | ICD-10-CM | POA: Diagnosis not present

## 2020-11-25 NOTE — Telephone Encounter (Signed)
Patient disability parking placard signed by provider and given to patient at appointment.

## 2020-11-25 NOTE — Progress Notes (Signed)
[facility] [address] [place], [place] [address]  Internal MEDICINE  Office Visit Note  Patient Name: Gerald Martin  [id number]  [id number]  Date of Service: 11/30/2020  Chief Complaint  Patient presents with   Follow-up   Hyperlipidemia   Hypertension    HPI Pt is here for routine follow up  -Has not drunk any water and eaten very little.  He has a dry throat and is very tired today. Does not feel lightheaded,but does feel like he wants to go back to bed. Did stay up late last night as well. -Continues smoking 1ppd.  -Did take both BP pills today and BP initially low in office, however did improve on recheck after drinking water.  -Does not have a bp cuff and will try to get one/check at pharmacy. May need to decrease BP pills if continues to be low, though last visit was elevated. -Eyes started bothering him awhile ago, has some left eye pain and was given some drops and he lost them so went and got something OTC and not sure they are helping. Also was given new glasses but they seem to bother him. Will follow back up with eye doctor regarding this. -Denies any headache right now.  -did not have labs done  Current Medication: Outpatient Encounter Medications as of 11/25/2020  Medication Sig   amLODipine (NORVASC) 10 MG tablet Take 1 tablet (10 mg total) by mouth daily.   ASPIRIN LOW DOSE 81 MG EC tablet TAKE 1 TABLET BY MOUTH ONCE DAILY   atorvastatin (LIPITOR) 40 MG tablet TAKE 1 TABLET BY MOUTH ONCE DAILY AT 6:00PM   carvedilol (COREG) 6.25 MG tablet TAKE 1 TABLET BY MOUTH TWICE DAILY WITH A MEAL   COMBIVENT RESPIMAT 20-100 MCG/ACT AERS respimat Inhale 1 puff into the lungs 4 (four) times daily.   FLUoxetine (PROZAC) 40 MG capsule TAKE 1 CAPSULE BY MOUTH DAILY   Ipratropium-Albuterol (COMBIVENT RESPIMAT) 20-100 MCG/ACT AERS respimat Inhale 1 puff into the lungs 4 (four) times daily.   meclizine (ANTIVERT) 25 MG tablet Take 1 tablet (25 mg total) by mouth 3  (three) times daily as needed for dizziness.   traMADol (ULTRAM) 50 MG tablet One tab po tid for pain   traZODone (DESYREL) 50 MG tablet TAKE 1 TABLET BY MOUTH AT BEDTIME   [DISCONTINUED] buPROPion (WELLBUTRIN XL) 150 MG 24 hr tablet Take 1 tablet (150 mg total) by mouth daily.   [DISCONTINUED] clopidogrel (PLAVIX) 75 MG tablet TAKE 1 TABLET BY MOUTH ONCE DAILY   [DISCONTINUED] lisinopril (ZESTRIL) 40 MG tablet Take 1 tablet (40 mg total) by mouth daily.   [DISCONTINUED] omeprazole (PRILOSEC) 20 MG capsule Take 1 capsule (20 mg total) by mouth daily.   [DISCONTINUED] umeclidinium bromide (INCRUSE ELLIPTA) 62.5 MCG/INH AEPB Inhale 1 puff into the lungs daily.   No facility-administered encounter medications on file as of 11/25/2020.    Surgical History: Past Surgical History:  Procedure Laterality Date   arm surgery[Other] Left    chain saw injury   chronic pain     LEG SURGERY Right     Medical History: Past Medical History:  Diagnosis Date   Anxiety    Chronic pain    back   COPD (chronic obstructive pulmonary disease) (HCC)    Depression    GERD (gastroesophageal reflux disease)    Hyperlipidemia    Hypertension    Stroke [facility])     Family History: Family History  Problem Relation Age of Onset  Stroke Mother    Diabetes Father     Social History   Socioeconomic History   Marital status: Single    Spouse name: Not on file   Number of children: Not on file   Years of education: Not on file   Highest education level: Not on file  Occupational History   Not on file  Tobacco Use   Smoking status: Every Day    Packs/day: 1.00    Years: 30.00    Pack years: 30.00    Types: Cigarettes   Smokeless tobacco: Never  Vaping Use   Vaping Use: Never used  Substance and Sexual Activity   Alcohol use: Not Currently   Drug use: Never   Sexual activity: Not on file  Other Topics Concern   Not on file  Social History Narrative   ** Merged History Encounter **        Social Determinants of Health   Financial Resource Strain: Not on file  Food Insecurity: Not on file  Transportation Needs: Not on file  Physical Activity: Not on file  Stress: Not on file  Social Connections: Not on file  Intimate Partner Violence: Not on file      Review of Systems  Constitutional:  Positive for fatigue. Negative for chills and unexpected weight change.  HENT:  Negative for congestion, postnasal drip, rhinorrhea, sneezing and sore throat.   Eyes:  Positive for pain. Negative for redness.  Respiratory:  Negative for cough, chest tightness and shortness of breath.   Cardiovascular:  Negative for chest pain and palpitations.  Gastrointestinal:  Negative for abdominal pain, constipation, diarrhea, nausea and vomiting.  Genitourinary:  Negative for dysuria and frequency.  Musculoskeletal:  Positive for arthralgias, back pain and gait problem. Negative for joint swelling and neck pain.  Skin:  Negative for rash.  Neurological:  Negative for tremors and numbness.  Hematological:  Negative for adenopathy. Does not bruise/bleed easily.  Psychiatric/Behavioral:  Negative for sleep disturbance and suicidal ideas. Behavioral problem: Depression.The patient is not nervous/anxious.    Vital Signs: BP 110/70   Pulse 76   Temp 98 F (36.7 C)   Resp 16   Ht 6\' 4"  (1.93 m)   Wt 179 lb (81.2 kg)   SpO2 99%   BMI 21.79 kg/m    Physical Exam Vitals and nursing note reviewed.  Constitutional:      General: He is not in acute distress.    Appearance: He is well-developed and normal weight. He is not diaphoretic.  HENT:     Head: Normocephalic and atraumatic.     Mouth/Throat:     Pharynx: No oropharyngeal exudate.  Eyes:     Pupils: Pupils are equal, round, and reactive to light.  Neck:     Thyroid: No thyromegaly.     Vascular: No JVD.     Trachea: No tracheal deviation.  Cardiovascular:     Rate and Rhythm: Normal rate and regular rhythm.     Heart sounds:  Normal heart sounds. No murmur heard.   No friction rub. No gallop.  Pulmonary:     Effort: Pulmonary effort is normal. No respiratory distress.     Breath sounds: No wheezing or rales.  Chest:     Chest wall: No tenderness.  Abdominal:     General: Bowel sounds are normal.     Palpations: Abdomen is soft.  Musculoskeletal:        General: Normal range of motion.     Cervical  back: Normal range of motion and neck supple.  Lymphadenopathy:     Cervical: No cervical adenopathy.  Skin:    General: Skin is warm and dry.  Neurological:     Mental Status: He is alert and oriented to person, place, and time.     Cranial Nerves: No cranial nerve deficit.     Gait: Gait abnormal.     Comments: Walks with rollator  Psychiatric:        Behavior: Behavior normal.        Thought Content: Thought content normal.        Judgment: Judgment normal.       Assessment/Plan: 1. Essential hypertension Will have patient monitor BP and may need to lower meds if BP continues to stay low. Advised to increase water intake.  2. Mixed hyperlipidemia Continue lipitor, advised to have previous labs done  3. Severe recurrent major depression without psychotic features (HCC) Continue prozac  4. Chronic midline low back pain without sciatica Ma continue tramadol as needed  5. Cigarette nicotine dependence with nicotine-induced disorder Smoking cessation counseling: Pt acknowledges the risks of long term smoking, she will try to quite smoking. Options for different medications including nicotine products, chewing gum, patch etc, Wellbutrin and Chantix is discussed Goal and date of compete cessation is discussed Total time spent in smoking cessation is 10 min.   6. Chronic hepatitis C without hepatic coma (HCC) Followed by GI   General Counseling: Gerald Martin understanding of the findings of todays visit and agrees with plan of treatment. I have discussed any further diagnostic evaluation that  may be needed or ordered today. We also reviewed his medications today. he has been encouraged to call the office with any questions or concerns that should arise related to todays visit.    No orders of the defined types were placed in this encounter.   No orders of the defined types were placed in this encounter.   This patient was seen by Lynn Ito, PA-C in collaboration with Dr. Beverely Risen as a part of collaborative care agreement.   Total time spent:35 Minutes Time spent includes review of chart, medications, test results, and follow up plan with the patient.      Dr Lyndon Code Internal medicine

## 2020-11-29 ENCOUNTER — Other Ambulatory Visit: Payer: Self-pay | Admitting: Internal Medicine

## 2020-11-29 ENCOUNTER — Other Ambulatory Visit: Payer: Self-pay | Admitting: Physician Assistant

## 2020-11-29 DIAGNOSIS — F17219 Nicotine dependence, cigarettes, with unspecified nicotine-induced disorders: Secondary | ICD-10-CM

## 2020-11-29 DIAGNOSIS — I6340 Cerebral infarction due to embolism of unspecified cerebral artery: Secondary | ICD-10-CM

## 2020-12-14 ENCOUNTER — Other Ambulatory Visit: Payer: Self-pay | Admitting: Physician Assistant

## 2020-12-14 DIAGNOSIS — G8929 Other chronic pain: Secondary | ICD-10-CM

## 2020-12-14 DIAGNOSIS — M545 Low back pain, unspecified: Secondary | ICD-10-CM

## 2021-01-19 ENCOUNTER — Other Ambulatory Visit: Payer: Self-pay | Admitting: Internal Medicine

## 2021-01-19 ENCOUNTER — Other Ambulatory Visit: Payer: Self-pay | Admitting: Physician Assistant

## 2021-01-19 DIAGNOSIS — I6340 Cerebral infarction due to embolism of unspecified cerebral artery: Secondary | ICD-10-CM

## 2021-01-19 DIAGNOSIS — E782 Mixed hyperlipidemia: Secondary | ICD-10-CM

## 2021-01-19 DIAGNOSIS — F17219 Nicotine dependence, cigarettes, with unspecified nicotine-induced disorders: Secondary | ICD-10-CM

## 2021-01-19 DIAGNOSIS — I10 Essential (primary) hypertension: Secondary | ICD-10-CM

## 2021-01-25 ENCOUNTER — Other Ambulatory Visit: Payer: Self-pay | Admitting: Physician Assistant

## 2021-01-25 DIAGNOSIS — M545 Low back pain, unspecified: Secondary | ICD-10-CM

## 2021-01-25 DIAGNOSIS — G8929 Other chronic pain: Secondary | ICD-10-CM

## 2021-02-21 ENCOUNTER — Other Ambulatory Visit: Payer: Self-pay

## 2021-02-21 ENCOUNTER — Ambulatory Visit (INDEPENDENT_AMBULATORY_CARE_PROVIDER_SITE_OTHER): Payer: Medicare HMO | Admitting: Physician Assistant

## 2021-02-21 ENCOUNTER — Encounter: Payer: Self-pay | Admitting: Physician Assistant

## 2021-02-21 VITALS — BP 128/83 | HR 66 | Temp 98.0°F | Resp 16 | Ht 76.0 in | Wt 186.2 lb

## 2021-02-21 DIAGNOSIS — M545 Low back pain, unspecified: Secondary | ICD-10-CM

## 2021-02-21 DIAGNOSIS — F17219 Nicotine dependence, cigarettes, with unspecified nicotine-induced disorders: Secondary | ICD-10-CM

## 2021-02-21 DIAGNOSIS — F332 Major depressive disorder, recurrent severe without psychotic features: Secondary | ICD-10-CM | POA: Diagnosis not present

## 2021-02-21 DIAGNOSIS — I10 Essential (primary) hypertension: Secondary | ICD-10-CM

## 2021-02-21 DIAGNOSIS — B182 Chronic viral hepatitis C: Secondary | ICD-10-CM

## 2021-02-21 DIAGNOSIS — E782 Mixed hyperlipidemia: Secondary | ICD-10-CM

## 2021-02-21 DIAGNOSIS — G8929 Other chronic pain: Secondary | ICD-10-CM

## 2021-02-21 NOTE — Progress Notes (Signed)
[facility] [address] [place], [place] [address]  Internal MEDICINE  Office Visit Note  Patient Name: Gerald Martin  [id number]  [id number]  Date of Service: 02/23/2021  Chief Complaint  Patient presents with   Follow-up   Depression   Gastroesophageal Reflux   Hyperlipidemia   Hypertension   Ear Pain    Ear and Eye pain on the left side - started about a week ago    HPI Pt is here for routine follow up -left eye has been bothering him for awhile, now his left ear is bothering him, but is clear on exam -Will start using some systane eye drops, again advised to see eye doctor -Bp well controlled, has a nurse that comes out to check on him occasionally. Has an aid a few days per week. -smoking 1-2 ppd -did not have labs done -will try some mucinex if congested -never had colonoscopy, needs to f/u with GI for monitor hep C as well -tramadol not controlling pain completely and requests a referral to pain management  Current Medication: Outpatient Encounter Medications as of 02/21/2021  Medication Sig   amLODipine (NORVASC) 10 MG tablet Take 1 tablet (10 mg total) by mouth daily.   ASPIRIN LOW DOSE 81 MG EC tablet TAKE 1 TABLET BY MOUTH ONCE DAILY   atorvastatin (LIPITOR) 40 MG tablet TAKE 1 TABLET BY MOUTH ONCE DAILY AT 6:00PM   buPROPion (WELLBUTRIN XL) 150 MG 24 hr tablet TAKE 1 TABLET BY MOUTH ONCE DAILY   carvedilol (COREG) 6.25 MG tablet TAKE 1 TABLET BY MOUTH TWICE DAILY WITH A MEAL   clopidogrel (PLAVIX) 75 MG tablet TAKE 1 TABLET BY MOUTH ONCE DAILY   COMBIVENT RESPIMAT 20-100 MCG/ACT AERS respimat Inhale 1 puff into the lungs 4 (four) times daily.   FLUoxetine (PROZAC) 40 MG capsule TAKE 1 CAPSULE BY MOUTH DAILY   INCRUSE ELLIPTA 62.5 MCG/ACT AEPB INHALE 1 PUFF INTO THE LUNGS ONCE DAILY.*RINSE MOUTH AFTER USE AS DIRECTED   Ipratropium-Albuterol (COMBIVENT RESPIMAT) 20-100 MCG/ACT AERS respimat Inhale 1 puff into the lungs 4 (four) times daily.    lisinopril (ZESTRIL) 40 MG tablet TAKE 1 TABLET BY MOUTH ONCE DAILY   meclizine (ANTIVERT) 25 MG tablet Take 1 tablet (25 mg total) by mouth 3 (three) times daily as needed for dizziness.   omeprazole (PRILOSEC) 20 MG capsule TAKE 1 CAPSULE BY MOUTH ONCE DAILY   traMADol (ULTRAM) 50 MG tablet TAKE 1 TABLET BY MOUTH 3 TIMES DAILY FORPAIN   traZODone (DESYREL) 50 MG tablet TAKE 1 TABLET BY MOUTH AT BEDTIME   No facility-administered encounter medications on file as of 02/21/2021.    Surgical History: Past Surgical History:  Procedure Laterality Date   arm surgery[Other] Left    chain saw injury   chronic pain     LEG SURGERY Right     Medical History: Past Medical History:  Diagnosis Date   Anxiety    Chronic pain    back   COPD (chronic obstructive pulmonary disease) (HCC)    Depression    GERD (gastroesophageal reflux disease)    Hyperlipidemia    Hypertension    Stroke [facility])     Family History: Family History  Problem Relation Age of Onset   Stroke Mother    Diabetes Father     Social History   Socioeconomic History   Marital status: Single    Spouse name: Not on file   Number of children: Not on file   Years  of education: Not on file   Highest education level: Not on file  Occupational History   Not on file  Tobacco Use   Smoking status: Every Day    Packs/day: 2.00    Years: 30.00    Pack years: 60.00    Types: Cigarettes   Smokeless tobacco: Never   Tobacco comments:    Smokes 2 packs+ a day  Vaping Use   Vaping Use: Never used  Substance and Sexual Activity   Alcohol use: Not Currently   Drug use: Never   Sexual activity: Not on file  Other Topics Concern   Not on file  Social History Narrative   ** Merged History Encounter **       Social Determinants of Health   Financial Resource Strain: Not on file  Food Insecurity: Not on file  Transportation Needs: Not on file  Physical Activity: Not on file  Stress: Not on file  Social  Connections: Not on file  Intimate Partner Violence: Not on file      Review of Systems  Constitutional:  Positive for fatigue. Negative for chills and unexpected weight change.  HENT:  Negative for congestion, postnasal drip, rhinorrhea, sneezing and sore throat.   Eyes:  Positive for pain. Negative for redness.  Respiratory:  Negative for cough, chest tightness and shortness of breath.   Cardiovascular:  Negative for chest pain and palpitations.  Gastrointestinal:  Negative for abdominal pain, constipation, diarrhea, nausea and vomiting.  Genitourinary:  Negative for dysuria and frequency.  Musculoskeletal:  Positive for arthralgias, back pain and gait problem. Negative for joint swelling and neck pain.  Skin:  Negative for rash.  Neurological:  Negative for tremors and numbness.  Hematological:  Negative for adenopathy. Does not bruise/bleed easily.  Psychiatric/Behavioral:  Negative for sleep disturbance and suicidal ideas. Behavioral problem: Depression.The patient is not nervous/anxious.    Vital Signs: BP 128/83    Pulse 66    Temp 98 F (36.7 C)    Resp 16    Ht 6\' 4"  (1.93 m)    Wt 186 lb 3.2 oz (84.5 kg)    SpO2 96%    BMI 22.66 kg/m    Physical Exam Vitals and nursing note reviewed.  Constitutional:      General: He is not in acute distress.    Appearance: He is well-developed and normal weight. He is not diaphoretic.  HENT:     Head: Normocephalic and atraumatic.     Mouth/Throat:     Pharynx: No oropharyngeal exudate.  Eyes:     Pupils: Pupils are equal, round, and reactive to light.  Neck:     Thyroid: No thyromegaly.     Vascular: No JVD.     Trachea: No tracheal deviation.  Cardiovascular:     Rate and Rhythm: Normal rate and regular rhythm.     Heart sounds: Normal heart sounds. No murmur heard.   No friction rub. No gallop.  Pulmonary:     Effort: Pulmonary effort is normal. No respiratory distress.     Breath sounds: No wheezing or rales.  Chest:      Chest wall: No tenderness.  Abdominal:     General: Bowel sounds are normal.     Palpations: Abdomen is soft.  Musculoskeletal:        General: Normal range of motion.     Cervical back: Normal range of motion and neck supple.  Lymphadenopathy:     Cervical: No cervical adenopathy.  Skin:  General: Skin is warm and dry.  Neurological:     Mental Status: He is alert and oriented to person, place, and time.     Cranial Nerves: No cranial nerve deficit.     Gait: Gait abnormal.     Comments: Walks with rollator  Psychiatric:        Behavior: Behavior normal.        Thought Content: Thought content normal.        Judgment: Judgment normal.       Assessment/Plan: 1. Chronic midline low back pain without sciatica States tramadol is not controlling pain and requests pain management referral - Ambulatory referral to Pain Clinic  2. Essential hypertension Stable, continue current medications  3. Cigarette nicotine dependence with nicotine-induced disorder Smoking cessation counseling: Pt acknowledges the risks of long term smoking, she will try to quite smoking. Options for different medications including nicotine products, chewing gum, patch etc, Wellbutrin and Chantix is discussed Goal and date of compete cessation is discussed Total time spent in smoking cessation is 10 min.   4. Severe recurrent major depression without psychotic features (HCC) Continue prozac  5. Mixed hyperlipidemia Continue lipitor, again advised to have fasting labs done  6. Chronic hepatitis C without hepatic coma (HCC) Needs to contact GI for follow up and possibly colonoscopy   General Counseling: Lenora Boys understanding of the findings of todays visit and agrees with plan of treatment. I have discussed any further diagnostic evaluation that may be needed or ordered today. We also reviewed his medications today. he has been encouraged to call the office with any questions or concerns that  should arise related to todays visit.    Orders Placed This Encounter  Procedures   Ambulatory referral to Pain Clinic    No orders of the defined types were placed in this encounter.   This patient was seen by Lynn Ito, PA-C in collaboration with Dr. Beverely Risen as a part of collaborative care agreement.   Total time spent:30 Minutes Time spent includes review of chart, medications, test results, and follow up plan with the patient.      Dr Lyndon Code Internal medicine

## 2021-02-22 ENCOUNTER — Telehealth: Payer: Self-pay

## 2021-02-22 NOTE — Telephone Encounter (Signed)
Pain management referral sent via Epic to [facility]

## 2021-03-17 ENCOUNTER — Ambulatory Visit (INDEPENDENT_AMBULATORY_CARE_PROVIDER_SITE_OTHER): Payer: Medicare HMO | Admitting: Physician Assistant

## 2021-03-17 ENCOUNTER — Other Ambulatory Visit: Payer: Self-pay

## 2021-03-17 ENCOUNTER — Encounter: Payer: Self-pay | Admitting: Physician Assistant

## 2021-03-17 VITALS — BP 124/82 | HR 75 | Temp 98.3°F | Resp 16 | Ht 76.0 in | Wt 179.0 lb

## 2021-03-17 DIAGNOSIS — B351 Tinea unguium: Secondary | ICD-10-CM | POA: Diagnosis not present

## 2021-03-17 NOTE — Progress Notes (Signed)
[facility] [address] [place], [place] [address]  Internal MEDICINE  Office Visit Note  Patient Name: Gerald Martin  [id number]  [id number]  Date of Service: 03/25/2021  Chief Complaint  Patient presents with   Foot Pain    Nail is turning black on right foot, big toe     HPI Pt is here for a sick visit. -Nails on both big toes are green-black -States someone told him he needed to have this checked to make sure nothing serious going on such as acute infection -Unclear exactly how long this has been going on, but suspect for some time -Perfusion is good   Current Medication:  Outpatient Encounter Medications as of 03/17/2021  Medication Sig   ASPIRIN LOW DOSE 81 MG EC tablet TAKE 1 TABLET BY MOUTH ONCE DAILY   atorvastatin (LIPITOR) 40 MG tablet TAKE 1 TABLET BY MOUTH ONCE DAILY AT 6:00PM   buPROPion (WELLBUTRIN XL) 150 MG 24 hr tablet TAKE 1 TABLET BY MOUTH ONCE DAILY   COMBIVENT RESPIMAT 20-100 MCG/ACT AERS respimat Inhale 1 puff into the lungs 4 (four) times daily.   INCRUSE ELLIPTA 62.5 MCG/ACT AEPB INHALE 1 PUFF INTO THE LUNGS ONCE DAILY.*RINSE MOUTH AFTER USE AS DIRECTED   Ipratropium-Albuterol (COMBIVENT RESPIMAT) 20-100 MCG/ACT AERS respimat Inhale 1 puff into the lungs 4 (four) times daily.   lisinopril (ZESTRIL) 40 MG tablet TAKE 1 TABLET BY MOUTH ONCE DAILY   meclizine (ANTIVERT) 25 MG tablet Take 1 tablet (25 mg total) by mouth 3 (three) times daily as needed for dizziness.   omeprazole (PRILOSEC) 20 MG capsule TAKE 1 CAPSULE BY MOUTH ONCE DAILY   traMADol (ULTRAM) 50 MG tablet TAKE 1 TABLET BY MOUTH 3 TIMES DAILY FORPAIN   [DISCONTINUED] amLODipine (NORVASC) 10 MG tablet Take 1 tablet (10 mg total) by mouth daily.   [DISCONTINUED] carvedilol (COREG) 6.25 MG tablet TAKE 1 TABLET BY MOUTH TWICE DAILY WITH A MEAL   [DISCONTINUED] clopidogrel (PLAVIX) 75 MG tablet TAKE 1 TABLET BY MOUTH ONCE DAILY   [DISCONTINUED] FLUoxetine (PROZAC) 40 MG capsule  TAKE 1 CAPSULE BY MOUTH DAILY   [DISCONTINUED] traZODone (DESYREL) 50 MG tablet TAKE 1 TABLET BY MOUTH AT BEDTIME   No facility-administered encounter medications on file as of 03/17/2021.      Medical History: Past Medical History:  Diagnosis Date   Anxiety    Chronic pain    back   COPD (chronic obstructive pulmonary disease) (HCC)    Depression    GERD (gastroesophageal reflux disease)    Hyperlipidemia    Hypertension    Stroke (HCC)      Vital Signs: BP 124/82    Pulse 75    Temp 98.3 F (36.8 C)    Resp 16    Ht 6\' 4"  (1.93 m)    Wt 179 lb (81.2 kg)    SpO2 96%    BMI 21.79 kg/m    Review of Systems  Constitutional:  Negative for fatigue and fever.  HENT:  Negative for congestion, mouth sores and postnasal drip.   Respiratory:  Negative for cough.   Cardiovascular:  Negative for chest pain.  Genitourinary:  Negative for flank pain.  Skin:  Positive for color change.       Black toenails  Psychiatric/Behavioral: Negative.     Physical Exam Constitutional:      General: He is not in acute distress.    Appearance: He is well-developed. He is not diaphoretic.  HENT:  Head: Normocephalic and atraumatic.     Mouth/Throat:     Pharynx: No oropharyngeal exudate.  Eyes:     Pupils: Pupils are equal, round, and reactive to light.  Neck:     Thyroid: No thyromegaly.     Vascular: No JVD.     Trachea: No tracheal deviation.  Cardiovascular:     Rate and Rhythm: Normal rate and regular rhythm.     Pulses:          Dorsalis pedis pulses are 2+ on the right side and 2+ on the left side.       Posterior tibial pulses are 2+ on the right side and 2+ on the left side.     Heart sounds: Normal heart sounds. No murmur heard.   No friction rub. No gallop.  Pulmonary:     Effort: Pulmonary effort is normal. No respiratory distress.     Breath sounds: No wheezing or rales.  Chest:     Chest wall: No tenderness.  Abdominal:     General: Bowel sounds are normal.      Palpations: Abdomen is soft.  Musculoskeletal:        General: Normal range of motion.     Cervical back: Normal range of motion and neck supple.  Feet:     Right foot:     Skin integrity: Dry skin present. No ulcer or erythema.     Toenail Condition: Right toenails are abnormally thick and long. Fungal disease present.    Left foot:     Skin integrity: Dry skin present. No ulcer or erythema.     Toenail Condition: Left toenails are abnormally thick and long. Fungal disease present. Lymphadenopathy:     Cervical: No cervical adenopathy.  Skin:    General: Skin is warm and dry.  Neurological:     Mental Status: He is alert and oriented to person, place, and time.     Cranial Nerves: No cranial nerve deficit.  Psychiatric:        Behavior: Behavior normal.        Thought Content: Thought content normal.        Judgment: Judgment normal.      Assessment/Plan: 1. Onychomycosis of great toe Will refer to podiatry for further evaluation and management.  Patient will need management of nails, in addition to likely oral terbinafine treatment, and was educated on the need to keep toenails clipped.  Patient should call if any signs of acute skin infections develop - Ambulatory referral to Podiatry   General Counseling: Gerald Martin understanding of the findings of todays visit and agrees with plan of treatment. I have discussed any further diagnostic evaluation that may be needed or ordered today. We also reviewed his medications today. he has been encouraged to call the office with any questions or concerns that should arise related to todays visit.    Counseling:    Orders Placed This Encounter  Procedures   Ambulatory referral to Podiatry    No orders of the defined types were placed in this encounter.   Time spent:30 Minutes

## 2021-03-23 ENCOUNTER — Other Ambulatory Visit: Payer: Self-pay | Admitting: Physician Assistant

## 2021-03-23 ENCOUNTER — Other Ambulatory Visit: Payer: Self-pay | Admitting: Internal Medicine

## 2021-03-23 DIAGNOSIS — I161 Hypertensive emergency: Secondary | ICD-10-CM

## 2021-03-23 DIAGNOSIS — F332 Major depressive disorder, recurrent severe without psychotic features: Secondary | ICD-10-CM

## 2021-03-23 DIAGNOSIS — I10 Essential (primary) hypertension: Secondary | ICD-10-CM

## 2021-03-23 DIAGNOSIS — I6340 Cerebral infarction due to embolism of unspecified cerebral artery: Secondary | ICD-10-CM

## 2021-03-25 ENCOUNTER — Other Ambulatory Visit: Payer: Self-pay | Admitting: Physician Assistant

## 2021-03-25 DIAGNOSIS — M545 Low back pain, unspecified: Secondary | ICD-10-CM

## 2021-03-25 DIAGNOSIS — G8929 Other chronic pain: Secondary | ICD-10-CM

## 2021-03-28 ENCOUNTER — Ambulatory Visit: Payer: Medicare HMO | Admitting: Podiatry

## 2021-03-30 ENCOUNTER — Ambulatory Visit: Payer: Medicare HMO | Admitting: Podiatry

## 2021-04-04 ENCOUNTER — Telehealth: Payer: Self-pay

## 2021-04-04 NOTE — Telephone Encounter (Signed)
Home health order signed. Faxed back to [phone] ?

## 2021-04-11 ENCOUNTER — Encounter: Payer: Self-pay | Admitting: Podiatry

## 2021-04-11 ENCOUNTER — Ambulatory Visit (INDEPENDENT_AMBULATORY_CARE_PROVIDER_SITE_OTHER): Payer: Medicare HMO | Admitting: Podiatry

## 2021-04-11 ENCOUNTER — Other Ambulatory Visit: Payer: Self-pay

## 2021-04-11 DIAGNOSIS — B351 Tinea unguium: Secondary | ICD-10-CM | POA: Diagnosis not present

## 2021-04-11 DIAGNOSIS — L84 Corns and callosities: Secondary | ICD-10-CM

## 2021-04-11 DIAGNOSIS — I739 Peripheral vascular disease, unspecified: Secondary | ICD-10-CM

## 2021-04-11 DIAGNOSIS — M79675 Pain in left toe(s): Secondary | ICD-10-CM | POA: Diagnosis not present

## 2021-04-11 DIAGNOSIS — M79674 Pain in right toe(s): Secondary | ICD-10-CM

## 2021-04-13 NOTE — Progress Notes (Signed)
?  Subjective:  ?Patient ID: Gerald Martin, male    DOB: 08/22/1965,  MRN: [id number] ? ?Chief Complaint  ?Patient presents with  ? Nail Problem  ? Callouses  ?  Nail and callus trim, numbness, tingling in toes and feet  ? ? ?56 y.o. male presents with the above complaint. History confirmed with patient.  Nails are thickened elongated and like they are coming off he has difficulty cutting them, he has significant calluses on both feet that are very painful as well feels like they developed after stepping on nails many years ago ? ?Objective:  ?Physical Exam: ?warm, good capillary refill, no trophic changes or ulcerative lesions, normal sensory exam, PT reduced bilateral, palpable DP pulse, and significant varicosities, minor edema and venous stasis dermatitis noted.  He has bilateral submetatarsal 2 and 5 deep painful hyperkeratotic lesion as well as the left plantar heel ?Left Foot: dystrophic yellowed discolored nail plates with subungual debris ?Right Foot: dystrophic yellowed discolored nail plates with subungual debris ? ?Assessment:  ? ?1. Onychomycosis   ?2. Peripheral vascular disease ([place])   ?3. Pre-ulcerative calluses   ?4. Pain due to onychomycosis of toenails of both feet   ? ? ? ?Plan:  ?Patient was evaluated and treated and all questions answered. ? ?Discussed the etiology and treatment options for the condition in detail with the patient. Educated patient on the topical and oral treatment options for mycotic nails. Recommended debridement of the nails today. Sharp and mechanical debridement performed of all painful and mycotic nails today. Nails debrided in length and thickness using a nail nipper to level of comfort. Discussed treatment options including appropriate shoe gear. Follow up as needed for painful nails. ? ? ?All symptomatic hyperkeratoses were safely debrided with a sterile #15 blade to patient's level of comfort without incident. We discussed preventative and palliative care of these  lesions including supportive and accommodative shoegear, padding, prefabricated and custom molded accommodative orthoses, use of a pumice stone and lotions/creams daily. ? ? ?Return if symptoms worsen or fail to improve.  ? ?

## 2021-04-23 ENCOUNTER — Other Ambulatory Visit: Payer: Self-pay | Admitting: Physician Assistant

## 2021-04-23 DIAGNOSIS — I6340 Cerebral infarction due to embolism of unspecified cerebral artery: Secondary | ICD-10-CM

## 2021-04-23 DIAGNOSIS — E782 Mixed hyperlipidemia: Secondary | ICD-10-CM
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
## 2021-04-25 ENCOUNTER — Other Ambulatory Visit: Payer: Self-pay

## 2021-04-25 DIAGNOSIS — I6340 Cerebral infarction due to embolism of unspecified cerebral artery: Secondary | ICD-10-CM

## 2021-04-25 DIAGNOSIS — E782 Mixed hyperlipidemia: Secondary | ICD-10-CM

## 2021-04-25 MED ORDER — ATORVASTATIN CALCIUM 40 MG PO TABS: ORAL_TABLET | ORAL | 0 refills | Status: DC

## 2021-05-23 ENCOUNTER — Encounter: Payer: Self-pay | Admitting: Physician Assistant

## 2021-05-23 ENCOUNTER — Ambulatory Visit (INDEPENDENT_AMBULATORY_CARE_PROVIDER_SITE_OTHER): Payer: Medicare HMO | Admitting: Physician Assistant

## 2021-05-23 VITALS — BP 132/90 | HR 74 | Temp 98.3°F | Resp 16 | Ht 76.0 in | Wt 186.0 lb

## 2021-05-23 DIAGNOSIS — J439 Emphysema, unspecified: Secondary | ICD-10-CM

## 2021-05-23 DIAGNOSIS — F17219 Nicotine dependence, cigarettes, with unspecified nicotine-induced disorders: Secondary | ICD-10-CM | POA: Diagnosis not present

## 2021-05-23 DIAGNOSIS — E782 Mixed hyperlipidemia: Secondary | ICD-10-CM

## 2021-05-23 DIAGNOSIS — G8929 Other chronic pain: Secondary | ICD-10-CM

## 2021-05-23 DIAGNOSIS — M545 Low back pain, unspecified: Secondary | ICD-10-CM

## 2021-05-23 DIAGNOSIS — I10 Essential (primary) hypertension: Secondary | ICD-10-CM | POA: Diagnosis not present

## 2021-05-23 MED ORDER — TRAMADOL HCL 50 MG PO TABS: ORAL_TABLET | ORAL | 0 refills | Status: DC

## 2021-05-23 NOTE — Progress Notes (Deleted)
[facility] ?[address] ?[place], [place] [address] ? ?Internal MEDICINE  ?Office Visit Note ? ?Patient Name: Gerald Martin ? [id number]  ?[id number] ? ?Date of Service: 05/23/2021 ? ?Chief Complaint  ?Patient presents with  ? Follow-up  ? Depression  ? Gastroesophageal Reflux  ? Hypertension  ? Hyperlipidemia  ? Neck Pain  ?  Neck has been hurting for 2 days  ? ? ?HPI ?Pt is here for routine follow up ?-still needs to find pain management and requests tramadol today in the meantime ?-Toenail debridement with podiatry recently and states he did not receive any meds for this and is going to follow back up with them regarding treatment options and further nail care ?-still needs to get labs--will reprint orders and states he will give this to his aid ?-Smoking 1.5ppd and will work on cutting back down. Has not had PFT and will have this done now. He did have CT chest W contrast in 2021 from an ED visit showing emphysema but no other lung cancer screening CT and may need this in future ?-breathing ok but some wheezing ?-still has not gone to eye doctor and reports he lost his glasses and also has not gotten a BP cuff ? ?Current Medication: ?Outpatient Encounter Medications as of 05/23/2021  ?Medication Sig  ? amLODipine (NORVASC) 10 MG tablet TAKE 1 TABLET BY MOUTH ONCE DAILY  ? ASPIRIN LOW DOSE 81 MG EC tablet TAKE 1 TABLET BY MOUTH ONCE DAILY  ? atorvastatin (LIPITOR) 40 MG tablet TAKE 1 TABLET BY MOUTH ONCE DAILY AT 6:00PM  ? buPROPion (WELLBUTRIN XL) 150 MG 24 hr tablet TAKE 1 TABLET BY MOUTH ONCE DAILY  ? carvedilol (COREG) 6.25 MG tablet TAKE 1 TABLET BY MOUTH TWICE DAILY WITH A MEAL  ? clopidogrel (PLAVIX) 75 MG tablet TAKE 1 TABLET BY MOUTH ONCE DAILY  ? COMBIVENT RESPIMAT 20-100 MCG/ACT AERS respimat Inhale 1 puff into the lungs 4 (four) times daily.  ? FLUoxetine (PROZAC) 40 MG capsule TAKE 1 CAPSULE BY MOUTH ONCE DAILY  ? INCRUSE ELLIPTA 62.5 MCG/ACT AEPB INHALE 1 PUFF INTO THE LUNGS ONCE  DAILY.*RINSE MOUTH AFTER USE AS DIRECTED  ? Ipratropium-Albuterol (COMBIVENT RESPIMAT) 20-100 MCG/ACT AERS respimat Inhale 1 puff into the lungs 4 (four) times daily.  ? lisinopril (ZESTRIL) 40 MG tablet TAKE 1 TABLET BY MOUTH ONCE DAILY  ? meclizine (ANTIVERT) 25 MG tablet Take 1 tablet (25 mg total) by mouth 3 (three) times daily as needed for dizziness.  ? omeprazole (PRILOSEC) 20 MG capsule TAKE 1 CAPSULE BY MOUTH ONCE DAILY  ? traMADol (ULTRAM) 50 MG tablet TAKE 1 TABLET BY MOUTH 3 TIMES DAILY FORPAIN  ? traZODone (DESYREL) 50 MG tablet TAKE 1 TABLET BY MOUTH AT BEDTIME  ? ?No facility-administered encounter medications on file as of 05/23/2021.  ? ? ?Surgical History: ?Past Surgical History:  ?Procedure Laterality Date  ? arm surgery[Other] Left   ? chain saw injury  ? chronic pain    ? LEG SURGERY Right   ? ? ?Medical History: ?Past Medical History:  ?Diagnosis Date  ? Anxiety   ? Chronic pain   ? back  ? COPD (chronic obstructive pulmonary disease) (HCC)   ? Depression   ? GERD (gastroesophageal reflux disease)   ? Hyperlipidemia   ? Hypertension   ? Stroke [facility])   ? ? ?Family History: ?Family History  ?Problem Relation Age of Onset  ? Stroke Mother   ? Diabetes Father   ? ? ?  Social History  ? ?Socioeconomic History  ? Marital status: Single  ?  Spouse name: Not on file  ? Number of children: Not on file  ? Years of education: Not on file  ? Highest education level: Not on file  ?Occupational History  ? Not on file  ?Tobacco Use  ? Smoking status: Every Day  ?  Packs/day: 2.00  ?  Years: 30.00  ?  Pack years: 60.00  ?  Types: Cigarettes  ? Smokeless tobacco: Never  ? Tobacco comments:  ?  Smokes 2 packs+ a day  ?Vaping Use  ? Vaping Use: Never used  ?Substance and Sexual Activity  ? Alcohol use: Not Currently  ? Drug use: Never  ? Sexual activity: Not on file  ?Other Topics Concern  ? Not on file  ?Social History Narrative  ? ** Merged History Encounter **  ?    ? ?Social Determinants of Health  ? ?Financial  Resource Strain: Not on file  ?Food Insecurity: Not on file  ?Transportation Needs: Not on file  ?Physical Activity: Not on file  ?Stress: Not on file  ?Social Connections: Not on file  ?Intimate Partner Violence: Not on file  ? ? ? ? ?Review of Systems  ?Constitutional:  Positive for fatigue. Negative for chills and unexpected weight change.  ?HENT:  Negative for congestion, postnasal drip, rhinorrhea, sneezing and sore throat.   ?Eyes:  Positive for pain. Negative for redness.  ?Respiratory:  Negative for cough, chest tightness and shortness of breath.   ?Cardiovascular:  Negative for chest pain and palpitations.  ?Gastrointestinal:  Negative for abdominal pain, constipation, diarrhea, nausea and vomiting.  ?Genitourinary:  Negative for dysuria and frequency.  ?Musculoskeletal:  Positive for arthralgias, back pain and gait problem. Negative for joint swelling and neck pain.  ?Skin:  Negative for rash.  ?Neurological:  Negative for tremors and numbness.  ?Hematological:  Negative for adenopathy. Does not bruise/bleed easily.  ?Psychiatric/Behavioral:  Negative for sleep disturbance and suicidal ideas. Behavioral problem: Depression.The patient is not nervous/anxious.   ? ?Vital Signs: ?BP (!) 113/98   Pulse 74   Temp 98.3 ?F (36.8 ?C)   Resp 16   Ht 6\' 4"  (1.93 m)   Wt 186 lb (84.4 kg)   SpO2 98%   BMI 22.64 kg/m?  ? ? ?Physical Exam ?Vitals and nursing note reviewed.  ?Constitutional:   ?   General: He is not in acute distress. ?   Appearance: He is well-developed and normal weight. He is not diaphoretic.  ?HENT:  ?   Head: Normocephalic and atraumatic.  ?   Mouth/Throat:  ?   Pharynx: No oropharyngeal exudate.  ?Eyes:  ?   Pupils: Pupils are equal, round, and reactive to light.  ?Neck:  ?   Thyroid: No thyromegaly.  ?   Vascular: No JVD.  ?   Trachea: No tracheal deviation.  ?Cardiovascular:  ?   Rate and Rhythm: Normal rate and regular rhythm.  ?   Heart sounds: Normal heart sounds. No murmur heard. ?  No  friction rub. No gallop.  ?Pulmonary:  ?   Effort: Pulmonary effort is normal. No respiratory distress.  ?   Breath sounds: No wheezing or rales.  ?Chest:  ?   Chest wall: No tenderness.  ?Abdominal:  ?   General: Bowel sounds are normal.  ?   Palpations: Abdomen is soft.  ?Musculoskeletal:     ?   General: Normal range of motion.  ?  Cervical back: Normal range of motion and neck supple.  ?Lymphadenopathy:  ?   Cervical: No cervical adenopathy.  ?Skin: ?   General: Skin is warm and dry.  ?Neurological:  ?   Mental Status: He is alert and oriented to person, place, and time.  ?   Cranial Nerves: No cranial nerve deficit.  ?   Gait: Gait abnormal.  ?   Comments: Walks with cane  ?Psychiatric:     ?   Behavior: Behavior normal.     ?   Thought Content: Thought content normal.     ?   Judgment: Judgment normal.  ? ? ? ? ? ?Assessment/Plan: ?1. Essential hypertension ?Stable, continue current medications ? ?2. Pulmonary emphysema, unspecified emphysema type (HCC) ?Will order PFT for further evaluation and should continue inhalers as prescribed. ?- Pulmonary Function Test; Future ? ?3. Cigarette nicotine dependence with nicotine-induced disorder ?Discussed working on smoking cessation and at least cutting back down on usage. ?

## 2021-05-24 NOTE — Progress Notes (Signed)
[place] ?[address] ?[place], [place] [address] ? ?Internal MEDICINE  ?Office Visit Note ? ?Patient Name: Gerald Martin ? [id number]  ?[id number] ? ?Date of Service: 05/24/2021 ? ?Chief Complaint  ?Patient presents with  ? Follow-up  ? Depression  ? Gastroesophageal Reflux  ? Hypertension  ? Hyperlipidemia  ? Neck Pain  ?  Neck has been hurting for 2 days  ? ? ?HPI ?Pt is here for routine follow up ?-still needs to find pain management and requests tramadol today in the meantime ?-Toenail debridement with podiatry recently and states he did not receive any meds for this and is going to follow back up with them regarding treatment options and further nail care ?-still needs to get labs--will reprint orders and states he will give this to his aid ?-Smoking 1.5ppd and will work on cutting back down. Has not had PFT and will have this done now. He did have CT chest W contrast in 2021 from an ED visit showing emphysema but no other lung cancer screening CT and may need this in future ?-breathing ok but some wheezing ?-still has not gone to eye doctor and reports he lost his glasses and also has not gotten a BP cuff ? ?Current Medication: ?Outpatient Encounter Medications as of 05/23/2021  ?Medication Sig  ? amLODipine (NORVASC) 10 MG tablet TAKE 1 TABLET BY MOUTH ONCE DAILY  ? ASPIRIN LOW DOSE 81 MG EC tablet TAKE 1 TABLET BY MOUTH ONCE DAILY  ? atorvastatin (LIPITOR) 40 MG tablet TAKE 1 TABLET BY MOUTH ONCE DAILY AT 6:00PM  ? buPROPion (WELLBUTRIN XL) 150 MG 24 hr tablet TAKE 1 TABLET BY MOUTH ONCE DAILY  ? carvedilol (COREG) 6.25 MG tablet TAKE 1 TABLET BY MOUTH TWICE DAILY WITH A MEAL  ? clopidogrel (PLAVIX) 75 MG tablet TAKE 1 TABLET BY MOUTH ONCE DAILY  ? COMBIVENT RESPIMAT 20-100 MCG/ACT AERS respimat Inhale 1 puff into the lungs 4 (four) times daily.  ? FLUoxetine (PROZAC) 40 MG capsule TAKE 1 CAPSULE BY MOUTH ONCE DAILY  ? INCRUSE ELLIPTA 62.5 MCG/ACT AEPB INHALE 1 PUFF INTO THE LUNGS ONCE  DAILY.*RINSE MOUTH AFTER USE AS DIRECTED  ? Ipratropium-Albuterol (COMBIVENT RESPIMAT) 20-100 MCG/ACT AERS respimat Inhale 1 puff into the lungs 4 (four) times daily.  ? lisinopril (ZESTRIL) 40 MG tablet TAKE 1 TABLET BY MOUTH ONCE DAILY  ? meclizine (ANTIVERT) 25 MG tablet Take 1 tablet (25 mg total) by mouth 3 (three) times daily as needed for dizziness.  ? omeprazole (PRILOSEC) 20 MG capsule TAKE 1 CAPSULE BY MOUTH ONCE DAILY  ? traZODone (DESYREL) 50 MG tablet TAKE 1 TABLET BY MOUTH AT BEDTIME  ? [DISCONTINUED] traMADol (ULTRAM) 50 MG tablet TAKE 1 TABLET BY MOUTH 3 TIMES DAILY FORPAIN  ? traMADol (ULTRAM) 50 MG tablet May take 1 tablet by mouth up to 3 times per day as needed for pain  ? ?No facility-administered encounter medications on file as of 05/23/2021.  ? ? ?Surgical History: ?Past Surgical History:  ?Procedure Laterality Date  ? arm surgery[Other] Left   ? chain saw injury  ? chronic pain    ? LEG SURGERY Right   ? ? ?Medical History: ?Past Medical History:  ?Diagnosis Date  ? Anxiety   ? Chronic pain   ? back  ? COPD (chronic obstructive pulmonary disease) ([place])   ? Depression   ? GERD (gastroesophageal reflux disease)   ? Hyperlipidemia   ? Hypertension   ? Stroke [facility])   ? ? ?  Family History: ?Family History  ?Problem Relation Age of Onset  ? Stroke Mother   ? Diabetes Father   ? ? ?Social History  ? ?Socioeconomic History  ? Marital status: Single  ?  Spouse name: Not on file  ? Number of children: Not on file  ? Years of education: Not on file  ? Highest education level: Not on file  ?Occupational History  ? Not on file  ?Tobacco Use  ? Smoking status: Every Day  ?  Packs/day: 2.00  ?  Years: 30.00  ?  Pack years: 60.00  ?  Types: Cigarettes  ? Smokeless tobacco: Never  ? Tobacco comments:  ?  Smokes 2 packs+ a day  ?Vaping Use  ? Vaping Use: Never used  ?Substance and Sexual Activity  ? Alcohol use: Not Currently  ? Drug use: Never  ? Sexual activity: Not on file  ?Other Topics Concern  ? Not on  file  ?Social History Narrative  ? ** Merged History Encounter **  ?    ? ?Social Determinants of Health  ? ?Financial Resource Strain: Not on file  ?Food Insecurity: Not on file  ?Transportation Needs: Not on file  ?Physical Activity: Not on file  ?Stress: Not on file  ?Social Connections: Not on file  ?Intimate Partner Violence: Not on file  ? ? ? ? ?Review of Systems  ?Constitutional:  Positive for fatigue. Negative for chills and unexpected weight change.  ?HENT:  Negative for congestion, postnasal drip, rhinorrhea, sneezing and sore throat.   ?Eyes:  Positive for pain. Negative for redness.  ?Respiratory:  Negative for cough, chest tightness and shortness of breath.   ?Cardiovascular:  Negative for chest pain and palpitations.  ?Gastrointestinal:  Negative for abdominal pain, constipation, diarrhea, nausea and vomiting.  ?Genitourinary:  Negative for dysuria and frequency.  ?Musculoskeletal:  Positive for arthralgias, back pain and gait problem. Negative for joint swelling and neck pain.  ?Skin:  Negative for rash.  ?Neurological:  Negative for tremors and numbness.  ?Hematological:  Negative for adenopathy. Does not bruise/bleed easily.  ?Psychiatric/Behavioral:  Negative for sleep disturbance and suicidal ideas. Behavioral problem: Depression.The patient is not nervous/anxious.   ? ?Vital Signs: ?BP 132/90 Comment: 113/98  Pulse 74   Temp 98.3 ?F (36.8 ?C)   Resp 16   Ht 6\' 4"  (1.93 m)   Wt 186 lb (84.4 kg)   SpO2 98%   BMI 22.64 kg/m?  ? ? ?Physical Exam ?Vitals and nursing note reviewed.  ?Constitutional:   ?   General: He is not in acute distress. ?   Appearance: He is well-developed and normal weight. He is not diaphoretic.  ?HENT:  ?   Head: Normocephalic and atraumatic.  ?   Mouth/Throat:  ?   Pharynx: No oropharyngeal exudate.  ?Eyes:  ?   Pupils: Pupils are equal, round, and reactive to light.  ?Neck:  ?   Thyroid: No thyromegaly.  ?   Vascular: No JVD.  ?   Trachea: No tracheal deviation.   ?Cardiovascular:  ?   Rate and Rhythm: Normal rate and regular rhythm.  ?   Heart sounds: Normal heart sounds. No murmur heard. ?  No friction rub. No gallop.  ?Pulmonary:  ?   Effort: Pulmonary effort is normal. No respiratory distress.  ?   Breath sounds: No wheezing or rales.  ?Chest:  ?   Chest wall: No tenderness.  ?Abdominal:  ?   General: Bowel sounds are normal.  ?  Palpations: Abdomen is soft.  ?Musculoskeletal:     ?   General: Normal range of motion.  ?   Cervical back: Normal range of motion and neck supple.  ?Lymphadenopathy:  ?   Cervical: No cervical adenopathy.  ?Skin: ?   General: Skin is warm and dry.  ?Neurological:  ?   Mental Status: He is alert and oriented to person, place, and time.  ?   Cranial Nerves: No cranial nerve deficit.  ?   Gait: Gait abnormal.  ?   Comments: Walks with cane  ?Psychiatric:     ?   Behavior: Behavior normal.     ?   Thought Content: Thought content normal.     ?   Judgment: Judgment normal.  ? ? ? ? ? ?Assessment/Plan: ?1. Essential hypertension ?Stable, continue current medications ? ?2. Pulmonary emphysema, unspecified emphysema type ([place]) ?Will order PFT for further evaluation and should continue inhalers as prescribed. ?- Pulmonary Function Test; Future ? ?3. Cigarette nicotine dependence with nicotine-induced disorder ?Discussed working on smoking cessation and at least cutting back down on usage.  May need to have updated CT chest for lung cancer screening in future ? ?4. Mixed hyperlipidemia ?Continue Lipitor and will have labs updated ? ?5. Chronic midline low back pain without sciatica ?May continue tramadol as needed however patient is also looking into establishing with pain management as he finds tramadol does not completely control his pain ?- traMADol (ULTRAM) 50 MG tablet; May take 1 tablet by mouth up to 3 times per day as needed for pain  Dispense: 90 tablet; Refill: 0 ? ? ?General Counseling: [name] understanding of the findings of todays  visit and agrees with plan of treatment. I have discussed any further diagnostic evaluation that may be needed or ordered today. We also reviewed his medications today. he has been encouraged to call the office with an

## 2021-05-25 ENCOUNTER — Ambulatory Visit (INDEPENDENT_AMBULATORY_CARE_PROVIDER_SITE_OTHER): Payer: Medicare HMO | Admitting: Internal Medicine

## 2021-05-25 DIAGNOSIS — J439 Emphysema, unspecified: Secondary | ICD-10-CM | POA: Diagnosis not present

## 2021-05-30 ENCOUNTER — Ambulatory Visit: Payer: Medicare HMO | Admitting: Physician Assistant

## 2021-06-15 NOTE — Procedures (Signed)
[facility] [address], [address]    Complete Pulmonary Function Testing Interpretation:  FINDINGS:  Forced vital capacity is mildly decreased.  FEV1 is 1.64 L which is 36% of predicted and is severely decreased.  FEV1 FVC ratio is severely decreased.  Postbronchodilator there was no significant change in the FEV1 however clinical improvement may occur total lung capacity is increased residual volume is increased residual volume total lung capacity ratio was increased.  FRC is increased.  Patient not able to perform DLCO  IMPRESSION:  This pulmonary function study is suggestive of severe obstructive lung disease clinical correlation is recommended.  Patient was not able to perform the DLCO maneuver  Allyne Gee, MD [facility] Pulmonary Critical Care Medicine Sleep Medicine

## 2021-06-16 ENCOUNTER — Encounter: Payer: Self-pay | Admitting: Internal Medicine

## 2021-06-16 LAB — PULMONARY FUNCTION TEST

## 2021-06-24 ENCOUNTER — Other Ambulatory Visit: Payer: Self-pay | Admitting: Physician Assistant

## 2021-07-28 ENCOUNTER — Other Ambulatory Visit: Payer: Self-pay | Admitting: Physician Assistant

## 2021-07-28 DIAGNOSIS — F332 Major depressive disorder, recurrent severe without psychotic features: Secondary | ICD-10-CM

## 2021-07-28 DIAGNOSIS — I161 Hypertensive emergency: Secondary | ICD-10-CM

## 2021-07-28 DIAGNOSIS — F17219 Nicotine dependence, cigarettes, with unspecified nicotine-induced disorders: Secondary | ICD-10-CM

## 2021-07-28 DIAGNOSIS — I6340 Cerebral infarction due to embolism of unspecified cerebral artery: Secondary | ICD-10-CM

## 2021-08-29 ENCOUNTER — Telehealth: Payer: Self-pay

## 2021-08-29 ENCOUNTER — Ambulatory Visit: Payer: Medicare HMO | Admitting: Physician Assistant

## 2021-08-29 NOTE — Telephone Encounter (Signed)
Patient cancelled today's appointment due to no transportation. Attempted to call patient to r/s. No answer, no vm-Toni

## 2021-10-20 ENCOUNTER — Telehealth: Payer: Self-pay | Admitting: Physician Assistant

## 2021-10-20 NOTE — Telephone Encounter (Signed)
Received Case Communication from [facility]. Gave to Lauren to be signed-Toni

## 2021-10-21 ENCOUNTER — Telehealth: Payer: Self-pay | Admitting: Physician Assistant

## 2021-10-21 NOTE — Telephone Encounter (Signed)
Communication report signed. Faxed back to [facility] [phone]

## 2021-11-01 ENCOUNTER — Other Ambulatory Visit: Payer: Self-pay | Admitting: Physician Assistant

## 2021-11-01 DIAGNOSIS — I10 Essential (primary) hypertension: Secondary | ICD-10-CM

## 2021-11-01 DIAGNOSIS — F17219 Nicotine dependence, cigarettes, with unspecified nicotine-induced disorders: Secondary | ICD-10-CM

## 2021-11-08 ENCOUNTER — Other Ambulatory Visit: Payer: Self-pay | Admitting: Physician Assistant

## 2021-11-08 DIAGNOSIS — M545 Low back pain, unspecified: Secondary | ICD-10-CM

## 2021-11-28 ENCOUNTER — Encounter: Payer: Self-pay | Admitting: Physician Assistant

## 2021-11-28 ENCOUNTER — Ambulatory Visit (INDEPENDENT_AMBULATORY_CARE_PROVIDER_SITE_OTHER): Payer: Medicare HMO | Admitting: Physician Assistant

## 2021-11-28 VITALS — BP 138/86 | HR 73 | Temp 98.5°F | Resp 16 | Ht 76.0 in | Wt 188.0 lb

## 2021-11-28 DIAGNOSIS — S0990XA Unspecified injury of head, initial encounter: Secondary | ICD-10-CM

## 2021-11-28 DIAGNOSIS — Z1212 Encounter for screening for malignant neoplasm of rectum: Secondary | ICD-10-CM

## 2021-11-28 DIAGNOSIS — E782 Mixed hyperlipidemia: Secondary | ICD-10-CM | POA: Diagnosis not present

## 2021-11-28 DIAGNOSIS — F17219 Nicotine dependence, cigarettes, with unspecified nicotine-induced disorders: Secondary | ICD-10-CM | POA: Diagnosis not present

## 2021-11-28 DIAGNOSIS — Z0001 Encounter for general adult medical examination with abnormal findings: Secondary | ICD-10-CM | POA: Diagnosis not present

## 2021-11-28 DIAGNOSIS — R300 Dysuria: Secondary | ICD-10-CM

## 2021-11-28 DIAGNOSIS — J439 Emphysema, unspecified: Secondary | ICD-10-CM | POA: Diagnosis not present

## 2021-11-28 DIAGNOSIS — Z1211 Encounter for screening for malignant neoplasm of colon: Secondary | ICD-10-CM

## 2021-11-28 DIAGNOSIS — R5383 Other fatigue: Secondary | ICD-10-CM

## 2021-11-28 NOTE — Progress Notes (Signed)
[facility] [address] [place], [place] [address]  Internal MEDICINE  Office Visit Note  Patient Name: Gerald Martin  [id number]  [id number]  Date of Service: 11/28/2021  Chief Complaint  Patient presents with   Medicare Wellness   Depression   Gastroesophageal Reflux   Hypertension   Hyperlipidemia   Fall    Patient fell a few weeks ago, hit the left side of his face/head - still having facial and head pain, did cut back of his head   Quality Metric Gaps    Colonoscopy, Lung Screening     HPI Pt is here for routine health maintenance examination -Did have a fall a few weeks ago. He was sitting and went to lay back and hit his head on the corner of a table. The back of his head started bleeding at the time. He did not seek any care. He continues to have headaches and left eye is bothering him. Some glaze over right eye as well. He does still need to go get glasses and start wearing them. Will need to order CT head for further evaluation due to headaches post injury even though injury was several week ago, especially since pt is on ASA and plavix  -back also bothers him, especially near right scapula and will try some exercises and stretching -PFT reviewed and showed severe COPD. Continues to smoke. He smoked about 3packs last Friday. -He has been smoking for greater than 20 years, at least 1ppd, usually more. Due for CT chest lung cancer screening as well and will order this. Again discussed cutting back on smoking/quitting. Has incruse inhaler at home that he sometimes uses and was encouraged to use daily. -Still needs to schedule colonoscopy, had seen Dr Servando Snare for hepatitis and elevated LFTS last year but he never followed up and needs to do so. Will send referral back to him to address these concerns in addition to colonoscopy. -Overdue for routine labs--never had these done last year and will reorder  Current Medication: Outpatient Encounter Medications as of  11/28/2021  Medication Sig   amLODipine (NORVASC) 10 MG tablet TAKE 1 TABLET BY MOUTH ONCE DAILY   ASPIRIN LOW DOSE 81 MG EC tablet TAKE 1 TABLET BY MOUTH ONCE DAILY   atorvastatin (LIPITOR) 40 MG tablet TAKE 1 TABLET BY MOUTH ONCE DAILY AT 6:00PM   buPROPion (WELLBUTRIN XL) 150 MG 24 hr tablet TAKE 1 TABLET BY MOUTH ONCE DAILY   carvedilol (COREG) 6.25 MG tablet TAKE 1 TABLET BY MOUTH TWICE DAILY WITH A MEAL   clopidogrel (PLAVIX) 75 MG tablet TAKE 1 TABLET BY MOUTH ONCE DAILY   COMBIVENT RESPIMAT 20-100 MCG/ACT AERS respimat Inhale 1 puff into the lungs 4 (four) times daily.   FLUoxetine (PROZAC) 40 MG capsule TAKE 1 CAPSULE BY MOUTH ONCE DAILY   INCRUSE ELLIPTA 62.5 MCG/ACT AEPB INHALE 1 PUFF INTO THE LUNGS ONCE DAILY.*RINSE MOUTH AFTER USE AS DIRECTED   Ipratropium-Albuterol (COMBIVENT RESPIMAT) 20-100 MCG/ACT AERS respimat Inhale 1 puff into the lungs 4 (four) times daily.   lisinopril (ZESTRIL) 40 MG tablet TAKE 1 TABLET BY MOUTH ONCE DAILY   meclizine (ANTIVERT) 25 MG tablet Take 1 tablet (25 mg total) by mouth 3 (three) times daily as needed for dizziness.   omeprazole (PRILOSEC) 20 MG capsule TAKE 1 CAPSULE BY MOUTH ONCE DAILY   traMADol (ULTRAM) 50 MG tablet TAKE 1 TABLET BY MOUTH 3 TIMES DAILY AS NEEDED FOR PAIN   traZODone (DESYREL) 50 MG tablet TAKE  1 TABLET BY MOUTH AT BEDTIME   No facility-administered encounter medications on file as of 11/28/2021.    Surgical History: Past Surgical History:  Procedure Laterality Date   arm surgery[Other] Left    chain saw injury   chronic pain     LEG SURGERY Right     Medical History: Past Medical History:  Diagnosis Date   Anxiety    Chronic pain    back   COPD (chronic obstructive pulmonary disease) (HCC)    Depression    GERD (gastroesophageal reflux disease)    Hyperlipidemia    Hypertension    Stroke [facility])     Family History: Family History  Problem Relation Age of Onset   Stroke Mother    Diabetes Father        Review of Systems  Constitutional:  Positive for fatigue. Negative for chills and unexpected weight change.  HENT:  Negative for congestion, postnasal drip, rhinorrhea, sneezing and sore throat.   Eyes:  Positive for pain. Negative for redness.  Respiratory:  Negative for cough, chest tightness and shortness of breath.   Cardiovascular:  Negative for chest pain and palpitations.  Gastrointestinal:  Negative for abdominal pain, constipation, diarrhea, nausea and vomiting.  Genitourinary:  Negative for dysuria and frequency.  Musculoskeletal:  Positive for arthralgias, back pain and gait problem. Negative for joint swelling and neck pain.  Skin:  Negative for rash.  Neurological:  Positive for headaches. Negative for tremors and numbness.  Hematological:  Negative for adenopathy. Does not bruise/bleed easily.  Psychiatric/Behavioral:  Negative for sleep disturbance and suicidal ideas. Behavioral problem: Depression.The patient is not nervous/anxious.      Vital Signs: BP 138/86   Pulse 73   Temp 98.5 F (36.9 C)   Resp 16   Ht 6\' 4"  (1.93 m)   Wt 188 lb (85.3 kg)   SpO2 97%   BMI 22.88 kg/m    Physical Exam Vitals and nursing note reviewed.  Constitutional:      General: He is not in acute distress.    Appearance: He is well-developed and normal weight. He is not diaphoretic.  HENT:     Head: Normocephalic and atraumatic.     Mouth/Throat:     Pharynx: No oropharyngeal exudate.  Eyes:     Extraocular Movements: Extraocular movements intact.     Pupils: Pupils are equal, round, and reactive to light.  Neck:     Thyroid: No thyromegaly.     Vascular: No JVD.     Trachea: No tracheal deviation.  Cardiovascular:     Rate and Rhythm: Normal rate and regular rhythm.     Heart sounds: Normal heart sounds. No murmur heard.    No friction rub. No gallop.  Pulmonary:     Effort: Pulmonary effort is normal. No respiratory distress.     Breath sounds: No wheezing or  rales.  Chest:     Chest wall: No tenderness.  Abdominal:     General: Bowel sounds are normal.     Palpations: Abdomen is soft.  Musculoskeletal:        General: Tenderness present. Normal range of motion.     Cervical back: Normal range of motion and neck supple.  Lymphadenopathy:     Cervical: No cervical adenopathy.  Skin:    General: Skin is warm and dry.  Neurological:     General: No focal deficit present.     Mental Status: He is alert and oriented to person, place, and  time.     Cranial Nerves: No cranial nerve deficit.     Gait: Gait abnormal.     Comments: Walks with cane  Psychiatric:        Behavior: Behavior normal.        Thought Content: Thought content normal.        Judgment: Judgment normal.      LABS: No results found for this or any previous visit (from the past 2160 hour(s)).      Assessment/Plan: 1. Encounter for general adult medical examination with abnormal findings CPE performed, due for colonoscopy, due for routine labs - CBC w/Diff/Platelet - Comprehensive metabolic panel - TSH + free T4 - Lipid Panel With LDL/HDL Ratio  2. Traumatic injury of head, initial encounter Will order CT head due to continued headaches since injury a few weeks ago. Discussed going to ED if any new or worsening symptoms - CT HEAD WO CONTRAST (5MM); Future  3. Pulmonary emphysema, unspecified emphysema type ([place]) Continue inhalers and will order CT chest lung cancer screening - CT CHEST LUNG CA SCREEN LOW DOSE W/O CM; Future  4. Screening for colorectal cancer - Ambulatory referral to Gastroenterology  5. Cigarette nicotine dependence with nicotine-induced disorder - CT CHEST LUNG CA SCREEN LOW DOSE W/O CM; Future  6. Mixed hyperlipidemia - Lipid Panel With LDL/HDL Ratio  7. Other fatigue - CBC w/Diff/Platelet - Comprehensive metabolic panel - TSH + free T4 - Lipid Panel With LDL/HDL Ratio  8. Dysuria - UA/M w/rflx Culture, Routine   General  Counseling: Lyonel verbalizes understanding of the findings of todays visit and agrees with plan of treatment. I have discussed any further diagnostic evaluation that may be needed or ordered today. We also reviewed his medications today. he has been encouraged to call the office with any questions or concerns that should arise related to todays visit.    Counseling:    Orders Placed This Encounter  Procedures   CT HEAD WO CONTRAST (5MM)   CT CHEST LUNG CA SCREEN LOW DOSE W/O CM   UA/M w/rflx Culture, Routine   CBC w/Diff/Platelet   Comprehensive metabolic panel   TSH + free T4   Lipid Panel With LDL/HDL Ratio   Ambulatory referral to Gastroenterology    No orders of the defined types were placed in this encounter.   This patient was seen by Drema Dallas, PA-C in collaboration with Dr. Clayborn Bigness as a part of collaborative care agreement.  Total time spent:35 Minutes  Time spent includes review of chart, medications, test results, and follow up plan with the patient.     Lavera Guise, MD  Internal Medicine

## 2021-11-29 LAB — UA/M W/RFLX CULTURE, ROUTINE
Bilirubin, UA: NEGATIVE
Glucose, UA: NEGATIVE
Ketones, UA: NEGATIVE
Leukocytes,UA: NEGATIVE
Nitrite, UA: NEGATIVE
Protein,UA: NEGATIVE
RBC, UA: NEGATIVE
Specific Gravity, UA: <=1.005 — AB (ref 1.005–1.030)
Urobilinogen, Ur: 0.2 mg/dL (ref 0.2–1.0)
pH, UA: 6 (ref 5.0–7.5)

## 2021-11-29 LAB — MICROSCOPIC EXAMINATION
Bacteria, UA: NONE SEEN
Casts: NONE SEEN /lpf
Epithelial Cells (non renal): NONE SEEN /hpf (ref 0–10)
RBC, Urine: NONE SEEN /hpf (ref 0–2)
WBC, UA: NONE SEEN /hpf (ref 0–5)

## 2021-11-30 ENCOUNTER — Telehealth: Payer: Self-pay

## 2021-11-30 ENCOUNTER — Ambulatory Visit
Admission: RE | Admit: 2021-11-30 | Discharge: 2021-11-30 | Disposition: A | Discharge status: Home / Self Care | Payer: Medicare HMO | Source: Ambulatory Visit | Attending: Physician Assistant | Admitting: Physician Assistant

## 2021-11-30 DIAGNOSIS — S0990XA Unspecified injury of head, initial encounter: Secondary | ICD-10-CM | POA: Insufficient documentation

## 2021-11-30 NOTE — Telephone Encounter (Signed)
-----   Message from Carlean Jews, PA-C sent at 11/30/2021  2:59 PM EST ----- Please let him know that his CT head was negative for any acute abnormalities

## 2021-11-30 NOTE — Telephone Encounter (Signed)
Pt advised ct head is negative for any acute abnormalities

## 2021-11-30 NOTE — Telephone Encounter (Signed)
Pt advised for CT head is negative for any abnormalities

## 2021-12-01 ENCOUNTER — Other Ambulatory Visit: Payer: Self-pay | Admitting: Internal Medicine

## 2021-12-01 ENCOUNTER — Other Ambulatory Visit: Payer: Self-pay | Admitting: Physician Assistant

## 2021-12-01 DIAGNOSIS — F17219 Nicotine dependence, cigarettes, with unspecified nicotine-induced disorders: Secondary | ICD-10-CM

## 2021-12-19 ENCOUNTER — Ambulatory Visit: Payer: Medicare HMO | Admitting: Physician Assistant

## 2022-01-18 ENCOUNTER — Other Ambulatory Visit: Payer: Self-pay | Admitting: Internal Medicine

## 2022-01-18 DIAGNOSIS — M545 Low back pain, unspecified: Secondary | ICD-10-CM

## 2022-01-18 NOTE — Telephone Encounter (Signed)
Last appt 11/23 and next 02/17/21

## 2022-01-19 NOTE — Telephone Encounter (Signed)
Last 11/23 and 02/17/21

## 2022-02-11 ENCOUNTER — Other Ambulatory Visit: Payer: Self-pay | Admitting: Physician Assistant

## 2022-02-11 DIAGNOSIS — E782 Mixed hyperlipidemia: Secondary | ICD-10-CM

## 2022-02-11 DIAGNOSIS — I6340 Cerebral infarction due to embolism of unspecified cerebral artery: Secondary | ICD-10-CM

## 2022-02-11 DIAGNOSIS — F332 Major depressive disorder, recurrent severe without psychotic features: Secondary | ICD-10-CM

## 2022-02-14 ENCOUNTER — Telehealth: Payer: Self-pay | Admitting: Physician Assistant

## 2022-02-14 ENCOUNTER — Other Ambulatory Visit: Payer: Self-pay | Admitting: Physician Assistant

## 2022-02-14 DIAGNOSIS — F17219 Nicotine dependence, cigarettes, with unspecified nicotine-induced disorders: Secondary | ICD-10-CM

## 2022-02-14 NOTE — Telephone Encounter (Signed)
Notified patient of CT appointment, date, time, location, address & telephone #-Toni

## 2022-02-15 ENCOUNTER — Telehealth: Payer: Self-pay | Admitting: Physician Assistant

## 2022-02-15 NOTE — Telephone Encounter (Signed)
Received Case Communication Report 02/15/22. Gave to Rhine for signature

## 2022-02-16 ENCOUNTER — Telehealth: Payer: Self-pay | Admitting: Physician Assistant

## 2022-02-16 NOTE — Telephone Encounter (Signed)
Case communication report signed. Faxed back; [phone]. To be scanned-nm 

## 2022-02-17 ENCOUNTER — Ambulatory Visit: Payer: Medicare HMO | Admitting: Physician Assistant

## 2022-02-20 ENCOUNTER — Other Ambulatory Visit: Payer: Medicare HMO

## 2022-02-24 ENCOUNTER — Inpatient Hospital Stay: Admission: RE | Admit: 2022-02-24 | Payer: Medicare HMO | Source: Ambulatory Visit

## 2022-02-27 ENCOUNTER — Telehealth: Payer: Self-pay | Admitting: Physician Assistant

## 2022-02-27 ENCOUNTER — Ambulatory Visit: Payer: Medicare HMO | Admitting: Physician Assistant

## 2022-02-27 NOTE — Telephone Encounter (Signed)
Called and lvm to pt to move appt to 05/26/22-nm

## 2022-03-01 ENCOUNTER — Ambulatory Visit: Payer: Medicare HMO | Admitting: Internal Medicine

## 2022-03-10 ENCOUNTER — Other Ambulatory Visit: Payer: Self-pay | Admitting: Physician Assistant

## 2022-03-10 DIAGNOSIS — F17219 Nicotine dependence, cigarettes, with unspecified nicotine-induced disorders: Secondary | ICD-10-CM

## 2022-03-10 DIAGNOSIS — I161 Hypertensive emergency: Secondary | ICD-10-CM

## 2022-03-13 ENCOUNTER — Other Ambulatory Visit: Payer: Self-pay

## 2022-04-07 ENCOUNTER — Other Ambulatory Visit: Payer: Self-pay | Admitting: Internal Medicine

## 2022-04-07 DIAGNOSIS — G8929 Other chronic pain: Secondary | ICD-10-CM

## 2022-04-10 ENCOUNTER — Telehealth: Payer: Self-pay | Admitting: Physician Assistant

## 2022-04-10 ENCOUNTER — Telehealth: Payer: Self-pay

## 2022-04-10 NOTE — Telephone Encounter (Signed)
Patient called asking for a refill for traMADol, gave to [facility] for appointment as he has missed a bunch.

## 2022-04-11 NOTE — Telephone Encounter (Signed)
As per dfk we can do med refills on his appt

## 2022-04-17 ENCOUNTER — Encounter: Payer: Self-pay | Admitting: Physician Assistant

## 2022-04-17 ENCOUNTER — Ambulatory Visit (INDEPENDENT_AMBULATORY_CARE_PROVIDER_SITE_OTHER): Payer: Medicare HMO | Admitting: Physician Assistant

## 2022-04-17 VITALS — BP 150/95 | HR 70 | Temp 98.7°F | Resp 16 | Ht 76.0 in | Wt 177.8 lb

## 2022-04-17 DIAGNOSIS — F17219 Nicotine dependence, cigarettes, with unspecified nicotine-induced disorders: Secondary | ICD-10-CM

## 2022-04-17 DIAGNOSIS — G8929 Other chronic pain: Secondary | ICD-10-CM | POA: Diagnosis not present

## 2022-04-17 DIAGNOSIS — M545 Low back pain, unspecified: Secondary | ICD-10-CM

## 2022-04-17 DIAGNOSIS — I10 Essential (primary) hypertension: Secondary | ICD-10-CM | POA: Diagnosis not present

## 2022-04-17 MED ORDER — TRAMADOL HCL 50 MG PO TABS: ORAL_TABLET | ORAL | 0 refills | Status: DC

## 2022-04-17 NOTE — Progress Notes (Signed)
[facility] [place], [place] [address]  Internal MEDICINE  Office Visit Note  Patient Name: Gerald Martin  [id number]  [id number]  Date of Service: 04/17/2022  Chief Complaint  Patient presents with   Depression   Gastroesophageal Reflux   Hyperlipidemia   Hypertension   Follow-up    HPI Pt is here for routine follow up, he missed his last appt -Has not had lung cancer screening CT or colonoscopy/GI follow up previously ordered and will need to schedule these -Reports he had a ham biscuit, gravy, and coffee before visit along with smoking which likely are raising BP -Bp did not improve on recheck and was encouraged to monitor at home and to adjust diet to avoid sodium -Plans to try nicorette to help quit smoking and was encouraged to work on this -Chronic Glaze/blurring of vision on periphery on right eye, worried about cataract. Advised to follow up with eye doctor -Has not gotten blood work still -reports continued back pain and is looking into finding new pain clinic as tramadol doesn't always help as well. Does need refill of this, however on PMP review pt filled script 03/25/22 and is not yet due for a refill and he will look for bottle when he gets home  Current Medication: Outpatient Encounter Medications as of 04/17/2022  Medication Sig   amLODipine (NORVASC) 10 MG tablet TAKE 1 TABLET BY MOUTH ONCE DAILY   ASPIRIN LOW DOSE 81 MG EC tablet TAKE 1 TABLET BY MOUTH ONCE DAILY   atorvastatin (LIPITOR) 40 MG tablet TAKE 1 TABLET BY MOUTH ONCE DAILY AT 6:00PM   buPROPion (WELLBUTRIN XL) 150 MG 24 hr tablet TAKE 1 TABLET BY MOUTH ONCE DAILY   carvedilol (COREG) 6.25 MG tablet TAKE 1 TABLET BY MOUTH TWICE DAILY WITH A MEAL   clopidogrel (PLAVIX) 75 MG tablet TAKE 1 TABLET BY MOUTH ONCE DAILY   COMBIVENT RESPIMAT 20-100 MCG/ACT AERS respimat Inhale 1 puff into the lungs 4 (four) times daily.   FLUoxetine (PROZAC) 40 MG capsule TAKE 1 CAPSULE BY MOUTH  ONCE DAILY   INCRUSE ELLIPTA 62.5 MCG/ACT AEPB INHALE 1 PUFF INTO THE LUNGS ONCE DAILY.*RINSE MOUTH AFTER USE AS DIRECTED   Ipratropium-Albuterol (COMBIVENT RESPIMAT) 20-100 MCG/ACT AERS respimat Inhale 1 puff into the lungs 4 (four) times daily.   lisinopril (ZESTRIL) 40 MG tablet TAKE 1 TABLET BY MOUTH ONCE DAILY   meclizine (ANTIVERT) 25 MG tablet Take 1 tablet (25 mg total) by mouth 3 (three) times daily as needed for dizziness.   omeprazole (PRILOSEC) 20 MG capsule TAKE 1 CAPSULE BY MOUTH ONCE DAILY   traZODone (DESYREL) 50 MG tablet TAKE 1 TABLET BY MOUTH AT BEDTIME   [DISCONTINUED] traMADol (ULTRAM) 50 MG tablet TAKE 1 TABLET BY MOUTH 3 TIMES DAILY AS NEEDED FOR PAIN   [START ON 04/24/2022] traMADol (ULTRAM) 50 MG tablet TAKE 1 TABLET BY MOUTH 3 TIMES DAILY AS NEEDED FOR PAIN   No facility-administered encounter medications on file as of 04/17/2022.    Surgical History: Past Surgical History:  Procedure Laterality Date   arm surgery[Other] Left    chain saw injury   chronic pain     LEG SURGERY Right     Medical History: Past Medical History:  Diagnosis Date   Anxiety    Chronic pain    back   COPD (chronic obstructive pulmonary disease) (HCC)    Depression    GERD (gastroesophageal reflux disease)    Hyperlipidemia    Hypertension  Stroke [facility])     Family History: Family History  Problem Relation Age of Onset   Stroke Mother    Diabetes Father     Social History   Socioeconomic History   Marital status: Single    Spouse name: Not on file   Number of children: Not on file   Years of education: Not on file   Highest education level: Not on file  Occupational History   Not on file  Tobacco Use   Smoking status: Every Day    Packs/day: 2.00    Years: 30.00    Additional pack years: 0.00    Total pack years: 60.00    Types: Cigarettes   Smokeless tobacco: Never   Tobacco comments:    Smokes 2 packs+ a day  Vaping Use   Vaping Use: Never used   Substance and Sexual Activity   Alcohol use: Not Currently   Drug use: Never   Sexual activity: Not on file  Other Topics Concern   Not on file  Social History Narrative   ** Merged History Encounter **       Social Determinants of Health   Financial Resource Strain: Not on file  Food Insecurity: Not on file  Transportation Needs: Not on file  Physical Activity: Not on file  Stress: Not on file  Social Connections: Not on file  Intimate Partner Violence: Not on file      Review of Systems  Constitutional:  Positive for fatigue. Negative for chills and unexpected weight change.  HENT:  Negative for congestion, postnasal drip, rhinorrhea, sneezing and sore throat.   Eyes:  Positive for visual disturbance. Negative for redness.  Respiratory:  Negative for cough, chest tightness and shortness of breath.   Cardiovascular:  Negative for chest pain and palpitations.  Gastrointestinal:  Negative for abdominal pain, constipation, diarrhea, nausea and vomiting.  Genitourinary:  Negative for dysuria and frequency.  Musculoskeletal:  Positive for arthralgias, back pain and gait problem. Negative for joint swelling and neck pain.  Skin:  Negative for rash.  Neurological:  Negative for tremors and numbness.  Hematological:  Negative for adenopathy. Does not bruise/bleed easily.  Psychiatric/Behavioral:  Negative for sleep disturbance and suicidal ideas. Behavioral problem: Depression.The patient is not nervous/anxious.     Vital Signs: BP (!) 150/95   Pulse 70   Temp 98.7 F (37.1 C)   Resp 16   Ht 6\' 4"  (1.93 m)   Wt 177 lb 12.8 oz (80.6 kg)   SpO2 97%   BMI 21.64 kg/m    Physical Exam Vitals and nursing note reviewed.  Constitutional:      General: He is not in acute distress.    Appearance: He is well-developed and normal weight. He is not diaphoretic.  HENT:     Head: Normocephalic and atraumatic.     Mouth/Throat:     Pharynx: No oropharyngeal exudate.  Eyes:      Pupils: Pupils are equal, round, and reactive to light.  Neck:     Thyroid: No thyromegaly.     Vascular: No JVD.     Trachea: No tracheal deviation.  Cardiovascular:     Rate and Rhythm: Normal rate and regular rhythm.     Heart sounds: Normal heart sounds. No murmur heard.    No friction rub. No gallop.  Pulmonary:     Effort: Pulmonary effort is normal. No respiratory distress.     Breath sounds: No wheezing or rales.  Chest:  Chest wall: No tenderness.  Abdominal:     General: Bowel sounds are normal.     Palpations: Abdomen is soft.  Musculoskeletal:        General: Normal range of motion.     Cervical back: Normal range of motion and neck supple.  Lymphadenopathy:     Cervical: No cervical adenopathy.  Skin:    General: Skin is warm and dry.  Neurological:     Mental Status: He is alert and oriented to person, place, and time.     Cranial Nerves: No cranial nerve deficit.     Gait: Gait abnormal.  Psychiatric:        Behavior: Behavior normal.        Thought Content: Thought content normal.        Judgment: Judgment normal.        Assessment/Plan: 1. Essential hypertension Elevated in office likely due to high salt and caffeine intake and advised to adjust diet and work on smoking cessation,. Will monitor Hypertension Counseling:   The following hypertensive lifestyle modification were recommended and discussed:  1. Limiting alcohol intake to less than 1 oz/day of ethanol:(24 oz of beer or 8 oz of wine or 2 oz of 100-proof whiskey). 2. Take baby ASA 81 mg daily. 3. Importance of regular aerobic exercise and losing weight. 4. Reduce dietary saturated fat and cholesterol intake for overall cardiovascular health. 5. Maintaining adequate dietary potassium, calcium, and magnesium intake. 6. Regular monitoring of the blood pressure. 7. Reduce sodium intake to less than 100 mmol/day (less than 2.3 gm of sodium or less than 6 gm of sodium choride)    2. Chronic  midline low back pain without sciatica - traMADol (ULTRAM) 50 MG tablet; TAKE 1 TABLET BY MOUTH 3 TIMES DAILY AS NEEDED FOR PAIN  Dispense: 90 tablet; Refill: 0  3. Cigarette nicotine dependence with nicotine-induced disorder Smoking cessation counseling: Pt acknowledges the risks of long term smoking, she will try to quite smoking. Options for different medications including nicotine products, chewing gum, patch etc, Wellbutrin and Chantix is discussed Goal and date of compete cessation is discussed Total time spent in smoking cessation is 10 min.    General Counseling: [name] understanding of the findings of todays visit and agrees with plan of treatment. I have discussed any further diagnostic evaluation that may be needed or ordered today. We also reviewed his medications today. he has been encouraged to call the office with any questions or concerns that should arise related to todays visit.    No orders of the defined types were placed in this encounter.   Meds ordered this encounter  Medications   traMADol (ULTRAM) 50 MG tablet    Sig: TAKE 1 TABLET BY MOUTH 3 TIMES DAILY AS NEEDED FOR PAIN    Dispense:  90 tablet    Refill:  0    This patient was seen by Drema Dallas, PA-C in collaboration with Dr. Clayborn Bigness as a part of collaborative care agreement.   Total time spent:30 Minutes Time spent includes review of chart, medications, test results, and follow up plan with the patient.      Dr Lavera Guise Internal medicine

## 2022-05-10 ENCOUNTER — Other Ambulatory Visit: Payer: Self-pay | Admitting: Physician Assistant

## 2022-05-15 ENCOUNTER — Other Ambulatory Visit: Payer: Self-pay | Admitting: Physician Assistant

## 2022-05-15 DIAGNOSIS — M545 Other chronic pain: Secondary | ICD-10-CM

## 2022-05-23 ENCOUNTER — Other Ambulatory Visit: Payer: Self-pay | Admitting: Physician Assistant

## 2022-05-23 DIAGNOSIS — M545 Low back pain, unspecified: Secondary | ICD-10-CM

## 2022-05-23 NOTE — Telephone Encounter (Signed)
Last 3/24and 6/24

## 2022-05-31 ENCOUNTER — Ambulatory Visit: Payer: Medicare HMO | Admitting: Internal Medicine

## 2022-06-09 ENCOUNTER — Telehealth: Payer: Self-pay | Admitting: Physician Assistant

## 2022-06-09 NOTE — Telephone Encounter (Signed)
01/23/21 MR uploaded to [email or web address]

## 2022-06-14 ENCOUNTER — Other Ambulatory Visit: Payer: Self-pay | Admitting: Physician Assistant

## 2022-06-14 DIAGNOSIS — F17219 Nicotine dependence, cigarettes, with unspecified nicotine-induced disorders: Secondary | ICD-10-CM

## 2022-07-12 ENCOUNTER — Other Ambulatory Visit: Payer: Self-pay | Admitting: Physician Assistant

## 2022-07-12 DIAGNOSIS — I6340 Cerebral infarction due to embolism of unspecified cerebral artery: Secondary | ICD-10-CM

## 2022-07-20 ENCOUNTER — Ambulatory Visit: Payer: Medicare HMO | Admitting: Physician Assistant

## 2022-07-24 ENCOUNTER — Encounter: Payer: Self-pay | Admitting: Physician Assistant

## 2022-07-24 ENCOUNTER — Ambulatory Visit (INDEPENDENT_AMBULATORY_CARE_PROVIDER_SITE_OTHER): Payer: Medicare HMO | Admitting: Physician Assistant

## 2022-07-24 VITALS — BP 128/84 | HR 92 | Temp 97.8°F | Resp 16 | Ht 76.0 in | Wt 168.0 lb

## 2022-07-24 DIAGNOSIS — E782 Mixed hyperlipidemia: Secondary | ICD-10-CM | POA: Diagnosis not present

## 2022-07-24 DIAGNOSIS — I10 Essential (primary) hypertension: Secondary | ICD-10-CM | POA: Diagnosis not present

## 2022-07-24 DIAGNOSIS — F332 Major depressive disorder, recurrent severe without psychotic features: Secondary | ICD-10-CM | POA: Diagnosis not present

## 2022-07-24 DIAGNOSIS — E042 Nontoxic multinodular goiter: Secondary | ICD-10-CM

## 2022-07-24 DIAGNOSIS — F17219 Nicotine dependence, cigarettes, with unspecified nicotine-induced disorders: Secondary | ICD-10-CM

## 2022-07-24 DIAGNOSIS — R5383 Other fatigue: Secondary | ICD-10-CM

## 2022-07-24 MED ORDER — BUPROPION HCL ER (XL) 300 MG PO TB24: 300.0000 mg | ORAL_TABLET | Freq: Every day | ORAL | 1 refills | Status: DC

## 2022-07-24 NOTE — Progress Notes (Signed)
[facility] [address] [place], [place] [address]  Internal MEDICINE  Office Visit Note  Patient Name: Gerald Martin  [id number]  [id number]  Date of Service: 07/24/2022  Chief Complaint  Patient presents with   Follow-up   Hypertension   Depression    HPI Pt is here for routine follow up -not sleeping well, due to pick up trazodone today and is likely the cause. Has been without it about a week -has been feeling more depressed lately. Past few weeks or so. States some things have been going on and have contributed. Discussed increasing wellbutrin and pt would like to try this. -Has not been moving much lately, encouraged to increase activity level during daytime and only sleep at night to keep good sleep schedule -blood work never done and will reorder again today. May need GI follow up for previous hepatitis but will check LFTs on blood work first. Had been seeing Dr. Servando Snare but never follow up again. Also due for colonoscopy and was referred previously but never scheduled. Will need to follow up on this. Also still needs CT chest for lung screening. May need to establish with pulmonary -still smoking 1ppd  Current Medication: Outpatient Encounter Medications as of 07/24/2022  Medication Sig   amLODipine (NORVASC) 10 MG tablet TAKE 1 TABLET BY MOUTH ONCE DAILY   ASPIRIN LOW DOSE 81 MG EC tablet TAKE 1 TABLET BY MOUTH ONCE DAILY   atorvastatin (LIPITOR) 40 MG tablet TAKE 1 TABLET BY MOUTH ONCE DAILY AT 6:00PM   buPROPion (WELLBUTRIN XL) 300 MG 24 hr tablet Take 1 tablet (300 mg total) by mouth daily.   carvedilol (COREG) 6.25 MG tablet TAKE 1 TABLET BY MOUTH TWICE DAILY WITH A MEAL   clopidogrel (PLAVIX) 75 MG tablet TAKE 1 TABLET BY MOUTH ONCE DAILY   COMBIVENT RESPIMAT 20-100 MCG/ACT AERS respimat Inhale 1 puff into the lungs 4 (four) times daily.   FLUoxetine (PROZAC) 40 MG capsule TAKE 1 CAPSULE BY MOUTH ONCE DAILY   INCRUSE ELLIPTA 62.5 MCG/ACT AEPB INHALE 1  PUFF INTO THE LUNGS ONCE DAILY.*RINSE MOUTH AFTER USE AS DIRECTED   Ipratropium-Albuterol (COMBIVENT RESPIMAT) 20-100 MCG/ACT AERS respimat Inhale 1 puff into the lungs 4 (four) times daily.   lisinopril (ZESTRIL) 40 MG tablet TAKE 1 TABLET BY MOUTH ONCE DAILY   meclizine (ANTIVERT) 25 MG tablet Take 1 tablet (25 mg total) by mouth 3 (three) times daily as needed for dizziness.   omeprazole (PRILOSEC) 20 MG capsule TAKE 1 CAPSULE BY MOUTH ONCE DAILY   traMADol (ULTRAM) 50 MG tablet TAKE 1 TABLET BY MOUTH 3 TIMES DAILY AS NEEDED FOR PAIN   traZODone (DESYREL) 50 MG tablet TAKE 1 TABLET BY MOUTH AT BEDTIME   [DISCONTINUED] buPROPion (WELLBUTRIN XL) 150 MG 24 hr tablet TAKE 1 TABLET BY MOUTH ONCE DAILY   No facility-administered encounter medications on file as of 07/24/2022.    Surgical History: Past Surgical History:  Procedure Laterality Date   arm surgery[Other] Left    chain saw injury   chronic pain     LEG SURGERY Right     Medical History: Past Medical History:  Diagnosis Date   Anxiety    Chronic pain    back   COPD (chronic obstructive pulmonary disease) (HCC)    Depression    GERD (gastroesophageal reflux disease)    Hyperlipidemia    Hypertension    Stroke [facility])     Family History: Family History  Problem Relation Age of  Onset   Stroke Mother    Diabetes Father     Social History   Socioeconomic History   Marital status: Single    Spouse name: Not on file   Number of children: Not on file   Years of education: Not on file   Highest education level: Not on file  Occupational History   Not on file  Tobacco Use   Smoking status: Every Day    Packs/day: 2.00    Years: 30.00    Additional pack years: 0.00    Total pack years: 60.00    Types: Cigarettes   Smokeless tobacco: Never   Tobacco comments:    Smokes 2 packs+ a day  Vaping Use   Vaping Use: Never used  Substance and Sexual Activity   Alcohol use: Not Currently   Drug use: Never   Sexual  activity: Not on file  Other Topics Concern   Not on file  Social History Narrative   ** Merged History Encounter **       Social Determinants of Health   Financial Resource Strain: Not on file  Food Insecurity: Not on file  Transportation Needs: Not on file  Physical Activity: Not on file  Stress: Not on file  Social Connections: Not on file  Intimate Partner Violence: Not on file      Review of Systems  Constitutional:  Positive for fatigue. Negative for chills and unexpected weight change.  HENT:  Negative for congestion, postnasal drip, rhinorrhea, sneezing and sore throat.   Eyes:  Positive for visual disturbance. Negative for redness.  Respiratory:  Negative for cough, chest tightness and shortness of breath.   Cardiovascular:  Negative for chest pain and palpitations.  Gastrointestinal:  Negative for abdominal pain, constipation, diarrhea, nausea and vomiting.  Genitourinary:  Negative for dysuria and frequency.  Musculoskeletal:  Positive for arthralgias, back pain and gait problem. Negative for joint swelling and neck pain.  Skin:  Negative for rash.  Neurological:  Negative for tremors and numbness.  Hematological:  Negative for adenopathy. Does not bruise/bleed easily.  Psychiatric/Behavioral:  Positive for sleep disturbance. Negative for suicidal ideas. Behavioral problem: Depression.The patient is not nervous/anxious.     Vital Signs: BP 128/84   Pulse 92   Temp 97.8 F (36.6 C)   Resp 16   Ht 6\' 4"  (1.93 m)   Wt 168 lb (76.2 kg)   SpO2 97%   BMI 20.45 kg/m    Physical Exam Vitals and nursing note reviewed.  Constitutional:      General: He is not in acute distress.    Appearance: He is well-developed and normal weight. He is not diaphoretic.  HENT:     Head: Normocephalic and atraumatic.     Mouth/Throat:     Pharynx: No oropharyngeal exudate.  Eyes:     Pupils: Pupils are equal, round, and reactive to light.  Neck:     Thyroid: No  thyromegaly.     Vascular: No JVD.     Trachea: No tracheal deviation.  Cardiovascular:     Rate and Rhythm: Normal rate and regular rhythm.     Heart sounds: Normal heart sounds. No murmur heard.    No friction rub. No gallop.  Pulmonary:     Effort: Pulmonary effort is normal. No respiratory distress.     Breath sounds: No wheezing or rales.  Chest:     Chest wall: No tenderness.  Abdominal:     General: Bowel sounds are normal.  Palpations: Abdomen is soft.  Musculoskeletal:        General: Normal range of motion.     Cervical back: Normal range of motion and neck supple.  Lymphadenopathy:     Cervical: No cervical adenopathy.  Skin:    General: Skin is warm and dry.  Neurological:     Mental Status: He is alert and oriented to person, place, and time.     Cranial Nerves: No cranial nerve deficit.     Gait: Gait abnormal.  Psychiatric:        Thought Content: Thought content normal.        Judgment: Judgment normal.        Assessment/Plan: 1. Essential hypertension Stable, continue current medication  2. Mixed hyperlipidemia - Lipid Panel With LDL/HDL Ratio  3. Severe recurrent major depression without psychotic features (HCC) Will increase wellbutrin to 300mg  and continue prozac as before  4. Multiple thyroid nodules Previously biopsied by endo and benign, but will update labs - TSH + free T4  5. Cigarette nicotine dependence with nicotine-induced disorder Continue to work on smoking cessation. Needs to reschedule CT chest lung cancer screening. May need to establish with pulmonology  6. Other fatigue - CBC w/Diff/Platelet - Comprehensive metabolic panel - TSH + free T4 - Lipid Panel With LDL/HDL Ratio   General Counseling: Constantinos verbalizes understanding of the findings of todays visit and agrees with plan of treatment. I have discussed any further diagnostic evaluation that may be needed or ordered today. We also reviewed his medications today. he has  been encouraged to call the office with any questions or concerns that should arise related to todays visit.    Orders Placed This Encounter  Procedures   CBC w/Diff/Platelet   Comprehensive metabolic panel   TSH + free T4   Lipid Panel With LDL/HDL Ratio    Meds ordered this encounter  Medications   buPROPion (WELLBUTRIN XL) 300 MG 24 hr tablet    Sig: Take 1 tablet (300 mg total) by mouth daily.    Dispense:  90 tablet    Refill:  1    This patient was seen by Lynn Ito, PA-C in collaboration with Dr. Beverely Risen as a part of collaborative care agreement.   Total time spent:30 Minutes Time spent includes review of chart, medications, test results, and follow up plan with the patient.      Dr Lyndon Code Internal medicine

## 2022-08-15 ENCOUNTER — Other Ambulatory Visit: Payer: Self-pay | Admitting: Internal Medicine

## 2022-08-15 ENCOUNTER — Other Ambulatory Visit: Payer: Self-pay | Admitting: Physician Assistant

## 2022-08-15 DIAGNOSIS — I10 Essential (primary) hypertension: Secondary | ICD-10-CM

## 2022-08-15 DIAGNOSIS — E782 Mixed hyperlipidemia: Secondary | ICD-10-CM

## 2022-08-15 DIAGNOSIS — I6340 Cerebral infarction due to embolism of unspecified cerebral artery: Secondary | ICD-10-CM

## 2022-09-18 ENCOUNTER — Other Ambulatory Visit: Payer: Self-pay | Admitting: Physician Assistant

## 2022-10-02 ENCOUNTER — Telehealth: Payer: Self-pay | Admitting: Physician Assistant

## 2022-10-02 ENCOUNTER — Ambulatory Visit: Payer: Medicare HMO | Admitting: Physician Assistant

## 2022-10-02 NOTE — Telephone Encounter (Signed)
Called pt to reschedule missed appt on 10/02/22

## 2022-10-04 ENCOUNTER — Telehealth: Payer: Self-pay | Admitting: Physician Assistant

## 2022-10-04 NOTE — Telephone Encounter (Signed)
Pt returned call back to reschedule appt

## 2022-10-17 ENCOUNTER — Other Ambulatory Visit: Payer: Self-pay | Admitting: Physician Assistant

## 2022-10-17 DIAGNOSIS — I161 Hypertensive emergency: Secondary | ICD-10-CM

## 2022-10-25 ENCOUNTER — Other Ambulatory Visit: Payer: Self-pay | Admitting: Physician Assistant

## 2022-10-25 DIAGNOSIS — G8929 Other chronic pain: Secondary | ICD-10-CM

## 2022-11-13 ENCOUNTER — Ambulatory Visit: Payer: Medicare HMO | Admitting: Physician Assistant

## 2022-11-18 ENCOUNTER — Other Ambulatory Visit: Payer: Self-pay | Admitting: Physician Assistant

## 2022-11-18 DIAGNOSIS — F332 Major depressive disorder, recurrent severe without psychotic features: Secondary | ICD-10-CM

## 2022-11-22 ENCOUNTER — Other Ambulatory Visit: Payer: Self-pay | Admitting: Physician Assistant

## 2022-11-22 DIAGNOSIS — G8929 Other chronic pain: Secondary | ICD-10-CM

## 2022-11-24 ENCOUNTER — Ambulatory Visit: Payer: Medicare HMO | Admitting: Physician Assistant

## 2022-11-29 LAB — LIPID PANEL WITH LDL/HDL RATIO
Cholesterol, Total: 135 mg/dL (ref 100–199)
HDL: 67 mg/dL (ref >39)
LDL Chol Calc (NIH): 50 mg/dL (ref 0–99)
LDL/HDL Ratio: 0.7 {ratio} (ref 0.0–3.6)
Triglycerides: 99 mg/dL (ref 0–149)
VLDL Cholesterol Cal: 18 mg/dL (ref 5–40)

## 2022-11-29 LAB — COMPREHENSIVE METABOLIC PANEL
ALT: 31 [IU]/L (ref 0–44)
AST: 22 [IU]/L (ref 0–40)
Albumin: 3.9 g/dL (ref 3.8–4.9)
Alkaline Phosphatase: 104 [IU]/L (ref 44–121)
BUN/Creatinine Ratio: 15 (ref 9–20)
BUN: 16 mg/dL (ref 6–24)
Bilirubin Total: 0.6 mg/dL (ref 0.0–1.2)
CO2: 27 mmol/L (ref 20–29)
Calcium: 9.2 mg/dL (ref 8.7–10.2)
Chloride: 100 mmol/L (ref 96–106)
Creatinine, Ser: 1.1 mg/dL (ref 0.76–1.27)
Globulin, Total: 2.8 g/dL (ref 1.5–4.5)
Glucose: 99 mg/dL (ref 70–99)
Potassium: 4.3 mmol/L (ref 3.5–5.2)
Sodium: 139 mmol/L (ref 134–144)
Total Protein: 6.7 g/dL (ref 6.0–8.5)
eGFR: 79 mL/min/{1.73_m2} (ref >59)

## 2022-11-29 LAB — CBC WITH DIFFERENTIAL/PLATELET
Basophils Absolute: 0.1 10*3/uL (ref 0.0–0.2)
Basos: 1 %
EOS (ABSOLUTE): 0.3 10*3/uL (ref 0.0–0.4)
Eos: 4 %
Hematocrit: 47.1 % (ref 37.5–51.0)
Hemoglobin: 15.1 g/dL (ref 13.0–17.7)
Immature Grans (Abs): 0 10*3/uL (ref 0.0–0.1)
Immature Granulocytes: 0 %
Lymphocytes Absolute: 1.1 10*3/uL (ref 0.7–3.1)
Lymphs: 14 %
MCH: 31.3 pg (ref 26.6–33.0)
MCHC: 32.1 g/dL (ref 31.5–35.7)
MCV: 98 fL — ABNORMAL HIGH (ref 79–97)
Monocytes Absolute: 0.6 10*3/uL (ref 0.1–0.9)
Monocytes: 9 %
Neutrophils Absolute: 5.4 10*3/uL (ref 1.4–7.0)
Neutrophils: 72 %
Platelets: 282 10*3/uL (ref 150–450)
RBC: 4.82 x10E6/uL (ref 4.14–5.80)
RDW: 12.3 % (ref 11.6–15.4)
WBC: 7.4 10*3/uL (ref 3.4–10.8)

## 2022-11-29 LAB — TSH+FREE T4
Free T4: 1.44 ng/dL (ref 0.82–1.77)
TSH: 1.03 u[IU]/mL (ref 0.450–4.500)

## 2022-12-04 ENCOUNTER — Ambulatory Visit (INDEPENDENT_AMBULATORY_CARE_PROVIDER_SITE_OTHER): Payer: Medicare HMO | Admitting: Physician Assistant

## 2022-12-04 ENCOUNTER — Encounter: Payer: Self-pay | Admitting: Physician Assistant

## 2022-12-04 VITALS — BP 110/80 | HR 64 | Temp 98.1°F | Resp 16 | Ht 76.0 in | Wt 195.4 lb

## 2022-12-04 DIAGNOSIS — Z0000 Encounter for general adult medical examination without abnormal findings: Secondary | ICD-10-CM | POA: Diagnosis not present

## 2022-12-04 DIAGNOSIS — Z1211 Encounter for screening for malignant neoplasm of colon: Secondary | ICD-10-CM

## 2022-12-04 DIAGNOSIS — F332 Major depressive disorder, recurrent severe without psychotic features: Secondary | ICD-10-CM

## 2022-12-04 DIAGNOSIS — F17219 Nicotine dependence, cigarettes, with unspecified nicotine-induced disorders: Secondary | ICD-10-CM

## 2022-12-04 DIAGNOSIS — J439 Emphysema, unspecified: Secondary | ICD-10-CM | POA: Diagnosis not present

## 2022-12-04 DIAGNOSIS — Z1212 Encounter for screening for malignant neoplasm of rectum: Secondary | ICD-10-CM

## 2022-12-04 NOTE — Progress Notes (Signed)
[facility] [address] [place], [place] [address]  Internal MEDICINE  Office Visit Note  Patient Name: Gerald Martin  [id number]  [id number]  Date of Service: 12/04/2022  Chief Complaint  Patient presents with   Medicare Wellness   Depression   Gastroesophageal Reflux   Hypertension   Hyperlipidemia   Quality Metric Gaps    Colonoscopy    HPI Gerald Martin presents for an annual well visit Well-appearing 57 y.o. male Routine CRC screening: due, did not schedule last referral and will send new referral Eye exam: Will be having cataract surgery soon Labs: overall look ok, but ferritin low--not taking any iron and may add OTC supplement New or worsening pain: Some overall muscle soreness. Admits he hasn't been very active lately. Mainly along both shoulder blades/mid-upper back and may be more posture-related Other concerns: never went for CT chest lung screening. Still smoking 1ppd. Some wheezing. Will order this again along with PFT and have pulmonary follow up. -Also continues to have some depression despite increasing wellbutrin and taking prozac. Describes wanting to sleep more and aid pointed out to him that he needs to open blinds and get some sunlight. Keeps room dark which doesn't help. Discussed getting outside to get some activity and fresh air. Will go ahead and refer to psych at this time.     12/04/2022    9:13 AM 11/28/2021    8:59 AM 07/31/2019    1:47 PM  MMSE - Mini Mental State Exam  Orientation to time 3 5 5   Orientation to Place 4 5 5   Registration 2 2 3   Attention/ Calculation 5 5 5   Recall 1 2 3   Language- name 2 objects 2 2 2   Language- repeat 1 1 1   Language- follow 3 step command 3 3 3   Language- read & follow direction 1 1 1   Write a sentence 1 1 1   Copy design 1 1 1   Total score 24 28 30     Functional Status Survey: Is the patient deaf or have difficulty hearing?: No Does the patient have difficulty seeing, even when wearing  glasses/contacts?: Yes Does the patient have difficulty concentrating, remembering, or making decisions?: No Does the patient have difficulty walking or climbing stairs?: No Does the patient have difficulty dressing or bathing?: No Does the patient have difficulty doing errands alone such as visiting a doctor's office or shopping?: No     05/23/2021    1:43 PM 11/28/2021    8:58 AM 04/17/2022   10:40 AM 07/24/2022    9:43 AM 12/04/2022    9:13 AM  Fall Risk  Falls in the past year? 0 1 1 0 0  Was there an injury with Fall?  1 0    Fall Risk Category Calculator  2 1    Fall Risk Category (Retired)  Moderate     Patient at Risk for Falls Due to   No Fall Risks No Fall Risks   Fall risk Follow up   Falls evaluation completed Falls evaluation completed        07/24/2022    9:43 AM  Depression screen PHQ 2/9  Decreased Interest 0  Down, Depressed, Hopeless 0  PHQ - 2 Score 0     Current Medication: Outpatient Encounter Medications as of 12/04/2022  Medication Sig   amLODipine (NORVASC) 10 MG tablet TAKE 1 TABLET BY MOUTH ONCE DAILY   ASPIRIN LOW DOSE 81 MG EC tablet TAKE 1 TABLET BY MOUTH ONCE DAILY  atorvastatin (LIPITOR) 40 MG tablet TAKE 1 TABLET BY MOUTH ONCE DAILY AT 6:00PM   buPROPion (WELLBUTRIN XL) 300 MG 24 hr tablet Take 1 tablet (300 mg total) by mouth daily.   carvedilol (COREG) 6.25 MG tablet TAKE 1 TABLET BY MOUTH TWICE DAILY WITH A MEAL   clopidogrel (PLAVIX) 75 MG tablet TAKE 1 TABLET BY MOUTH ONCE DAILY   COMBIVENT RESPIMAT 20-100 MCG/ACT AERS respimat Inhale 1 puff into the lungs 4 (four) times daily.   FLUoxetine (PROZAC) 40 MG capsule TAKE 1 CAPSULE BY MOUTH ONCE DAILY   INCRUSE ELLIPTA 62.5 MCG/ACT AEPB INHALE 1 PUFF INTO THE LUNGS ONCE DAILY.*RINSE MOUTH AFTER USE AS DIRECTED   Ipratropium-Albuterol (COMBIVENT RESPIMAT) 20-100 MCG/ACT AERS respimat Inhale 1 puff into the lungs 4 (four) times daily.   lisinopril (ZESTRIL) 40 MG tablet TAKE 1 TABLET BY MOUTH ONCE  DAILY   meclizine (ANTIVERT) 25 MG tablet Take 1 tablet (25 mg total) by mouth 3 (three) times daily as needed for dizziness.   omeprazole (PRILOSEC) 20 MG capsule TAKE 1 CAPSULE BY MOUTH ONCE DAILY   traMADol (ULTRAM) 50 MG tablet TAKE 1 TABLET BY MOUTH 1-2 times DAILY AS NEEDED FOR PAIN   traZODone (DESYREL) 50 MG tablet TAKE 1 TABLET BY MOUTH AT BEDTIME   [DISCONTINUED] amLODipine (NORVASC) 10 MG tablet TAKE 1 TABLET BY MOUTH ONCE DAILY   [DISCONTINUED] atorvastatin (LIPITOR) 40 MG tablet TAKE 1 TABLET BY MOUTH ONCE DAILY AT 6:00PM   [DISCONTINUED] carvedilol (COREG) 6.25 MG tablet TAKE 1 TABLET BY MOUTH TWICE DAILY WITH A MEAL   [DISCONTINUED] FLUoxetine (PROZAC) 40 MG capsule TAKE 1 CAPSULE BY MOUTH ONCE DAILY   [DISCONTINUED] INCRUSE ELLIPTA 62.5 MCG/ACT AEPB INHALE 1 PUFF INTO THE LUNGS ONCE DAILY.*RINSE MOUTH AFTER USE AS DIRECTED   [DISCONTINUED] lisinopril (ZESTRIL) 40 MG tablet TAKE 1 TABLET BY MOUTH ONCE DAILY   [DISCONTINUED] omeprazole (PRILOSEC) 20 MG capsule TAKE 1 CAPSULE BY MOUTH ONCE DAILY   [DISCONTINUED] traMADol (ULTRAM) 50 MG tablet TAKE 1 TABLET BY MOUTH 3 TIMES DAILY AS NEEDED FOR PAIN   [DISCONTINUED] traZODone (DESYREL) 50 MG tablet TAKE 1 TABLET BY MOUTH AT BEDTIME   No facility-administered encounter medications on file as of 12/04/2022.    Surgical History: Past Surgical History:  Procedure Laterality Date   arm surgery[Other] Left    chain saw injury   chronic pain     LEG SURGERY Right     Medical History: Past Medical History:  Diagnosis Date   Anxiety    Chronic pain    back   COPD (chronic obstructive pulmonary disease) (HCC)    Depression    GERD (gastroesophageal reflux disease)    Hyperlipidemia    Hypertension    Stroke [facility])     Family History: Family History  Problem Relation Age of Onset   Stroke Mother    Diabetes Father     Social History   Socioeconomic History   Marital status: Single    Spouse name: Not on file    Number of children: Not on file   Years of education: Not on file   Highest education level: Not on file  Occupational History   Not on file  Tobacco Use   Smoking status: Every Day    Current packs/day: 2.00    Average packs/day: 2.0 packs/day for 30.0 years (60.0 ttl pk-yrs)    Types: Cigarettes   Smokeless tobacco: Never   Tobacco comments:    Smokes 2 packs+ a  day  Vaping Use   Vaping status: Never Used  Substance and Sexual Activity   Alcohol use: Not Currently   Drug use: Never   Sexual activity: Not on file  Other Topics Concern   Not on file  Social History Narrative   ** Merged History Encounter **       Social Determinants of Health   Financial Resource Strain: Not on file  Food Insecurity: Not on file  Transportation Needs: Not on file  Physical Activity: Not on file  Stress: Not on file  Social Connections: Not on file  Intimate Partner Violence: Not on file      Review of Systems  Constitutional:  Positive for fatigue. Negative for chills and unexpected weight change.  HENT:  Negative for congestion, postnasal drip, rhinorrhea, sneezing and sore throat.   Eyes:  Positive for visual disturbance. Negative for redness.  Respiratory:  Negative for cough, chest tightness and shortness of breath.   Cardiovascular:  Negative for chest pain and palpitations.  Gastrointestinal:  Negative for abdominal pain, constipation, diarrhea, nausea and vomiting.  Genitourinary:  Negative for dysuria and frequency.  Musculoskeletal:  Positive for arthralgias, back pain and gait problem. Negative for joint swelling and neck pain.  Skin:  Negative for rash.  Neurological:  Negative for tremors and numbness.  Hematological:  Negative for adenopathy. Does not bruise/bleed easily.  Psychiatric/Behavioral:  Positive for dysphoric mood and sleep disturbance. Negative for self-injury and suicidal ideas. Behavioral problem: Depression.The patient is not nervous/anxious.     Vital  Signs: BP 110/80   Pulse 64   Temp 98.1 F (36.7 C)   Resp 16   Ht 6\' 4"  (1.93 m)   Wt 195 lb 6.4 oz (88.6 kg)   SpO2 95%   BMI 23.78 kg/m    Physical Exam Vitals and nursing note reviewed.  Constitutional:      Appearance: Normal appearance.  HENT:     Head: Normocephalic and atraumatic.  Cardiovascular:     Rate and Rhythm: Normal rate and regular rhythm.  Pulmonary:     Effort: Pulmonary effort is normal.     Breath sounds: Normal breath sounds.  Musculoskeletal:        General: Tenderness present.     Comments: Tenderness along both shoulder blades  Neurological:     Mental Status: He is alert.  Psychiatric:        Behavior: Behavior normal.        Assessment/Plan: 1. Encounter for Medicare annual wellness exam AWV performed, labs reviewed, due for screenings and will reorder  2. Cigarette nicotine dependence with nicotine-induced disorder - CT CHEST LUNG CA SCREEN LOW DOSE W/O CM; Future - Pulmonary Function Test; Future  3. Pulmonary emphysema, unspecified emphysema type (HCC) - Pulmonary Function Test; Future  4. Severe episode of recurrent major depressive disorder, without psychotic features (HCC) - Ambulatory referral to Psychiatry  5. Screening for colorectal cancer - Ambulatory referral to Gastroenterology     General Counseling: Gerald Martin understanding of the findings of todays visit and agrees with plan of treatment. I have discussed any further diagnostic evaluation that may be needed or ordered today. We also reviewed his medications today. he has been encouraged to call the office with any questions or concerns that should arise related to todays visit.    Orders Placed This Encounter  Procedures   CT CHEST LUNG CA SCREEN LOW DOSE W/O CM   Ambulatory referral to Gastroenterology   Ambulatory referral to  Psychiatry   Pulmonary Function Test    No orders of the defined types were placed in this encounter.   Return in about 3  months (around 03/06/2023) for PCP, Needs pulm visit with [employer] after testing, needs to coordinate with aide for tests/referrals.   Total time spent:35 Minutes Time spent includes review of chart, medications, test results, and follow up plan with the patient.   [place] Controlled Substance Database was reviewed by me.  This patient was seen by Lynn Ito, PA-C in collaboration with Dr. Beverely Risen as a part of collaborative care agreement.  Lynn Ito, PA-C Internal medicine

## 2022-12-13 ENCOUNTER — Encounter: Payer: Self-pay | Admitting: *Deleted

## 2022-12-13 LAB — PSA: Prostate Specific Ag, Serum: 2.6 ng/mL (ref 0.0–4.0)

## 2022-12-13 LAB — B12 AND FOLATE PANEL
Folate: 9.9 ng/mL (ref >3.0)
Vitamin B-12: 505 pg/mL (ref 232–1245)

## 2022-12-13 LAB — FERRITIN: Ferritin: 29 ng/mL — ABNORMAL LOW (ref 30–400)

## 2022-12-13 LAB — IRON AND TIBC
Iron Saturation: 22 % (ref 15–55)
Iron: 80 ug/dL (ref 38–169)
Total Iron Binding Capacity: 359 ug/dL (ref 250–450)
UIBC: 279 ug/dL (ref 111–343)

## 2022-12-13 LAB — SPECIMEN STATUS REPORT

## 2022-12-18 ENCOUNTER — Ambulatory Visit
Admission: RE | Admit: 2022-12-18 | Discharge: 2022-12-18 | Disposition: A | Discharge status: Home / Self Care | Payer: Medicare HMO | Source: Ambulatory Visit | Attending: Physician Assistant

## 2022-12-18 DIAGNOSIS — F17219 Nicotine dependence, cigarettes, with unspecified nicotine-induced disorders: Secondary | ICD-10-CM

## 2022-12-20 ENCOUNTER — Ambulatory Visit (INDEPENDENT_AMBULATORY_CARE_PROVIDER_SITE_OTHER): Payer: Medicare HMO | Admitting: Internal Medicine

## 2022-12-20 DIAGNOSIS — F17219 Nicotine dependence, cigarettes, with unspecified nicotine-induced disorders: Secondary | ICD-10-CM | POA: Diagnosis not present

## 2022-12-20 DIAGNOSIS — J439 Emphysema, unspecified: Secondary | ICD-10-CM

## 2022-12-25 ENCOUNTER — Encounter: Payer: Self-pay | Admitting: Anesthesiology

## 2022-12-25 ENCOUNTER — Encounter: Payer: Self-pay | Admitting: Ophthalmology

## 2022-12-25 ENCOUNTER — Other Ambulatory Visit: Payer: Self-pay

## 2023-01-02 ENCOUNTER — Ambulatory Visit: Admission: RE | Admit: 2023-01-02 | Payer: Medicare HMO | Source: Home / Self Care | Admitting: Ophthalmology

## 2023-01-02 SURGERY — CATARACT EXTRACTION PHACO AND INTRAOCULAR LENS PLACEMENT (IOC)
Anesthesia: Topical | Laterality: Right

## 2023-01-04 ENCOUNTER — Encounter: Payer: Self-pay | Admitting: Physician Assistant

## 2023-01-04 ENCOUNTER — Ambulatory Visit: Payer: Medicare HMO | Admitting: Physician Assistant

## 2023-01-04 VITALS — BP 125/70 | HR 85 | Temp 97.9°F | Resp 16 | Ht 76.0 in | Wt 198.0 lb

## 2023-01-04 DIAGNOSIS — J439 Emphysema, unspecified: Secondary | ICD-10-CM | POA: Diagnosis not present

## 2023-01-04 DIAGNOSIS — F17219 Nicotine dependence, cigarettes, with unspecified nicotine-induced disorders: Secondary | ICD-10-CM

## 2023-01-04 NOTE — Progress Notes (Signed)
[facility] [address] [place], [place] [address]  Internal MEDICINE  Office Visit Note  Patient Name: Gerald Martin  [id number]  [id number]  Date of Service: 01/04/2023  Chief Complaint  Patient presents with   Follow-up    Review PFT and CT    HPI Pt is here for routine follow up -unfortunately CT chest results unavailable at this time -PFT shows severe COPD, but also poor effort with report of pt havign difficulty with maneuvers -Pt does not use inhalers as prescribred, discussed trelegy/Breztri options, but pt would like to continue with current for now and just start using as prescribed. Discussed establishing with pulmonology and will schedule appt with Dr. Welton Flakes for this -again discussed smoking cessation  Current Medication: Outpatient Encounter Medications as of 01/04/2023  Medication Sig   amLODipine (NORVASC) 10 MG tablet TAKE 1 TABLET BY MOUTH ONCE DAILY   ASPIRIN LOW DOSE 81 MG EC tablet TAKE 1 TABLET BY MOUTH ONCE DAILY   atorvastatin (LIPITOR) 40 MG tablet TAKE 1 TABLET BY MOUTH ONCE DAILY AT 6:00PM   buPROPion (WELLBUTRIN XL) 300 MG 24 hr tablet Take 1 tablet (300 mg total) by mouth daily.   carvedilol (COREG) 6.25 MG tablet TAKE 1 TABLET BY MOUTH TWICE DAILY WITH A MEAL   clopidogrel (PLAVIX) 75 MG tablet TAKE 1 TABLET BY MOUTH ONCE DAILY   COMBIVENT RESPIMAT 20-100 MCG/ACT AERS respimat Inhale 1 puff into the lungs 4 (four) times daily.   FLUoxetine (PROZAC) 40 MG capsule TAKE 1 CAPSULE BY MOUTH ONCE DAILY   INCRUSE ELLIPTA 62.5 MCG/ACT AEPB INHALE 1 PUFF INTO THE LUNGS ONCE DAILY.*RINSE MOUTH AFTER USE AS DIRECTED   Ipratropium-Albuterol (COMBIVENT RESPIMAT) 20-100 MCG/ACT AERS respimat Inhale 1 puff into the lungs 4 (four) times daily.   lisinopril (ZESTRIL) 40 MG tablet TAKE 1 TABLET BY MOUTH ONCE DAILY   meclizine (ANTIVERT) 25 MG tablet Take 1 tablet (25 mg total) by mouth 3 (three) times daily as needed for dizziness.   omeprazole  (PRILOSEC) 20 MG capsule TAKE 1 CAPSULE BY MOUTH ONCE DAILY   traMADol (ULTRAM) 50 MG tablet TAKE 1 TABLET BY MOUTH 1-2 times DAILY AS NEEDED FOR PAIN   traZODone (DESYREL) 50 MG tablet TAKE 1 TABLET BY MOUTH AT BEDTIME   No facility-administered encounter medications on file as of 01/04/2023.    Surgical History: Past Surgical History:  Procedure Laterality Date   arm surgery[Other] Left    chain saw injury   chronic pain     LEG SURGERY Right     Medical History: Past Medical History:  Diagnosis Date   Anxiety    Chronic pain    back   COPD (chronic obstructive pulmonary disease) (HCC)    Depression    GERD (gastroesophageal reflux disease)    Hyperlipidemia    Hypertension    Stroke [facility])     Family History: Family History  Problem Relation Age of Onset   Stroke Mother    Diabetes Father     Social History   Socioeconomic History   Marital status: Single    Spouse name: Not on file   Number of children: Not on file   Years of education: Not on file   Highest education level: Not on file  Occupational History   Not on file  Tobacco Use   Smoking status: Every Day    Current packs/day: 2.00    Average packs/day: 2.0 packs/day for 30.0 years (60.0 ttl pk-yrs)  Types: Cigarettes   Smokeless tobacco: Never   Tobacco comments:    1/2PPD  Vaping Use   Vaping status: Never Used  Substance and Sexual Activity   Alcohol use: Not Currently   Drug use: Never   Sexual activity: Not on file  Other Topics Concern   Not on file  Social History Narrative   ** Merged History Encounter **       Social Drivers of Corporate investment banker Strain: Not on file  Food Insecurity: Not on file  Transportation Needs: Not on file  Physical Activity: Not on file  Stress: Not on file  Social Connections: Not on file  Intimate Partner Violence: Not on file      Review of Systems  Constitutional:  Positive for fatigue.  HENT:  Negative for sinus pressure.    Respiratory:  Positive for shortness of breath and wheezing. Negative for cough.   Genitourinary:  Negative for frequency.  Musculoskeletal:  Positive for arthralgias.  Neurological:  Negative for light-headedness.  Psychiatric/Behavioral:  Negative for self-injury and suicidal ideas.     Vital Signs: BP 125/70   Pulse 85   Temp 97.9 F (36.6 C)   Resp 16   Ht 6\' 4"  (1.93 m)   Wt 198 lb (89.8 kg)   SpO2 93%   BMI 24.10 kg/m    Physical Exam Vitals and nursing note reviewed.  Constitutional:      General: He is not in acute distress.    Appearance: He is well-developed and normal weight. He is not diaphoretic.  HENT:     Head: Normocephalic and atraumatic.     Mouth/Throat:     Pharynx: No oropharyngeal exudate.  Eyes:     Pupils: Pupils are equal, round, and reactive to light.  Neck:     Thyroid: No thyromegaly.     Vascular: No JVD.     Trachea: No tracheal deviation.  Cardiovascular:     Rate and Rhythm: Normal rate and regular rhythm.     Heart sounds: Normal heart sounds. No murmur heard.    No friction rub. No gallop.  Pulmonary:     Effort: Pulmonary effort is normal.     Breath sounds: No wheezing.  Abdominal:     General: Bowel sounds are normal.     Palpations: Abdomen is soft.  Musculoskeletal:        General: Normal range of motion.     Cervical back: Normal range of motion and neck supple.  Lymphadenopathy:     Cervical: No cervical adenopathy.  Skin:    General: Skin is warm and dry.  Neurological:     Mental Status: He is alert and oriented to person, place, and time.     Cranial Nerves: No cranial nerve deficit.     Gait: Gait abnormal.  Psychiatric:        Thought Content: Thought content normal.        Judgment: Judgment normal.        Assessment/Plan: 1. Pulmonary emphysema, unspecified emphysema type (HCC) (Primary) Discussed use of inhalers as prescribed, consider trelegy/breztri in future. Will establish with pulmonology  2.  Cigarette nicotine dependence with nicotine-induced disorder Discussed working on smoking cessation. Awaiting Ct chest screening results   General Counseling: Lenora Boys understanding of the findings of todays visit and agrees with plan of treatment. I have discussed any further diagnostic evaluation that may be needed or ordered today. We also reviewed his medications today. he has  been encouraged to call the office with any questions or concerns that should arise related to todays visit.    No orders of the defined types were placed in this encounter.   No orders of the defined types were placed in this encounter.   This patient was seen by Lynn Ito, PA-C in collaboration with Dr. Beverely Risen as a part of collaborative care agreement.   Total time spent:30 Minutes Time spent includes review of chart, medications, test results, and follow up plan with the patient.      Dr Lyndon Code Internal medicine

## 2023-01-09 ENCOUNTER — Telehealth: Payer: Self-pay

## 2023-01-09 NOTE — Telephone Encounter (Signed)
Left message for patient regarding CT results.

## 2023-01-09 NOTE — Telephone Encounter (Signed)
-----   Message from Carlean Jews sent at 01/09/2023  1:41 PM EST ----- Please let him know that his CT did not show any new suspicious lesions/nodules. Does continue to show COPD and aortic atherosclerosis as previously seen on past CT. Remind him to make sure he keeps pulmonary visit in Jan with DSK

## 2023-01-14 NOTE — Procedures (Signed)
[facility] [address] [place], [address]    Complete Pulmonary Function Testing Interpretation:  FINDINGS:  Forced vital capacity is severely decreased.  FEV1 is 1.36 L which is 31% of predicted and is severely decreased.  FEV1 FVC ratio is decreased postbronchodilator there is no significant change in FEV1.  Total lung capacity is increased residual volume is increased FRC is increased residual volume total lung capacity ratio is increased.  DLCO was not measurable  IMPRESSION:  This pulmonary function study is consistent with severe obstructive lung disease clinical correlation is recommended  Yevonne Pax, MD [facility] Pulmonary Critical Care Medicine Sleep Medicine

## 2023-01-15 ENCOUNTER — Telehealth: Payer: Self-pay

## 2023-01-15 ENCOUNTER — Ambulatory Visit: Admit: 2023-01-15 | Payer: Medicare HMO | Admitting: Ophthalmology

## 2023-01-15 SURGERY — CATARACT EXTRACTION PHACO AND INTRAOCULAR LENS PLACEMENT (IOC)
Anesthesia: Topical | Laterality: Left

## 2023-01-15 NOTE — Telephone Encounter (Signed)
Have tried to contact patient multiple times regarding CT results. He also does not have MyChart set up.

## 2023-01-15 NOTE — Telephone Encounter (Signed)
-----   Message from Carlean Jews sent at 01/09/2023  1:41 PM EST ----- Please let him know that his CT did not show any new suspicious lesions/nodules. Does continue to show COPD and aortic atherosclerosis as previously seen on past CT. Remind him to make sure he keeps pulmonary visit in Jan with DSK

## 2023-01-18 ENCOUNTER — Other Ambulatory Visit: Payer: Self-pay | Admitting: Physician Assistant

## 2023-01-18 DIAGNOSIS — I6340 Cerebral infarction due to embolism of unspecified cerebral artery: Secondary | ICD-10-CM

## 2023-01-23 ENCOUNTER — Other Ambulatory Visit: Payer: Self-pay | Admitting: Physician Assistant

## 2023-01-30 ENCOUNTER — Ambulatory Visit: Payer: Medicare HMO | Admitting: Internal Medicine

## 2023-01-30 ENCOUNTER — Ambulatory Visit (INDEPENDENT_AMBULATORY_CARE_PROVIDER_SITE_OTHER): Payer: Medicare HMO | Admitting: Internal Medicine

## 2023-01-30 ENCOUNTER — Encounter: Payer: Self-pay | Admitting: Internal Medicine

## 2023-01-30 VITALS — BP 110/70 | HR 80 | Temp 98.0°F | Resp 16 | Ht 76.0 in | Wt 199.0 lb

## 2023-01-30 DIAGNOSIS — F17209 Nicotine dependence, unspecified, with unspecified nicotine-induced disorders: Secondary | ICD-10-CM

## 2023-01-30 DIAGNOSIS — F17219 Nicotine dependence, cigarettes, with unspecified nicotine-induced disorders: Secondary | ICD-10-CM

## 2023-01-30 DIAGNOSIS — R5383 Other fatigue: Secondary | ICD-10-CM

## 2023-01-30 DIAGNOSIS — J439 Emphysema, unspecified: Secondary | ICD-10-CM | POA: Diagnosis not present

## 2023-01-30 MED ORDER — COMBIVENT RESPIMAT 20-100 MCG/ACT IN AERS: 1.0000 | INHALATION_SPRAY | Freq: Four times a day (QID) | RESPIRATORY_TRACT | 5 refills | Status: AC | PRN

## 2023-01-30 MED ORDER — BREZTRI AEROSPHERE 160-9-4.8 MCG/ACT IN AERO: 2.0000 | INHALATION_SPRAY | Freq: Two times a day (BID) | RESPIRATORY_TRACT | 11 refills | Status: AC

## 2023-01-30 NOTE — Patient Instructions (Signed)
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
## 2023-01-31 ENCOUNTER — Telehealth: Payer: Self-pay | Admitting: Internal Medicine

## 2023-01-31 NOTE — Telephone Encounter (Signed)
 SS order faxed to [facility]; [phone]

## 2023-02-09 ENCOUNTER — Telehealth: Payer: Self-pay | Admitting: Internal Medicine

## 2023-02-09 NOTE — Telephone Encounter (Signed)
SS appointment 02/28/23 @ [facility]

## 2023-02-12 ENCOUNTER — Ambulatory Visit: Payer: Medicare HMO | Admitting: Internal Medicine

## 2023-02-28 ENCOUNTER — Other Ambulatory Visit: Payer: Self-pay | Admitting: Internal Medicine

## 2023-02-28 ENCOUNTER — Encounter: Payer: Self-pay | Admitting: *Deleted

## 2023-02-28 ENCOUNTER — Other Ambulatory Visit: Payer: Self-pay | Admitting: Physician Assistant

## 2023-02-28 DIAGNOSIS — I6340 Cerebral infarction due to embolism of unspecified cerebral artery: Secondary | ICD-10-CM

## 2023-02-28 DIAGNOSIS — E782 Mixed hyperlipidemia: Secondary | ICD-10-CM

## 2023-02-28 DIAGNOSIS — I10 Essential (primary) hypertension: Secondary | ICD-10-CM

## 2023-03-01 ENCOUNTER — Telehealth: Payer: Self-pay | Admitting: Internal Medicine

## 2023-03-01 NOTE — Telephone Encounter (Signed)
 Per [facility], patient was no show 02/28/23-Gerald Martin

## 2023-03-05 ENCOUNTER — Ambulatory Visit (INDEPENDENT_AMBULATORY_CARE_PROVIDER_SITE_OTHER): Payer: Medicare HMO | Admitting: Physician Assistant

## 2023-03-05 ENCOUNTER — Encounter: Payer: Self-pay | Admitting: Physician Assistant

## 2023-03-05 VITALS — BP 125/85 | HR 84 | Temp 98.7°F | Resp 16 | Ht 76.0 in | Wt 195.0 lb

## 2023-03-05 DIAGNOSIS — F332 Major depressive disorder, recurrent severe without psychotic features: Secondary | ICD-10-CM | POA: Diagnosis not present

## 2023-03-05 DIAGNOSIS — J439 Emphysema, unspecified: Secondary | ICD-10-CM

## 2023-03-05 DIAGNOSIS — I10 Essential (primary) hypertension: Secondary | ICD-10-CM

## 2023-03-05 DIAGNOSIS — M545 Low back pain, unspecified: Secondary | ICD-10-CM

## 2023-03-05 DIAGNOSIS — G8929 Other chronic pain: Secondary | ICD-10-CM

## 2023-03-05 DIAGNOSIS — F17219 Nicotine dependence, cigarettes, with unspecified nicotine-induced disorders: Secondary | ICD-10-CM

## 2023-03-05 MED ORDER — TRAMADOL HCL 50 MG PO TABS: ORAL_TABLET | ORAL | 0 refills | Status: AC
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
## 2023-03-12 ENCOUNTER — Ambulatory Visit (INDEPENDENT_AMBULATORY_CARE_PROVIDER_SITE_OTHER): Payer: Medicare HMO | Admitting: Internal Medicine

## 2023-03-12 ENCOUNTER — Encounter: Payer: Self-pay | Admitting: Internal Medicine

## 2023-03-12 VITALS — BP 140/90 | HR 100 | Temp 98.4°F | Resp 16 | Ht 76.0 in | Wt 194.0 lb

## 2023-03-12 DIAGNOSIS — J439 Emphysema, unspecified: Secondary | ICD-10-CM

## 2023-03-12 DIAGNOSIS — R0602 Shortness of breath: Secondary | ICD-10-CM

## 2023-03-12 DIAGNOSIS — G4719 Other hypersomnia: Secondary | ICD-10-CM | POA: Diagnosis not present

## 2023-03-12 DIAGNOSIS — F17219 Nicotine dependence, cigarettes, with unspecified nicotine-induced disorders: Secondary | ICD-10-CM
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
## 2023-03-12 NOTE — Patient Instructions (Signed)

## 2023-06-15 ENCOUNTER — Other Ambulatory Visit: Payer: Self-pay | Admitting: Physician Assistant

## 2023-06-15 DIAGNOSIS — I161 Hypertensive emergency: Secondary | ICD-10-CM

## 2023-06-15 DIAGNOSIS — I6340 Cerebral infarction due to embolism of unspecified cerebral artery: Secondary | ICD-10-CM

## 2023-06-15 DIAGNOSIS — F332 Major depressive disorder, recurrent severe without psychotic features: Secondary | ICD-10-CM

## 2023-07-05 ENCOUNTER — Encounter: Payer: Self-pay | Admitting: Physician Assistant

## 2023-07-05 ENCOUNTER — Ambulatory Visit: Payer: Medicare HMO | Admitting: Physician Assistant

## 2023-07-05 VITALS — BP 112/86 | HR 72 | Temp 98.0°F | Resp 16 | Ht 76.0 in | Wt 198.0 lb

## 2023-07-05 DIAGNOSIS — F332 Major depressive disorder, recurrent severe without psychotic features: Secondary | ICD-10-CM

## 2023-07-05 DIAGNOSIS — J439 Emphysema, unspecified: Secondary | ICD-10-CM

## 2023-07-05 DIAGNOSIS — F17219 Nicotine dependence, cigarettes, with unspecified nicotine-induced disorders: Secondary | ICD-10-CM

## 2023-07-05 DIAGNOSIS — I10 Essential (primary) hypertension: Secondary | ICD-10-CM | POA: Diagnosis not present

## 2023-07-05 NOTE — Progress Notes (Signed)
 [facility] [address] [place], [place] [address]  Internal MEDICINE  Office Visit Note  Patient Name: Gerald Martin  [id number]  [id number]  Date of Service: 07/05/2023  No chief complaint on file.   HPI Pt is here for routine follow up -smoking 1ppd now,  no desire to quit at this time. Encouraged to do so. Is seeing pulmonology now -breathing ok, using inhaler -did not have SS, states he can't do that right now. Still fatigued -BP not checked at home -main concern is needing cataract surgery, pt states he wasn't able to move forward with this  Current Medication: Outpatient Encounter Medications as of 07/05/2023  Medication Sig   amLODipine  (NORVASC ) 10 MG tablet TAKE 1 TABLET BY MOUTH ONCE DAILY   ASPIRIN  LOW DOSE 81 MG EC tablet TAKE 1 TABLET BY MOUTH ONCE DAILY   atorvastatin  (LIPITOR) 40 MG tablet TAKE 1 TABLET BY MOUTH ONCE DAILY AT 6:00PM   Budeson-Glycopyrrol-Formoterol (BREZTRI  AEROSPHERE) 160-9-4.8 MCG/ACT AERO Inhale 2 puffs into the lungs 2 (two) times daily.   buPROPion  (WELLBUTRIN  XL) 300 MG 24 hr tablet TAKE 1 TABLET BY MOUTH ONCE DAILY   carvedilol  (COREG ) 6.25 MG tablet TAKE 1 TABLET BY MOUTH TWICE DAILY WITH A MEAL   clopidogrel  (PLAVIX ) 75 MG tablet TAKE 1 TABLET BY MOUTH ONCE DAILY   COMBIVENT  RESPIMAT 20-100 MCG/ACT AERS respimat Inhale 1 puff into the lungs every 6 (six) hours as needed for wheezing.   FLUoxetine  (PROZAC ) 40 MG capsule TAKE 1 CAPSULE BY MOUTH ONCE DAILY   INCRUSE ELLIPTA  62.5 MCG/ACT AEPB INHALE 1 PUFF INTO THE LUNGS ONCE DAILY.*RINSE MOUTH AFTER USE AS DIRECTED   lisinopril  (ZESTRIL ) 40 MG tablet TAKE 1 TABLET BY MOUTH ONCE DAILY   meclizine  (ANTIVERT ) 25 MG tablet Take 1 tablet (25 mg total) by mouth 3 (three) times daily as needed for dizziness.   omeprazole  (PRILOSEC) 20 MG capsule TAKE 1 CAPSULE BY MOUTH ONCE DAILY   traMADol  (ULTRAM ) 50 MG tablet TAKE 1 TABLET BY MOUTH DAILY AS NEEDED FOR PAIN   traZODone   (DESYREL ) 50 MG tablet TAKE 1 TABLET BY MOUTH AT BEDTIME   No facility-administered encounter medications on file as of 07/05/2023.    Surgical History: Past Surgical History:  Procedure Laterality Date   arm surgery[Other] Left    chain saw injury   chronic pain     LEG SURGERY Right     Medical History: Past Medical History:  Diagnosis Date   Anxiety    Chronic pain    back   COPD (chronic obstructive pulmonary disease) (HCC)    Depression    GERD (gastroesophageal reflux disease)    Hyperlipidemia    Hypertension    Stroke [facility])     Family History: Family History  Problem Relation Age of Onset   Stroke Mother    Diabetes Father     Social History   Socioeconomic History   Marital status: Single    Spouse name: Not on file   Number of children: Not on file   Years of education: Not on file   Highest education level: Not on file  Occupational History   Not on file  Tobacco Use   Smoking status: Every Day    Current packs/day: 2.00    Average packs/day: 2.0 packs/day for 30.0 years (60.0 ttl pk-yrs)    Types: Cigarettes   Smokeless tobacco: Never   Tobacco comments:    1/2PPD  Vaping Use   Vaping status:  Never Used  Substance and Sexual Activity   Alcohol use: Not Currently   Drug use: Never   Sexual activity: Not on file  Other Topics Concern   Not on file  Social History Narrative   ** Merged History Encounter **       Social Drivers of Health   Financial Resource Strain: Not on file  Food Insecurity: Not on file  Transportation Needs: Not on file  Physical Activity: Not on file  Stress: Not on file  Social Connections: Not on file  Intimate Partner Violence: Not on file      Review of Systems  Constitutional:  Positive for fatigue. Negative for chills and unexpected weight change.  HENT:  Negative for congestion, postnasal drip, rhinorrhea, sneezing and sore throat.   Eyes:  Positive for visual disturbance. Negative for redness.   Respiratory:  Negative for cough, chest tightness and shortness of breath.   Cardiovascular:  Negative for chest pain and palpitations.  Gastrointestinal:  Negative for abdominal pain, constipation, diarrhea, nausea and vomiting.  Genitourinary:  Negative for dysuria and frequency.  Musculoskeletal:  Positive for arthralgias, back pain and gait problem. Negative for joint swelling and neck pain.  Skin:  Negative for rash.  Neurological:  Negative for tremors and numbness.  Hematological:  Negative for adenopathy. Does not bruise/bleed easily.  Psychiatric/Behavioral:  Positive for dysphoric mood and sleep disturbance. Negative for self-injury and suicidal ideas. Behavioral problem: Depression.The patient is not nervous/anxious.     Vital Signs: BP 112/86 Comment: 140/90  Pulse 72   Temp 98 F (36.7 C)   Resp 16   Ht 6' 4 (1.93 m)   Wt 198 lb (89.8 kg)   SpO2 94%   BMI 24.10 kg/m    Physical Exam Vitals and nursing note reviewed.  Constitutional:      General: He is not in acute distress.    Appearance: He is well-developed and normal weight. He is not diaphoretic.  HENT:     Head: Normocephalic and atraumatic.     Mouth/Throat:     Pharynx: No oropharyngeal exudate.   Eyes:     Pupils: Pupils are equal, round, and reactive to light.   Neck:     Thyroid : No thyromegaly.     Vascular: No JVD.     Trachea: No tracheal deviation.   Cardiovascular:     Rate and Rhythm: Normal rate and regular rhythm.     Heart sounds: Normal heart sounds. No murmur heard.    No friction rub. No gallop.  Pulmonary:     Effort: Pulmonary effort is normal.     Breath sounds: No wheezing.  Abdominal:     General: Bowel sounds are normal.   Musculoskeletal:        General: Normal range of motion.   Skin:    General: Skin is warm and dry.   Neurological:     Mental Status: He is alert and oriented to person, place, and time.     Cranial Nerves: No cranial nerve deficit.     Gait:  Gait abnormal.   Psychiatric:        Thought Content: Thought content normal.        Judgment: Judgment normal.        Assessment/Plan: 1. Essential hypertension (Primary) Stable, continue current medications  2. Severe episode of recurrent major depressive disorder, without psychotic features (HCC) Continue current medications  3. Pulmonary emphysema, unspecified emphysema type (HCC) Followed by pulmonology and advised  to continue inhalers as prescribed  4. Cigarette nicotine  dependence with nicotine -induced disorder Encouraged to quit smoking   General Counseling: [place] verbalizes understanding of the findings of todays visit and agrees with plan of treatment. I have discussed any further diagnostic evaluation that may be needed or ordered today. We also reviewed his medications today. he has been encouraged to call the office with any questions or concerns that should arise related to todays visit.    No orders of the defined types were placed in this encounter.   No orders of the defined types were placed in this encounter.   This patient was seen by Tinnie Pro, PA-C in collaboration with Dr. Sigrid Bathe as a part of collaborative care agreement.   Total time spent:30 Minutes Time spent includes review of chart, medications, test results, and follow up plan with the patient.      Dr Fozia M Khan Internal medicine

## 2023-08-29 ENCOUNTER — Telehealth: Payer: Self-pay | Admitting: Physician Assistant

## 2023-08-29 NOTE — Telephone Encounter (Signed)
 Received fall risk notification from [facility] Gave to [employer]

## 2023-09-03 ENCOUNTER — Telehealth: Payer: Self-pay

## 2023-09-03 ENCOUNTER — Other Ambulatory Visit: Payer: Self-pay | Admitting: Physician Assistant

## 2023-09-03 DIAGNOSIS — W57XXXA Bitten or stung by nonvenomous insect and other nonvenomous arthropods, initial encounter: Secondary | ICD-10-CM

## 2023-09-03 MED ORDER — HYDROCORTISONE 0.5 % EX CREA: 1.0000 | TOPICAL_CREAM | Freq: Two times a day (BID) | CUTANEOUS | 0 refills | Status: AC

## 2023-09-03 NOTE — Telephone Encounter (Signed)
 Pt  caregiver notified that we sent medication

## 2023-09-10 ENCOUNTER — Ambulatory Visit: Payer: Medicare HMO | Admitting: Nurse Practitioner

## 2023-09-17 ENCOUNTER — Ambulatory Visit: Admitting: Internal Medicine

## 2023-09-17 ENCOUNTER — Telehealth: Payer: Self-pay | Admitting: Physician Assistant

## 2023-09-17 NOTE — Telephone Encounter (Signed)
 fall risk notification signed. Faxed back to [facility]; [phone]. Scanned-Toni

## 2023-10-12 ENCOUNTER — Other Ambulatory Visit: Payer: Self-pay | Admitting: Internal Medicine

## 2023-10-12 ENCOUNTER — Other Ambulatory Visit: Payer: Self-pay | Admitting: Physician Assistant

## 2023-10-12 DIAGNOSIS — E782 Mixed hyperlipidemia: Secondary | ICD-10-CM

## 2023-10-12 DIAGNOSIS — I6340 Cerebral infarction due to embolism of unspecified cerebral artery: Secondary | ICD-10-CM

## 2023-10-12 DIAGNOSIS — I10 Essential (primary) hypertension: Secondary | ICD-10-CM

## 2023-11-27 ENCOUNTER — Telehealth: Payer: Self-pay | Admitting: Internal Medicine

## 2023-11-27 NOTE — Telephone Encounter (Signed)
 Notified patient's aide of CT appointment date, arrival time, location -Gerald Martin

## 2023-11-30 ENCOUNTER — Observation Stay
Admission: EM | Admit: 2023-11-30 | Discharge: 2023-12-03 | Disposition: A | Attending: Emergency Medicine | Admitting: Emergency Medicine

## 2023-11-30 ENCOUNTER — Observation Stay

## 2023-11-30 ENCOUNTER — Emergency Department

## 2023-11-30 ENCOUNTER — Other Ambulatory Visit: Payer: Self-pay

## 2023-11-30 DIAGNOSIS — Z1152 Encounter for screening for COVID-19: Secondary | ICD-10-CM | POA: Diagnosis not present

## 2023-11-30 DIAGNOSIS — Z7982 Long term (current) use of aspirin: Secondary | ICD-10-CM | POA: Insufficient documentation

## 2023-11-30 DIAGNOSIS — F1090 Alcohol use, unspecified, uncomplicated: Secondary | ICD-10-CM | POA: Diagnosis not present

## 2023-11-30 DIAGNOSIS — I639 Cerebral infarction, unspecified: Secondary | ICD-10-CM | POA: Diagnosis present

## 2023-11-30 DIAGNOSIS — R2681 Unsteadiness on feet: Secondary | ICD-10-CM | POA: Diagnosis not present

## 2023-11-30 DIAGNOSIS — I10 Essential (primary) hypertension: Secondary | ICD-10-CM | POA: Insufficient documentation

## 2023-11-30 DIAGNOSIS — Z8673 Personal history of transient ischemic attack (TIA), and cerebral infarction without residual deficits: Secondary | ICD-10-CM | POA: Diagnosis not present

## 2023-11-30 DIAGNOSIS — S0003XA Contusion of scalp, initial encounter: Secondary | ICD-10-CM

## 2023-11-30 DIAGNOSIS — N179 Acute kidney failure, unspecified: Secondary | ICD-10-CM | POA: Insufficient documentation

## 2023-11-30 DIAGNOSIS — R55 Syncope and collapse: Principal | ICD-10-CM | POA: Diagnosis present

## 2023-11-30 DIAGNOSIS — Z79899 Other long term (current) drug therapy: Secondary | ICD-10-CM | POA: Insufficient documentation

## 2023-11-30 DIAGNOSIS — Z122 Encounter for screening for malignant neoplasm of respiratory organs: Secondary | ICD-10-CM | POA: Insufficient documentation

## 2023-11-30 DIAGNOSIS — I951 Orthostatic hypotension: Principal | ICD-10-CM | POA: Insufficient documentation

## 2023-11-30 DIAGNOSIS — W01198A Fall on same level from slipping, tripping and stumbling with subsequent striking against other object, initial encounter: Secondary | ICD-10-CM | POA: Diagnosis not present

## 2023-11-30 DIAGNOSIS — F17210 Nicotine dependence, cigarettes, uncomplicated: Secondary | ICD-10-CM | POA: Insufficient documentation

## 2023-11-30 DIAGNOSIS — Z7901 Long term (current) use of anticoagulants: Secondary | ICD-10-CM | POA: Insufficient documentation

## 2023-11-30 DIAGNOSIS — R2689 Other abnormalities of gait and mobility: Secondary | ICD-10-CM | POA: Insufficient documentation

## 2023-11-30 DIAGNOSIS — R0902 Hypoxemia: Secondary | ICD-10-CM | POA: Diagnosis not present

## 2023-11-30 DIAGNOSIS — F32A Depression, unspecified: Secondary | ICD-10-CM | POA: Diagnosis not present

## 2023-11-30 DIAGNOSIS — Z9181 History of falling: Secondary | ICD-10-CM | POA: Insufficient documentation

## 2023-11-30 DIAGNOSIS — I959 Hypotension, unspecified: Secondary | ICD-10-CM | POA: Insufficient documentation

## 2023-11-30 LAB — URINALYSIS, ROUTINE W REFLEX MICROSCOPIC
Bilirubin Urine: NEGATIVE
Glucose, UA: NEGATIVE mg/dL
Hgb urine dipstick: NEGATIVE
Ketones, ur: NEGATIVE mg/dL
Leukocytes,Ua: NEGATIVE
Nitrite: NEGATIVE
Protein, ur: NEGATIVE mg/dL
Specific Gravity, Urine: 1.008 (ref 1.005–1.030)
pH: 6 (ref 5.0–8.0)

## 2023-11-30 LAB — RESP PANEL BY RT-PCR (RSV, FLU A&B, COVID)  RVPGX2
Influenza A by PCR: NEGATIVE
Influenza B by PCR: NEGATIVE
Resp Syncytial Virus by PCR: NEGATIVE
SARS Coronavirus 2 by RT PCR: NEGATIVE

## 2023-11-30 LAB — CBC
HCT: 44.8 % (ref 39.0–52.0)
Hemoglobin: 14.7 g/dL (ref 13.0–17.0)
MCH: 28.5 pg (ref 26.0–34.0)
MCHC: 32.8 g/dL (ref 30.0–36.0)
MCV: 87 fL (ref 80.0–100.0)
Platelets: 384 K/uL (ref 150–400)
RBC: 5.15 MIL/uL (ref 4.22–5.81)
RDW: 13.8 % (ref 11.5–15.5)
WBC: 9 K/uL (ref 4.0–10.5)
nRBC: 0 % (ref 0.0–0.2)

## 2023-11-30 LAB — URINE DRUG SCREEN, QUALITATIVE (ARMC ONLY)
Amphetamines, Ur Screen: NOT DETECTED
Barbiturates, Ur Screen: NOT DETECTED
Benzodiazepine, Ur Scrn: NOT DETECTED
Cannabinoid 50 Ng, Ur ~~LOC~~: NOT DETECTED
Cocaine Metabolite,Ur ~~LOC~~: NOT DETECTED
MDMA (Ecstasy)Ur Screen: NOT DETECTED
Methadone Scn, Ur: NOT DETECTED
Opiate, Ur Screen: NOT DETECTED
Phencyclidine (PCP) Ur S: NOT DETECTED
Tricyclic, Ur Screen: NOT DETECTED

## 2023-11-30 LAB — BASIC METABOLIC PANEL WITH GFR
Anion gap: 13 (ref 5–15)
BUN: 13 mg/dL (ref 6–20)
CO2: 22 mmol/L (ref 22–32)
Calcium: 8.5 mg/dL — ABNORMAL LOW (ref 8.9–10.3)
Chloride: 96 mmol/L — ABNORMAL LOW (ref 98–111)
Creatinine, Ser: 1.65 mg/dL — ABNORMAL HIGH (ref 0.61–1.24)
GFR, Estimated: 48 mL/min — ABNORMAL LOW (ref >60)
Glucose, Bld: 136 mg/dL — ABNORMAL HIGH (ref 70–99)
Potassium: 4 mmol/L (ref 3.5–5.1)
Sodium: 131 mmol/L — ABNORMAL LOW (ref 135–145)

## 2023-11-30 LAB — TROPONIN I (HIGH SENSITIVITY)
Troponin I (High Sensitivity): 15 ng/L (ref <18)
Troponin I (High Sensitivity): 11 ng/L (ref <18)

## 2023-11-30 LAB — D-DIMER, QUANTITATIVE: D-Dimer, Quant: 1.72 ug{FEU}/mL — ABNORMAL HIGH (ref 0.00–0.50)

## 2023-11-30 LAB — ETHANOL: Alcohol, Ethyl (B): <15 mg/dL (ref <15)

## 2023-11-30 LAB — CK: Total CK: 175 U/L (ref 49–397)

## 2023-11-30 MED ORDER — ENOXAPARIN SODIUM 40 MG/0.4ML IJ SOSY
40.0000 mg | PREFILLED_SYRINGE | INTRAMUSCULAR | Status: DC
  Administered 2023-12-01 – 2023-12-03 (×3): 40 mg via SUBCUTANEOUS
  Filled 2023-11-30 (×3): qty 0.4

## 2023-11-30 MED ORDER — ONDANSETRON HCL 4 MG PO TABS: 4.0000 mg | ORAL_TABLET | Freq: Four times a day (QID) | ORAL | Status: DC | PRN

## 2023-11-30 MED ORDER — ATORVASTATIN CALCIUM 20 MG PO TABS
40.0000 mg | ORAL_TABLET | Freq: Every day | ORAL | Status: DC
  Administered 2023-12-01 – 2023-12-03 (×3): 40 mg via ORAL
  Filled 2023-11-30 (×3): qty 2

## 2023-11-30 MED ORDER — SODIUM CHLORIDE 0.9% FLUSH
3.0000 mL | Freq: Two times a day (BID) | INTRAVENOUS | Status: DC
  Administered 2023-12-01 – 2023-12-03 (×5): 3 mL via INTRAVENOUS

## 2023-11-30 MED ORDER — SODIUM CHLORIDE 0.9 % IV BOLUS
1000.0000 mL | Freq: Once | INTRAVENOUS | Status: AC
  Administered 2023-11-30: 1000 mL via INTRAVENOUS

## 2023-11-30 MED ORDER — ACETAMINOPHEN 325 MG PO TABS: 650.0000 mg | ORAL_TABLET | Freq: Four times a day (QID) | ORAL | Status: DC | PRN

## 2023-11-30 MED ORDER — HYDROCODONE-ACETAMINOPHEN 5-325 MG PO TABS
1.0000 | ORAL_TABLET | ORAL | Status: DC | PRN
  Administered 2023-12-01 (×3): 2 via ORAL
  Administered 2023-12-02: 1 via ORAL
  Administered 2023-12-02 (×2): 2 via ORAL
  Filled 2023-11-30 (×6): qty 2

## 2023-11-30 MED ORDER — SODIUM CHLORIDE 0.9 % IV SOLN: INTRAVENOUS | Status: AC

## 2023-11-30 MED ORDER — FLUOXETINE HCL 20 MG PO CAPS
40.0000 mg | ORAL_CAPSULE | Freq: Every day | ORAL | Status: DC
  Administered 2023-12-01 – 2023-12-03 (×3): 40 mg via ORAL
  Filled 2023-11-30 (×3): qty 2

## 2023-11-30 MED ORDER — ONDANSETRON HCL 4 MG/2ML IJ SOLN: 4.0000 mg | Freq: Four times a day (QID) | INTRAMUSCULAR | Status: DC | PRN

## 2023-11-30 MED ORDER — TRAZODONE HCL 50 MG PO TABS
50.0000 mg | ORAL_TABLET | Freq: Every day | ORAL | Status: DC
  Administered 2023-12-01 – 2023-12-02 (×3): 50 mg via ORAL
  Filled 2023-11-30 (×3): qty 1

## 2023-11-30 MED ORDER — IOHEXOL 350 MG/ML SOLN
75.0000 mL | Freq: Once | INTRAVENOUS | Status: AC | PRN
  Administered 2023-12-01: 75 mL via INTRAVENOUS

## 2023-11-30 MED ORDER — ACETAMINOPHEN 650 MG RE SUPP: 650.0000 mg | Freq: Four times a day (QID) | RECTAL | Status: DC | PRN

## 2023-11-30 MED ORDER — ASPIRIN 81 MG PO TBEC
81.0000 mg | DELAYED_RELEASE_TABLET | Freq: Every day | ORAL | Status: DC
  Administered 2023-12-01 – 2023-12-03 (×3): 81 mg via ORAL
  Filled 2023-11-30 (×3): qty 1

## 2023-11-30 MED ORDER — BUPROPION HCL ER (XL) 150 MG PO TB24
300.0000 mg | ORAL_TABLET | Freq: Every day | ORAL | Status: DC
  Administered 2023-12-01 – 2023-12-03 (×3): 300 mg via ORAL
  Filled 2023-11-30 (×3): qty 2

## 2023-11-30 NOTE — ED Triage Notes (Signed)
 See first nurse note, pt repeatedly stating I just dont feel right, I need to lay down. +LOC. Pt with pale skin color.  Pt appears unwell.  +ETOH

## 2023-11-30 NOTE — Assessment & Plan Note (Addendum)
 Hypotension, orthostatic Positive orthostatic vitals, seems dehydrated. No stigmata of infection, low suspicion for ACS, toxicology negative -Carotid Doppler-->No hemodynamically significant stenosis -MRI brain-->Patient declines -CTA chest--> negative for PE

## 2023-11-30 NOTE — ED Provider Notes (Addendum)
 [facility] Provider Note    Event Date/Time   First MD Initiated Contact with Patient 11/30/23 1648     (approximate)   History   Loss of Consciousness   HPI  Gerald Martin is a 58 y.o. male with history of hypertension, hyperlipidemia, COPD, stroke, and chronic back pain who presents with syncope and generalized weakness.  The patient states that he has been feeling weak for about the last 1 to 2 weeks with no focal weakness or numbness.  Today he was in the bank, got lightheaded, and then passed out, hitting the back of his head.  He denies any nausea or vomiting.  He has no chest pain or difficulty breathing.  He denies any fever or chills.  He has a mild cough.  He has no urinary symptoms.  The patient states that he drank a quart of alcohol today, but states he is not a daily drinker.  Reviewed the past medical records.  The patient's most recent outpatient encounter in our system was on 6/12 with internal medicine for follow-up of his chronic conditions.   Physical Exam   Triage Vital Signs: ED Triage Vitals  Encounter Vitals Group     BP 11/30/23 1554 (!) 85/70     Girls Systolic BP Percentile --      Girls Diastolic BP Percentile --      Boys Systolic BP Percentile --      Boys Diastolic BP Percentile --      Pulse Rate 11/30/23 1554 71     Resp 11/30/23 1554 14     Temp 11/30/23 1554 97.6 F (36.4 C)     Temp src --      SpO2 11/30/23 1554 95 %     Weight 11/30/23 1555 200 lb (90.7 kg)     Height 11/30/23 1555 6' 4 (1.93 m)     Head Circumference --      Peak Flow --      Pain Score 11/30/23 1555 0     Pain Loc --      Pain Education --      Exclude from Growth Chart --     Most recent vital signs: Vitals:   11/30/23 1930 11/30/23 2053  BP: (!) 165/116   Pulse: 80   Resp: 18   Temp:  98.2 F (36.8 C)  SpO2: 91%      General: And oriented, somewhat weak appearing, no distress.  CV:  Good peripheral perfusion.   Resp:  Normal effort.  Lungs CTAB. Abd:  No distention. Other:  EOMI.  PERRLA.  No photophobia.  No facial droop.  Normal speech.  Motor intact in all extremities.  Somewhat dry mucous membranes.  Posterior scalp with an approximately 2 to 3 cm palpable soft tissue defect but no open wound.   ED Results / Procedures / Treatments   Labs (all labs ordered are listed, but only abnormal results are displayed) Labs Reviewed  BASIC METABOLIC PANEL WITH GFR - Abnormal; Notable for the following components:      Result Value   Sodium 131 (*)    Chloride 96 (*)    Glucose, Bld 136 (*)    Creatinine, Ser 1.65 (*)    Calcium  8.5 (*)    GFR, Estimated 48 (*)    All other components within normal limits  URINALYSIS, ROUTINE W REFLEX MICROSCOPIC - Abnormal; Notable for the following components:   Color, Urine YELLOW (*)  APPearance CLEAR (*)    All other components within normal limits  RESP PANEL BY RT-PCR (RSV, FLU A&B, COVID)  RVPGX2  CBC  ETHANOL  CK  URINE DRUG SCREEN, QUALITATIVE ([facility] ONLY)  D-DIMER, QUANTITATIVE  TROPONIN I (HIGH SENSITIVITY)  TROPONIN I (HIGH SENSITIVITY)     EKG  ED ECG REPORT I, Waylon Cassis, the attending physician, personally viewed and interpreted this ECG.  Date: 11/30/2023 EKG Time: 1713 Rate: 66 Rhythm: normal sinus rhythm QRS Axis: normal Intervals: normal ST/T Wave abnormalities: Nonspecific ST abnormalities, lateral leads Narrative Interpretation: no evidence of acute ischemia; similar appearance to EKG from 05/19/2015    RADIOLOGY  CT head: I dependently viewed and interpreted the images; there is no ICH  CT cervical spine: No acute fracture  PROCEDURES:  Critical Care performed: No  Procedures   MEDICATIONS ORDERED IN ED: Medications  sodium chloride  0.9 % bolus 1,000 mL (0 mLs Intravenous Stopped 11/30/23 1710)  sodium chloride  0.9 % bolus 1,000 mL (0 mLs Intravenous Stopped 11/30/23 1940)     IMPRESSION / MDM /  ASSESSMENT AND PLAN / ED COURSE  I reviewed the triage vital signs and the nursing notes.  58 year old male with PMH as noted above presents after syncopal event while waiting at the bank, causing him to hit his head.  He reports generalized weakness for approximately the last week.  Neurologic exam is nonfocal.  He has a palpable contusion or divot to the posterior scalp with an overlying abrasion which was bleeding, however there is no laceration noted that requires repair.  Differential diagnosis includes, but is not limited to, dehydration, electrolyte abnormality, hypoglycemia, other metabolic cause, rhabdomyolysis, cardiac dysrhythmia, UTI or other infection.  CT head and cervical spine are negative for acute findings.  BMP shows mild AKI.  CBC is normal.  I have added on cardiac enzymes, urinalysis, CK, respiratory panel, and a UDS.  Patient's presentation is most consistent with acute complicated illness / injury requiring diagnostic workup.  The patient is on the cardiac monitor to evaluate for evidence of arrhythmia and/or significant heart rate changes.  ----------------------------------------- 10:24 PM on 11/30/2023 -----------------------------------------  Troponins are negative.  CK is not elevated.  Urinalysis shows no evidence of UTI.  Respiratory panel is also negative.  The patient reported feeling somewhat better although still tired.  His blood pressure remained stable when he was lying flat, however we checked orthostatics and he once again is hypotensive with standing, with a blood pressure of 80s/60s.  With persistent orthostatic hypotension after 2 L of fluid, I feel that he will benefit from inpatient admission for further management.  I consulted Dr. Cleatus from the hospitalist service; based on our discussion she agrees to evaluate the patient for admission.   FINAL CLINICAL IMPRESSION(S) / ED DIAGNOSES   Final diagnoses:  Syncope, unspecified syncope type   Orthostatic hypotension     Rx / DC Orders   ED Discharge Orders     None        Note:  This document was prepared using Dragon voice recognition software and may include unintentional dictation errors.    Cassis Waylon, MD 11/30/23 2225    Cassis Waylon, MD 11/30/23 2226

## 2023-11-30 NOTE — ED Notes (Signed)
 Pt breathing shallow. Oxygen saturation dropping. Encouraged to cough and blow nose. Placed on 2L [place].

## 2023-11-30 NOTE — Assessment & Plan Note (Addendum)
 Nonfocal exam MRI brain with an acute lacunar infarct involving putamen -Continue aspirin , Plavix  and statin

## 2023-11-30 NOTE — Hospital Course (Signed)
 Gerald Martin

## 2023-11-30 NOTE — ED Notes (Signed)
 Obtained orthostatic vitals on pt, pt stated I don't feel too well when standing for the standing portion of the vitals. Pt is lying back down in the bed with call bell by bedside. Pt states he feels a little better when lying down.

## 2023-11-30 NOTE — Assessment & Plan Note (Addendum)
 Creatinine 1.72, up from baseline of 1.10 about a year ago Likely ATN related to renal hypoperfusion from hypotension Creatinine improved to 1.27 with IV fluid today. - Giving some more IV fluid -Monitor renal function -Avoid nephrotoxins

## 2023-11-30 NOTE — Assessment & Plan Note (Addendum)
 Continue bupropion  and fluoxetine  and trazodone 

## 2023-11-30 NOTE — ED Triage Notes (Signed)
 First nurse note: Pt to ED via [facility] from [facility]. Staff reports walked up to the counter and passed out. Pt was alert up[on Ems arrival. Lac on back of head. Nurse reported pt is suppose to be on blood thinners but pt has been non-compliant. 18g LFA   116/80 75 HR  93 CBG  17 RR  CO2 28 99% RA

## 2023-11-30 NOTE — Assessment & Plan Note (Signed)
 Secondary to fall/syncope Pain control Ice packs

## 2023-11-30 NOTE — Assessment & Plan Note (Addendum)
 Blood pressure now elevated.  Initially home antihypertensives were held due to softer blood pressure. - Restarting home carvedilol  and amlodipine  -Continue holding lisinopril 

## 2023-11-30 NOTE — H&P (Addendum)
 History and Physical    Patient: Gerald Martin FMW:[id number] DOB: 1965/09/09 DOA: 11/30/2023 DOS: the patient was seen and examined on 11/30/2023 PCP: Kristina Tinnie POUR, PA-C  Patient coming from: Home  Chief Complaint:  Chief Complaint  Patient presents with   Loss of Consciousness   HPI: Gerald Martin is a 58 y.o. male with medical history significant of HTN, depression, prior CVA, chronic back pain, being admitted for syncope workup.  Patient was at the bank when he started feeling unwell and then passed out falling, hitting his head.  He was awake by arrival of EMS.  He continues to feel weak.  States he has not been feeling himself for the past couple days.  States he had a single episode of vomiting which he is unable to describe.  Denies diarrhea, cough, fever or chills.  Has no chest pain, shortness of breath, palpitations.  With EMS BP was in the 80s over 70s. On arrival BP 84/65, improving with fluid boluses to the 140s over 90s.  Heart rate in the 60s to 70s, O2 sat occasionally down to 88% for which he was placed on O2. Labs: CBC WNL, BMP notable for creatinine of 1.65 with no recent baseline, sodium 131 EtOH less than 15 and UDS clean CK1 75 UA sterile and respiratory viral panel negative Troponin 11 EKG showing sinus at 66 CT head and C-spine nonacute and chest x-ray showing mild cardiomegaly Patient was treated with a 2 L NS bolus Admission requested for syncope workup   Past Medical History:  Diagnosis Date   Anxiety    Chronic pain    back   COPD (chronic obstructive pulmonary disease) (HCC)    Depression    GERD (gastroesophageal reflux disease)    Hyperlipidemia    Hypertension    Stroke [facility])    Past Surgical History:  Procedure Laterality Date   arm surgery[Other] Left    chain saw injury   chronic pain     LEG SURGERY Right    Social History:  reports that he has been smoking cigarettes. He has a 60 pack-year smoking history. He has never  used smokeless tobacco. He reports current alcohol use. He reports that he does not use drugs.  Allergies  Allergen Reactions   Penicillins Other (See Comments)    Family History  Problem Relation Age of Onset   Stroke Mother    Diabetes Father     Prior to Admission medications   Medication Sig Start Date End Date Taking? Authorizing Provider  amLODipine  (NORVASC ) 10 MG tablet TAKE 1 TABLET BY MOUTH ONCE DAILY 06/19/23  Yes McDonough, Lauren K, PA-C  ASPIRIN  EC ADULT LOW DOSE 81 MG tablet TAKE 1 TABLET BY MOUTH ONCE DAILY 10/15/23  Yes McDonough, Lauren K, PA-C  atorvastatin  (LIPITOR) 40 MG tablet TAKE 1 TABLET BY MOUTH ONCE DAILY AT 6:00PM 10/15/23  Yes McDonough, Lauren K, PA-C  Budeson-Glycopyrrol-Formoterol (BREZTRI  AEROSPHERE) 160-9-4.8 MCG/ACT AERO Inhale 2 puffs into the lungs 2 (two) times daily. 01/30/23  Yes Fernand Elfreda LABOR, MD  buPROPion  (WELLBUTRIN  XL) 300 MG 24 hr tablet TAKE 1 TABLET BY MOUTH ONCE DAILY 10/15/23  Yes McDonough, Lauren K, PA-C  carvedilol  (COREG ) 6.25 MG tablet TAKE 1 TABLET BY MOUTH TWICE DAILY WITH A MEAL 10/15/23  Yes McDonough, Lauren K, PA-C  clopidogrel  (PLAVIX ) 75 MG tablet TAKE 1 TABLET BY MOUTH ONCE DAILY 06/19/23  Yes McDonough, Lauren K, PA-C  COMBIVENT  RESPIMAT 20-100 MCG/ACT AERS respimat Inhale 1 puff  into the lungs every 6 (six) hours as needed for wheezing. 01/30/23  Yes Fernand Rima A, MD  FLUoxetine  (PROZAC ) 40 MG capsule TAKE 1 CAPSULE BY MOUTH ONCE DAILY 06/19/23  Yes McDonough, Lauren K, PA-C  INCRUSE ELLIPTA  62.5 MCG/ACT AEPB INHALE 1 PUFF INTO THE LUNGS ONCE DAILY.*RINSE MOUTH AFTER USE AS DIRECTED 01/25/23  Yes McDonough, Lauren K, PA-C  lisinopril  (ZESTRIL ) 40 MG tablet TAKE 1 TABLET BY MOUTH ONCE DAILY 06/19/23  Yes McDonough, Lauren K, PA-C  omeprazole  (PRILOSEC) 20 MG capsule TAKE 1 CAPSULE BY MOUTH ONCE DAILY 06/19/23  Yes McDonough, Lauren K, PA-C  traMADol  (ULTRAM ) 50 MG tablet TAKE 1 TABLET BY MOUTH DAILY AS NEEDED FOR PAIN 03/05/23  Yes  McDonough, Lauren K, PA-C  traZODone  (DESYREL ) 50 MG tablet TAKE 1 TABLET BY MOUTH AT BEDTIME 06/19/23  Yes McDonough, Lauren K, PA-C  hydrocortisone  cream 0.5 % Apply 1 Application topically 2 (two) times daily. 09/03/23   McDonough, Tinnie POUR, PA-C  meclizine  (ANTIVERT ) 25 MG tablet Take 1 tablet (25 mg total) by mouth 3 (three) times daily as needed for dizziness. 06/07/18   Hanford Powell BRAVO, NP    Physical Exam: Vitals:   11/30/23 2053 11/30/23 2100 11/30/23 2130 11/30/23 2335  BP:  (!) 149/108 (!) 164/121 (!) 133/99  Pulse:  71 79 76  Resp:  (!) 0 18 16  Temp: 98.2 F (36.8 C)   97.7 F (36.5 C)  TempSrc: Oral     SpO2:  93% (!) 89% 97%  Weight:      Height:       Physical Exam Vitals and nursing note reviewed.  Constitutional:      General: He is not in acute distress.    Comments: Somewhat disheveled appearance  HENT:     Head: Normocephalic and atraumatic.  Cardiovascular:     Rate and Rhythm: Normal rate and regular rhythm.     Heart sounds: Normal heart sounds.  Pulmonary:     Effort: Pulmonary effort is normal.     Breath sounds: Normal breath sounds.  Abdominal:     Palpations: Abdomen is soft.     Tenderness: There is no abdominal tenderness.  Neurological:     Mental Status: Mental status is at baseline.     Data Reviewed: Relevant notes from primary care and specialist visits, past discharge summaries as available in EHR, including Care Everywhere. Prior diagnostic testing as pertinent to current admission diagnoses Updated medications and problem lists for reconciliation ED course, including vitals, labs, imaging, treatment and response to treatment Triage notes, nursing and pharmacy notes and ED provider's notes Notable results as noted in HPI    Assessment and Plan: * Syncope and collapse Hypotension, orthostatic Hypotension possibly orthostatic-patient became hypertensive following fluid resuscitation No stigmata of infection, low suspicion for  ACS, toxicology negative D-dimer ordered after admission resulted at 1.7 so getting CTA to evaluate for PE Ordered syncope workup to include continuous cardiac monitoring, echocardiogram and carotid Doppler Will continue IV fluids Daily orthostatics Fall precautions and neurologic checks Hold antihypertensives Addendum: -Carotid Doppler-->No hemodynamically significant stenosis -MRI brain-->Patient declines -CTA chest--> negative for PE   AKI (acute kidney injury) Creatinine 1.72, up from baseline of 1.10 about a year ago Patient reports a single episode of vomiting, which is possibly the etiology and related to dehydration Likely ATN related to renal hypoperfusion from hypotension Expecting improvement with IV fluid resuscitation and blood pressure normal elevated  Hypoxia Patient was intermittently hypoxic in the ED  to 88% Getting CTA chest to evaluate for PE-has elevated D-dimer--> negative for PE Supplemental oxygen to wean as tolerated  Posterior scalp hematoma, initial encounter Secondary to fall/syncope Pain control Ice packs  History of CVA (cerebrovascular accident) Nonfocal exam Follow-up MRI brain Continue aspirin  and statins pending med rec  Depression Continue bupropion  and fluoxetine  and trazodone  pending med rec  Essential hypertension Holding antihypertensives due to hypotension-amlodipine , carvedilol  and lisinopril       Advance Care Planning:   Code Status: Prior   Consults: none  Family Communication: none  Severity of Illness: The appropriate patient status for this patient is OBSERVATION. Observation status is judged to be reasonable and necessary in order to provide the required intensity of service to ensure the patient's safety. The patient's presenting symptoms, physical exam findings, and initial radiographic and laboratory data in the context of their medical condition is felt to place them at decreased risk for further clinical  deterioration. Furthermore, it is anticipated that the patient will be medically stable for discharge from the hospital within 2 midnights of admission.   Author: Delayne LULLA Solian, MD 11/30/2023 11:48 PM  For on call review [email or web address].

## 2023-11-30 NOTE — Assessment & Plan Note (Addendum)
 Patient with hypoxia in mid to high 80s specially with ambulation.  CTA chest was negative for PE but did show emphysema, mildly elevated D-dimer. - Continue supplemental oxygen-wean as tolerated -Might have become oxygen dependent with his emphysema

## 2023-12-01 ENCOUNTER — Observation Stay

## 2023-12-01 ENCOUNTER — Observation Stay: Admit: 2023-12-01 | Discharge: 2023-12-01 | Disposition: A | Attending: Internal Medicine

## 2023-12-01 DIAGNOSIS — I639 Cerebral infarction, unspecified: Secondary | ICD-10-CM

## 2023-12-01 DIAGNOSIS — N179 Acute kidney failure, unspecified: Secondary | ICD-10-CM | POA: Diagnosis not present

## 2023-12-01 DIAGNOSIS — I951 Orthostatic hypotension: Secondary | ICD-10-CM | POA: Diagnosis not present

## 2023-12-01 DIAGNOSIS — S0003XA Contusion of scalp, initial encounter: Secondary | ICD-10-CM

## 2023-12-01 DIAGNOSIS — I10 Essential (primary) hypertension: Secondary | ICD-10-CM

## 2023-12-01 DIAGNOSIS — E861 Hypovolemia: Secondary | ICD-10-CM

## 2023-12-01 DIAGNOSIS — Z8673 Personal history of transient ischemic attack (TIA), and cerebral infarction without residual deficits: Secondary | ICD-10-CM

## 2023-12-01 DIAGNOSIS — R0902 Hypoxemia: Secondary | ICD-10-CM | POA: Diagnosis not present

## 2023-12-01 DIAGNOSIS — R55 Syncope and collapse: Secondary | ICD-10-CM

## 2023-12-01 DIAGNOSIS — F332 Major depressive disorder, recurrent severe without psychotic features: Secondary | ICD-10-CM

## 2023-12-01 LAB — ECHOCARDIOGRAM COMPLETE
AR max vel: 4.19 cm2
AV Peak grad: 3.5 mmHg
Ao pk vel: 0.93 m/s
Area-P 1/2: 2.1 cm2
Height: 76 in
S' Lateral: 3.1 cm
Single Plane A4C EF: 54.1 %
Weight: 3086.44 [oz_av]

## 2023-12-01 LAB — GLUCOSE, CAPILLARY
Glucose-Capillary: 103 mg/dL — ABNORMAL HIGH (ref 70–99)
Glucose-Capillary: 116 mg/dL — ABNORMAL HIGH (ref 70–99)
Glucose-Capillary: 73 mg/dL (ref 70–99)

## 2023-12-01 LAB — BASIC METABOLIC PANEL WITH GFR
Anion gap: 11 (ref 5–15)
BUN: 12 mg/dL (ref 6–20)
CO2: 22 mmol/L (ref 22–32)
Calcium: 8.1 mg/dL — ABNORMAL LOW (ref 8.9–10.3)
Chloride: 102 mmol/L (ref 98–111)
Creatinine, Ser: 1.27 mg/dL — ABNORMAL HIGH (ref 0.61–1.24)
GFR, Estimated: >60 mL/min (ref >60)
Glucose, Bld: 94 mg/dL (ref 70–99)
Potassium: 4 mmol/L (ref 3.5–5.1)
Sodium: 135 mmol/L (ref 135–145)

## 2023-12-01 LAB — HEMOGLOBIN A1C
Hgb A1c MFr Bld: 5.1 % (ref 4.8–5.6)
Mean Plasma Glucose: 99.67 mg/dL

## 2023-12-01 LAB — HIV ANTIBODY (ROUTINE TESTING W REFLEX): HIV Screen 4th Generation wRfx: NONREACTIVE

## 2023-12-01 MED ORDER — LACTATED RINGERS IV SOLN: INTRAVENOUS | Status: AC

## 2023-12-01 MED ORDER — CLOPIDOGREL BISULFATE 75 MG PO TABS
75.0000 mg | ORAL_TABLET | Freq: Every day | ORAL | Status: DC
  Administered 2023-12-01 – 2023-12-03 (×3): 75 mg via ORAL
  Filled 2023-12-01 (×3): qty 1

## 2023-12-01 MED ORDER — AMLODIPINE BESYLATE 10 MG PO TABS
10.0000 mg | ORAL_TABLET | Freq: Every day | ORAL | Status: DC
  Administered 2023-12-01 – 2023-12-03 (×3): 10 mg via ORAL
  Filled 2023-12-01 (×3): qty 1

## 2023-12-01 MED ORDER — UMECLIDINIUM BROMIDE 62.5 MCG/ACT IN AEPB
1.0000 | INHALATION_SPRAY | Freq: Every day | RESPIRATORY_TRACT | Status: DC
  Administered 2023-12-01 – 2023-12-03 (×3): 1 via RESPIRATORY_TRACT
  Filled 2023-12-01: qty 7

## 2023-12-01 MED ORDER — CARVEDILOL 6.25 MG PO TABS
6.2500 mg | ORAL_TABLET | Freq: Two times a day (BID) | ORAL | Status: DC
  Administered 2023-12-01 – 2023-12-03 (×4): 6.25 mg via ORAL
  Filled 2023-12-01 (×4): qty 1

## 2023-12-01 NOTE — Hospital Course (Addendum)
 Taken from H&P.  Gerald Martin is a 58 y.o. male with medical history significant of HTN, depression, prior CVA, COPD with emphysema, chronic back pain, being admitted for syncope workup.  Patient was at the bank when he started feeling unwell and then passed out falling, hitting his head.  He was awake by arrival of EMS.  Patient had 1 episode of vomiting and blood pressure was soft per EMS  On presentation to the ED blood pressure 84/65 which improved with fluid boluses.,  Hypoxic in 86 to 88% requiring 2 L of oxygen, rest of the vital stable.  Labs with creatinine of 1.65 with no recent baseline, it was 1.1 a year ago.  Sodium 131, ethanol less than 15 and UDS was clean.  respiratory viral panel negative.  Troponin 11, D-dimer 1.72 EKG showing sinus at 66 CT head and C-spine nonacute and chest x-ray showing mild cardiomegaly CTA chest negative for PE, did show emphysema.  11/8: Blood pressure now elevated at 151/112, restarting home amlodipine  and carvedilol . Echocardiogram with normal EF, grade 1 diastolic dysfunction, mildly dilated aortic root and no other significant abnormality.  Renal functions with resolution of hyponatremia and improving creatinine now at 1.27.  Per patient he was not drinking enough and seems like he was dehydrated.  Positive orthostatic vitals, patient also desaturated with ambulation requiring 2 L of oxygen. Giving some more IV fluid and ordered home oxygen. MRI came back positive for a small lacunar infarct involving putamen-PT and OT ordered Consulting neurology.  11/9: Baseline blood pressure mildly elevated but patient still very dizzy with positive orthostatic vitals from sitting to standing. 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  PT is recommending home health.  Still needing 2 L of  oxygen.  11/10: Blood pressure elevated so restarting home lisinopril .  Patient still has positive orthostatic vitals specially from sitting to standing.  Apparently he does has dizziness when he has his prior stroke in 2020.  Patient has significant intracranial atherosclerotic disease which can be contributory.  He was given compression stockings and was told to be slow while changing position to decrease the risk of fall.  He was also given a referral to see outpatient neurology and they can work him up for any autonomic dysfunction.  Patient was instructed to keep himself well-hydrated.  He will use DAPT with aspirin  and Plavix  for 21 days followed by aspirin  only.  He will continue with his statin.  Will continue rest of his home medication and need to have a close follow-up with his providers for further assistance.  Patient was also found to be hypoxic especially with ambulation requiring 2 L of oxygen which was ordered.  History of significant emphysema and need a close follow-up with pulmonary for further assistance.

## 2023-12-01 NOTE — Plan of Care (Signed)
  Problem: Education: Goal: Knowledge of General Education information will improve Description: Including pain rating scale, medication(s)/side effects and non-pharmacologic comfort measures Outcome: Progressing   Problem: Clinical Measurements: Goal: Respiratory complications will improve Outcome: Progressing   Problem: Clinical Measurements: Goal: Cardiovascular complication will be avoided Outcome: Progressing   Problem: Activity: Goal: Risk for activity intolerance will decrease Outcome: Progressing   Problem: Pain Managment: Goal: General experience of comfort will improve and/or be controlled Outcome: Progressing   Problem: Safety: Goal: Ability to remain free from injury will improve Outcome: Progressing

## 2023-12-01 NOTE — Progress Notes (Signed)
  Echocardiogram 2D Echocardiogram has been performed.  Gerald Martin 12/01/2023, 9:47 AM

## 2023-12-01 NOTE — Progress Notes (Addendum)
 Pt requested to do MRI in the morning. MD Cleatus and MRI staff made aware.
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
## 2023-12-01 NOTE — Plan of Care (Signed)
  Problem: Education: Goal: Knowledge of General Education information will improve Description: Including pain rating scale, medication(s)/side effects and non-pharmacologic comfort measures Outcome: Progressing   Problem: Clinical Measurements: Goal: Respiratory complications will improve Outcome: Progressing   Problem: Clinical Measurements: Goal: Cardiovascular complication will be avoided Outcome: Progressing   Problem: Activity: Goal: Risk for activity intolerance will decrease Outcome: Progressing   Problem: Nutrition: Goal: Adequate nutrition will be maintained Outcome: Progressing   Problem: Pain Managment: Goal: General experience of comfort will improve and/or be controlled Outcome: Progressing   Problem: Safety: Goal: Ability to remain free from injury will improve Outcome: Progressing

## 2023-12-01 NOTE — Progress Notes (Incomplete)
SATURATION QUALIFICATIONS: (This note is used to comply with regulatory documentation for home oxygen) ° °Patient Saturations on Room Air at Rest = 88% ° °Patient Saturations on Room Air while Ambulating = 86% ° °Patient Saturations on 2 Liters of oxygen while Ambulating = 91% ° °Please briefly explain why patient needs home oxygen: °

## 2023-12-01 NOTE — Assessment & Plan Note (Signed)
 MRI brain with small focal acute lobular infarct in the right Beideman. Did show cerebral volume loss, accelerated for the patient's age.  Moderate periventricular deep cerebral white matter disease. - Echocardiogram with normal EF, grade 1 diastolic dysfunction and no other significant abnormality. - Patient is on aspirin , Plavix  and statin -Ultrasound carotid was negative for any significant stenosis. - Neurology was consulted -PT and OT evaluation ordered
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
## 2023-12-01 NOTE — TOC CM/SW Note (Signed)
 SATURATION QUALIFICATIONS: (This note is used to comply with regulatory documentation for home oxygen)  Patient Saturations on Room Air at Rest = 88%  Patient Saturations on Room Air while Ambulating = 86%  Patient Saturations on 2 Liters of oxygen while Ambulating = 91%

## 2023-12-02 ENCOUNTER — Observation Stay

## 2023-12-02 DIAGNOSIS — I951 Orthostatic hypotension: Secondary | ICD-10-CM | POA: Diagnosis not present

## 2023-12-02 DIAGNOSIS — N179 Acute kidney failure, unspecified: Secondary | ICD-10-CM | POA: Diagnosis not present

## 2023-12-02 DIAGNOSIS — R0902 Hypoxemia: Secondary | ICD-10-CM | POA: Diagnosis not present

## 2023-12-02 DIAGNOSIS — R55 Syncope and collapse: Secondary | ICD-10-CM | POA: Diagnosis not present

## 2023-12-02 DIAGNOSIS — I639 Cerebral infarction, unspecified: Secondary | ICD-10-CM | POA: Diagnosis not present

## 2023-12-02 LAB — BASIC METABOLIC PANEL WITH GFR
Anion gap: 7 (ref 5–15)
BUN: 12 mg/dL (ref 6–20)
CO2: 26 mmol/L (ref 22–32)
Calcium: 8.1 mg/dL — ABNORMAL LOW (ref 8.9–10.3)
Chloride: 106 mmol/L (ref 98–111)
Creatinine, Ser: 1.06 mg/dL (ref 0.61–1.24)
GFR, Estimated: >60 mL/min (ref >60)
Glucose, Bld: 87 mg/dL (ref 70–99)
Potassium: 3.8 mmol/L (ref 3.5–5.1)
Sodium: 139 mmol/L (ref 135–145)

## 2023-12-02 LAB — LIPID PANEL
Cholesterol: 114 mg/dL (ref 0–200)
HDL: 43 mg/dL (ref >40)
LDL Cholesterol: 63 mg/dL (ref 0–99)
Total CHOL/HDL Ratio: 2.7 ratio
Triglycerides: 40 mg/dL (ref <150)
VLDL: 8 mg/dL (ref 0–40)

## 2023-12-02 MED ORDER — IOHEXOL 350 MG/ML SOLN
75.0000 mL | Freq: Once | INTRAVENOUS | Status: AC | PRN
  Administered 2023-12-02: 75 mL via INTRAVENOUS

## 2023-12-02 MED ORDER — LACTATED RINGERS IV SOLN
INTRAVENOUS | Status: AC
Start: 2023-12-02 — End: 2023-12-03

## 2023-12-02 NOTE — Evaluation (Signed)
 Occupational Therapy Evaluation Patient Details Name: Gerald Martin MRN: [id number] DOB: 12-25-1965 Today's Date: 12/02/2023   History of Present Illness   Gerald Martin is a 58 y.o. male with medical history significant of HTN, depression, prior CVA, chronic back pain, being admitted for syncope workup.  Patient was at the bank when he started feeling unwell and then passed out falling, hitting his head.  He was awake by arrival of EMS.  He continues to feel weak.  States he has not been feeling himself for the past couple days.  States he had a single episode of vomiting which he is unable to describe.  Denies diarrhea, cough, fever or chills.  Has no chest pain, shortness of breath, palpitations.  With EMS BP was in the 80s over 70s.     Clinical Impressions Pt was seen for OT evaluation this date. Prior to hospital admission, pt was living alone, mod indep with ADL, using rollator for mobility, received meds in bubble packs, and required assist for transportation to dr appts, grocery store, and to the bank. Pt standing EOB with nursing upon OT's arrival completing orthostatic vitals. Pt presents with deficits in balance, dizziness with positional changes (orthostatic), and headache, affecting safe and optimal ADL completion. Pt currently requires SBA-CGA for STS transfers and maintaining static standing balance and PRN MIN A for ADL involving transfers/mobility. Pt would benefit from skilled OT services to address noted impairments and functional limitations (see below for any additional details) in order to maximize safety and independence while minimizing future risk of falls, injury, and readmission. Anticipate the need for follow up OT services upon acute hospital DC.    If plan is discharge home, recommend the following:   A little help with bathing/dressing/bathroom;Assistance with cooking/housework;Assist for transportation;Help with stairs or ramp for entrance      Functional Status Assessment   Patient has had a recent decline in their functional status and demonstrates the ability to make significant improvements in function in a reasonable and predictable amount of time.     Equipment Recommendations   None recommended by OT     Recommendations for Other Services         Precautions/Restrictions   Precautions Precautions: Fall Recall of Precautions/Restrictions: Intact Precaution/Restrictions Comments: monitor BP, orthostatics Restrictions Weight Bearing Restrictions Per Provider Order: No     Mobility Bed Mobility Overal bed mobility: Modified Independent                  Transfers Overall transfer level: Needs assistance   Transfers: Sit to/from Stand Sit to Stand: Supervision, Contact guard assist           General transfer comment: 2/2 dizziness      Balance Overall balance assessment: Mild deficits observed, not formally tested                                         ADL either performed or assessed with clinical judgement   ADL Overall ADL's : Needs assistance/impaired                                       General ADL Comments: PRN MIN A to CGA for ADL involving standing/transfers 2/2 dizziness     Vision         Perception  Praxis         Pertinent Vitals/Pain Pain Assessment Pain Assessment: 0-10 Pain Score: 6  Pain Location: head pain and neck Pain Descriptors / Indicators: Aching, Sore, Headache Pain Intervention(s): Limited activity within patient's tolerance, Monitored during session, Repositioned, Patient requesting pain meds-RN notified     Extremity/Trunk Assessment Upper Extremity Assessment Upper Extremity Assessment: Overall WFL for tasks assessed   Lower Extremity Assessment Lower Extremity Assessment: Overall WFL for tasks assessed   Cervical / Trunk Assessment Cervical / Trunk Assessment: Normal   Communication  Communication Communication: No apparent difficulties   Cognition Arousal: Alert Behavior During Therapy: WFL for tasks assessed/performed Cognition: No family/caregiver present to determine baseline             OT - Cognition Comments: no overt cognitive deficits however slow processing noted, pt with headache and dizzy                 Following commands: Intact       Cueing  General Comments   Cueing Techniques: Verbal cues  nursing assessing orthostatic vitals upon OT's arrival, slight LOB noted but pt able to self correct with SBA-CGA from nurse tech, pt endorsing dizziness with +orthostatics   Exercises     Shoulder Instructions      Home Living Family/patient expects to be discharged to:: Private residence Living Arrangements: Alone Available Help at Discharge: Other (Comment) (pt denies assist but reports having an aide who helps provide transportation to dr appts, grocery store, and to the bank) Type of Home: Apartment Home Access: Level entry     Home Layout: One level     Bathroom Shower/Tub: Chief Strategy Officer: Handicapped height Bathroom Accessibility: Yes   Home Equipment: Agricultural Consultant (2 wheels);Grab bars - tub/shower;Shower seat          Prior Functioning/Environment Prior Level of Function : Independent/Modified Independent;History of Falls (last six months);Needs assist             Mobility Comments: use RW around the house and in community ADLs Comments: pt reports indep but increased difficulty over past month with ADL and IADL; aide comes MWF who helps provide transportation to dr appts, grocery store, and to the bank    OT Problem List: Pain;Cardiopulmonary status limiting activity;Impaired balance (sitting and/or standing);Decreased knowledge of use of DME or AE   OT Treatment/Interventions: Self-care/ADL training;Therapeutic activities;Therapeutic exercise;DME and/or AE instruction;Patient/family  education;Balance training      OT Goals(Current goals can be found in the care plan section)   Acute Rehab OT Goals Patient Stated Goal: get better and go home OT Goal Formulation: With patient Time For Goal Achievement: 12/16/23 Potential to Achieve Goals: Good ADL Goals Pt Will Perform Lower Body Dressing: with modified independence;sit to/from stand Pt Will Transfer to Toilet: with modified independence;ambulating (LRAD) Pt Will Perform Toileting - Clothing Manipulation and hygiene: with modified independence Additional ADL Goal #1: Pt will verbalize plan to implement at least 2 learned falls prevention/energy conservation strategies to maximize safety.   OT Frequency:  Min 2X/week    Co-evaluation              AM-PAC OT 6 Clicks Daily Activity     Outcome Measure Help from another person eating meals?: None Help from another person taking care of personal grooming?: None Help from another person toileting, which includes using toliet, bedpan, or urinal?: A Little Help from another person bathing (including washing, rinsing, drying)?: A Little Help from  another person to put on and taking off regular upper body clothing?: None Help from another person to put on and taking off regular lower body clothing?: A Little 6 Click Score: 21   End of Session Equipment Utilized During Treatment: Oxygen Nurse Communication:  (RN aware of need for pain meds, placed pt on 2L O2)  Activity Tolerance: Other (comment);Patient limited by pain (dizziness and pain) Patient left: in bed;with call bell/phone within reach;with bed alarm set  OT Visit Diagnosis: Other abnormalities of gait and mobility (R26.89);Unsteadiness on feet (R26.81);History of falling (Z91.81)                Time: 8586-8576 OT Time Calculation (min): 10 min Charges:  OT General Charges $OT Visit: 1 Visit OT Evaluation $OT Eval Low Complexity: 1 Low  Warren SAUNDERS., MPH, MS, OTR/L ascom [phone] 12/02/23,  2:37 PM

## 2023-12-02 NOTE — Assessment & Plan Note (Signed)
 Patient with hypoxia in mid to high 80s specially with ambulation.  CTA chest was negative for PE but did show emphysema, mildly elevated D-dimer. - Continue supplemental oxygen-wean as tolerated -Might have become oxygen dependent with his emphysema

## 2023-12-02 NOTE — Evaluation (Signed)
 Physical Therapy Evaluation Patient Details Name: Gerald Martin MRN: [id number] DOB: 18-Jul-1965 Today's Date: 12/02/2023  History of Present Illness  Gerald Martin is a 58 y.o. male with medical history significant of HTN, depression, prior CVA, chronic back pain, being admitted for syncope workup.  Patient was at the bank when he started feeling unwell and then passed out falling, hitting his head.  He was awake by arrival of EMS.  He continues to feel weak.  States he has not been feeling himself for the past couple days.  States he had a single episode of vomiting which he is unable to describe.  Denies diarrhea, cough, fever or chills.  Has no chest pain, shortness of breath, palpitations.  With EMS BP was in the 80s over 70s.  Clinical Impression  Patient noted to be in supine position at PT arrival in room, for an initial PT evaluation due to a decline in functional status, with baseline mobility reported as modI with occasional use of RW, and currently requiring modI for transfers. Progressive mobility beyond sitting EOB held due to elevated BP reading of 183/118 mmHg and pt symptomatic. Nurse in room and MD notified.  The patient is A&O x 4, presenting with good willingness to work with PT and goals of getting better. The patient resides in a apartment and lives alone with limited to no with family/friend support. There are no steps to enter inside the residence.  The overall clinical impression is that the patient presents with moderate mobility limitations secondary to elevated BP reading. Recommended skilled PT will address safety, mobility, and discharge planning. PT recommendation to d/c patient to SNF upon medical clearance.        If plan is discharge home, recommend the following: A little help with walking and/or transfers;A little help with bathing/dressing/bathroom   Can travel by private vehicle        Equipment Recommendations    Recommendations for Other  Services       Functional Status Assessment Patient has had a recent decline in their functional status and/or demonstrates limited ability to make significant improvements in function in a reasonable and predictable amount of time     Precautions / Restrictions Precautions Precaution/Restrictions Comments: elevated BP 11/09 10:30AM 183/119 mmHg 69 bpm sitting EOB      Mobility  Bed Mobility Overal bed mobility: Modified Independent                  Transfers Overall transfer level: Modified independent                 General transfer comment: able to sit EOB with no physical assistance; pt. symtomatic when sitting EOB with BP 183/118 mmHg; pt. returned to supine    Ambulation/Gait                  Stairs            Wheelchair Mobility     Tilt Bed    Modified Rankin (Stroke Patients Only)       Balance Overall balance assessment: No apparent balance deficits (not formally assessed) (in sitting)                                           Pertinent Vitals/Pain Pain Assessment Pain Assessment: 0-10 Pain Score: 4  Pain Location: head pain Pain Descriptors / Indicators: Aching,  Sore Pain Intervention(s): Limited activity within patient's tolerance, Monitored during session, Repositioned    Home Living Family/patient expects to be discharged to:: Private residence Living Arrangements: Alone Available Help at Discharge:  (no help) Type of Home: Apartment Home Access: Level entry       Home Layout: One level Home Equipment: Agricultural Consultant (2 wheels)      Prior Function Prior Level of Function : Independent/Modified Independent             Mobility Comments: use RW around the house and in community       Extremity/Trunk Assessment   Upper Extremity Assessment Upper Extremity Assessment: Defer to OT evaluation    Lower Extremity Assessment Lower Extremity Assessment: Overall WFL for tasks assessed     Cervical / Trunk Assessment Cervical / Trunk Assessment: Normal  Communication   Communication Communication: No apparent difficulties    Cognition Arousal: Alert Behavior During Therapy: WFL for tasks assessed/performed   PT - Cognitive impairments: No apparent impairments                         Following commands: Intact       Cueing Cueing Techniques: Verbal cues     General Comments      Exercises     Assessment/Plan    PT Assessment Patient needs continued PT services  PT Problem List Decreased activity tolerance;Pain       PT Treatment Interventions Neuromuscular re-education;Therapeutic activities;Functional mobility training;Patient/family education    PT Goals (Current goals can be found in the Care Plan section)  Acute Rehab PT Goals Patient Stated Goal: Pt wants to get better PT Goal Formulation: With patient Time For Goal Achievement: 12/23/23 Potential to Achieve Goals: Fair    Frequency Min 1X/week     Co-evaluation               AM-PAC PT 6 Clicks Mobility  Outcome Measure Help needed turning from your back to your side while in a flat bed without using bedrails?: None Help needed moving from lying on your back to sitting on the side of a flat bed without using bedrails?: None Help needed moving to and from a bed to a chair (including a wheelchair)?: None Help needed standing up from a chair using your arms (e.g., wheelchair or bedside chair)?: A Little Help needed to walk in hospital room?: A Little Help needed climbing 3-5 steps with a railing? : A Little 6 Click Score: 21    End of Session   Activity Tolerance: Treatment limited secondary to medical complications (Comment) (elevated resting BP at rest) Patient left: in bed;with bed alarm set Nurse Communication: Mobility status (followed up with Dr. Caleen in hall afterwards who recommended standing patient upon next visit with rehab to check orthostatics in leu of  elevated BP reading) PT Visit Diagnosis: Dizziness and giddiness (R42);Other symptoms and signs involving the nervous system (R29.898)    Time: 9040-8981 PT Time Calculation (min) (ACUTE ONLY): 19 min   Charges:   PT Evaluation $PT Eval Low Complexity: 1 Low   PT General Charges $$ ACUTE PT VISIT: 1 Visit         Sherlean Lesches DPT, PT    Stoney Karczewski A Alisyn Lequire 12/02/2023, 10:50 AM

## 2023-12-02 NOTE — Assessment & Plan Note (Signed)
 MRI brain with small focal acute lobular infarct in the right putamen. Did show cerebral volume loss, accelerated for the patient's age.  Moderate periventricular deep cerebral white matter disease. - Echocardiogram with normal EF, grade 1 diastolic dysfunction and no other significant abnormality.  A1c of 5.1 and lipid panel normal.  CTA head and neck with moderate to severe supra clinoid right ICA stenosis and atherosclerosis. - Patient is on aspirin , Plavix  and statin -Ultrasound carotid was negative for any significant stenosis. - Neurology was consulted -PT and OT evaluation -recommending home health

## 2023-12-02 NOTE — Care Management Obs Status (Signed)
 MEDICARE OBSERVATION STATUS NOTIFICATION   Patient Details  Name: Gerald Martin MRN: [id number] Date of Birth: [birth year]   Medicare Observation Status Notification Given:  Yes    [name] 12/02/2023, 5:24 PM

## 2023-12-02 NOTE — Progress Notes (Signed)
 Pt blood sugar at 85 Per NT [place]ia. Glucometer not sinking yet.

## 2023-12-02 NOTE — Assessment & Plan Note (Signed)
 Resolved with creatinine of 1.06 today Likely ATN related to renal hypoperfusion from hypotension - Giving some more IV fluid-positive orthostatic vitals which is slowly improving -Monitor renal function -Avoid nephrotoxins

## 2023-12-02 NOTE — Progress Notes (Signed)
 Progress Note   Patient: Gerald Martin FMW:[id number] DOB: [birth year] DOA: 11/30/2023     0 DOS: the patient was seen and examined on 12/02/2023   Brief hospital course: Taken from H&P.  Gerald Martin is a 58 y.o. male with medical history significant of HTN, depression, prior CVA, COPD with emphysema, chronic back pain, being admitted for syncope workup.  Patient was at the bank when he started feeling unwell and then passed out falling, hitting his head.  He was awake by arrival of EMS.  Patient had 1 episode of vomiting and blood pressure was soft per EMS  On presentation to the ED blood pressure 84/65 which improved with fluid boluses.,  Hypoxic in 86 to 88% requiring 2 L of oxygen, rest of the vital stable.  Labs with creatinine of 1.65 with no recent baseline, it was 1.1 a year ago.  Sodium 131, ethanol less than 15 and UDS was clean.  respiratory viral panel negative.  Troponin 11, D-dimer 1.72 EKG showing sinus at 66 CT head and C-spine nonacute and chest x-ray showing mild cardiomegaly CTA chest negative for PE, did show emphysema.  11/8: Blood pressure now elevated at 151/112, restarting home amlodipine  and carvedilol . Echocardiogram with normal EF, grade 1 diastolic dysfunction, mildly dilated aortic root and no other significant abnormality.  Renal functions with resolution of hyponatremia and improving creatinine now at 1.27.  Per patient he was not drinking enough and seems like he was dehydrated.  Positive orthostatic vitals, patient also desaturated with ambulation requiring 2 L of oxygen. Giving some more IV fluid and ordered home oxygen. MRI came back positive for a small lacunar infarct involving putamen-PT and OT ordered Consulting neurology.  11/9: Baseline blood pressure mildly elevated but patient still very dizzy with positive orthostatic vitals from sitting to standing. 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  PT is recommending home health.  Still needing 2 L of oxygen  Assessment and Plan: * Syncope and collapse Hypotension, orthostatic Positive orthostatic vitals, seems dehydrated.  Remain positive with some improvement. No stigmata of infection, low suspicion for ACS, toxicology negative -Carotid Doppler-->No hemodynamically significant stenosis -CTA chest--> negative for PE -Echocardiogram with normal EF, grade 1 diastolic dysfunction, mildly dilated aortic root and no other significant abnormality.  -Giving some more IV fluid today   AKI (acute kidney injury) Resolved with creatinine of 1.06 today Likely ATN related to renal hypoperfusion from hypotension - Giving some more IV fluid-positive orthostatic vitals which is slowly improving -Monitor renal function -Avoid nephrotoxins  Acute CVA (cerebrovascular accident) (HCC) MRI brain with small focal acute lobular infarct in the right putamen. Did show cerebral volume loss, accelerated for the patient's age.  Moderate periventricular deep cerebral white matter disease. - Echocardiogram with normal EF, grade 1 diastolic dysfunction and no other significant abnormality.  A1c of 5.1 and lipid panel normal.  CTA head and neck with moderate to severe supra clinoid right ICA stenosis and atherosclerosis. - Patient is on aspirin , Plavix  and statin -Ultrasound carotid was negative for any significant stenosis. - Neurology was consulted -PT and OT evaluation -recommending home health  Hypoxia Patient with hypoxia in mid to high 80s specially with ambulation.  CTA chest was negative for PE but did show emphysema, mildly elevated D-dimer. - Continue supplemental oxygen-wean as tolerated -Might have become oxygen dependent with his emphysema  Posterior scalp hematoma, initial encounter Secondary to fall/syncope Pain  control Ice packs  History of CVA (cerebrovascular accident) Nonfocal exam MRI brain with an acute lacunar infarct involving putamen -Continue aspirin , Plavix  and statin  Essential hypertension Blood pressure now elevated.  Initially home antihypertensives were held due to softer blood pressure. - Restarting home carvedilol  and amlodipine  -Continue holding lisinopril   Depression Continue bupropion  and fluoxetine  and trazodone     Subjective: Patient still feeling dizzy with changing position.  Continued to require 2 L of oxygen.  Not feeling well overall.  Physical Exam: Vitals:   12/02/23 0810 12/02/23 0829 12/02/23 1410 12/02/23 1415  BP: (!) 175/118 (!) 175/118    Pulse: 82     Resp:      Temp:      TempSrc:      SpO2: 92%  (!) 89% 95%  Weight:      Height:       General.  Ill-appearing gentleman, in no acute distress. Pulmonary.  Lungs clear bilaterally, normal respiratory effort. CV.  Regular rate and rhythm, no JVD, rub or murmur. Abdomen.  Soft, nontender, nondistended, BS positive. CNS.  Alert and oriented .  No focal neurologic deficit. Extremities.  No edema, no cyanosis, pulses intact and symmetrical. Psychiatry.  Judgment and insight appears normal.    Data Reviewed: Prior data reviewed  Family Communication: Discussed with patient  Disposition: Status is: Observation The patient will require care spanning > 2 midnights and should be moved to inpatient because: Severity of illness  Planned Discharge Destination: Home  DVT prophylaxis.  Lovenox  Time spent: 50 minutes  This record has been created using Conservation officer, historic buildings. Errors have been sought and corrected,but may not always be located. Such creation errors do not reflect on the standard of care.   Author: Amaryllis Dare, MD 12/02/2023 2:45 PM  For on call review [email or web address].

## 2023-12-02 NOTE — Assessment & Plan Note (Signed)
 Hypotension, orthostatic Positive orthostatic vitals, seems dehydrated.  Remain positive with some improvement. No stigmata of infection, low suspicion for ACS, toxicology negative -Carotid Doppler-->No hemodynamically significant stenosis -CTA chest--> negative for PE -Echocardiogram with normal EF, grade 1 diastolic dysfunction, mildly dilated aortic root and no other significant abnormality.  -Giving some more IV fluid today

## 2023-12-03 ENCOUNTER — Other Ambulatory Visit: Payer: Self-pay

## 2023-12-03 ENCOUNTER — Ambulatory Visit: Admission: RE | Admit: 2023-12-03

## 2023-12-03 DIAGNOSIS — N179 Acute kidney failure, unspecified: Secondary | ICD-10-CM | POA: Diagnosis not present

## 2023-12-03 DIAGNOSIS — R29700 NIHSS score 0: Secondary | ICD-10-CM | POA: Diagnosis not present

## 2023-12-03 DIAGNOSIS — I6381 Other cerebral infarction due to occlusion or stenosis of small artery: Secondary | ICD-10-CM | POA: Diagnosis not present

## 2023-12-03 DIAGNOSIS — R55 Syncope and collapse: Secondary | ICD-10-CM | POA: Diagnosis not present

## 2023-12-03 DIAGNOSIS — E785 Hyperlipidemia, unspecified: Secondary | ICD-10-CM

## 2023-12-03 DIAGNOSIS — I10 Essential (primary) hypertension: Secondary | ICD-10-CM | POA: Diagnosis not present

## 2023-12-03 DIAGNOSIS — I951 Orthostatic hypotension: Secondary | ICD-10-CM | POA: Diagnosis not present

## 2023-12-03 DIAGNOSIS — I639 Cerebral infarction, unspecified: Secondary | ICD-10-CM | POA: Diagnosis not present

## 2023-12-03 DIAGNOSIS — Z8673 Personal history of transient ischemic attack (TIA), and cerebral infarction without residual deficits: Secondary | ICD-10-CM | POA: Diagnosis not present

## 2023-12-03 DIAGNOSIS — I959 Hypotension, unspecified: Secondary | ICD-10-CM

## 2023-12-03 MED ORDER — ASPIRIN 81 MG PO TBEC
81.0000 mg | DELAYED_RELEASE_TABLET | Freq: Every day | ORAL | 12 refills | Status: DC
  Filled 2023-12-03: qty 30, 30d supply, fill #0

## 2023-12-03 MED ORDER — LISINOPRIL 20 MG PO TABS
40.0000 mg | ORAL_TABLET | Freq: Every day | ORAL | Status: DC
  Administered 2023-12-03: 40 mg via ORAL
  Filled 2023-12-03: qty 2

## 2023-12-03 MED ORDER — CLOPIDOGREL BISULFATE 75 MG PO TABS
75.0000 mg | ORAL_TABLET | Freq: Every day | ORAL | 0 refills | Status: AC
  Filled 2023-12-03: qty 21, 21d supply, fill #0

## 2023-12-03 NOTE — Discharge Instructions (Signed)
 Follow-up with PCP and specialist

## 2023-12-03 NOTE — Progress Notes (Signed)
 Physical Therapy Treatment Patient Details Name: Gerald Martin MRN: [id number] DOB: [birth year] Today's Date: 12/03/2023   History of Present Illness Gerald Martin is a 58 y.o. male with medical history significant of HTN, depression, prior CVA, chronic back pain, being admitted for syncope workup.  Patient was at the bank when he started feeling unwell and then passed out falling, hitting his head.  He was awake by arrival of EMS.  He continues to feel weak.  States he has not been feeling himself for the past couple days.  States he had a single episode of vomiting which he is unable to describe.  Denies diarrhea, cough, fever or chills.  Has no chest pain, shortness of breath, palpitations.  With EMS BP was in the 80s over 70s.    PT Comments  Patient seen for PT session focused on standing at bedside and normalizing response to movement. Symptoms monitored during session and limited to sit <> stands at bedside with checks for BP, HR and spO2 with each transition. Tolerated session poorly with moderate signs of unsteadiness although pt. requires Supervision to modI to stand at bedside. Pt continues to have symptoms of dizziness and continues to be orthostatic. Pt on 2 L/min. of oxygen and occasional de-stats to >90% spO2 with initial standing. Vitals remained unstable during activity. Main limiting factors today were orthostatic and pt dizziness upon symptom onset. Interventions aimed at improving activity tolerance. Patient shows fair potential to make progress with continued acute level rehab. Patient continues to demonstrate severe activity restrictions and poor tolerance for progressive mobility. Continued skilled PT recommended to progress toward functional goals and support discharge readiness. Pt making good progress toward goals, will continue to follow POC. Discharge recommendation remains appropriate.    If plan is discharge home, recommend the following: A little help with walking  and/or transfers;A little help with bathing/dressing/bathroom   Can travel by private vehicle        Equipment Recommendations       Recommendations for Other Services       Precautions / Restrictions Precautions Precautions: Fall Recall of Precautions/Restrictions: Intact Precaution/Restrictions Comments: monitor BP, orthostatic: supine 154/97 mmHg 72 bpm -> standing 147/113 mmHg 78 bpm; sitting 160/104 mmHg 81 bpm -> standing 106/81 mmHg 90 bpm Restrictions Weight Bearing Restrictions Per Provider Order: No     Mobility  Bed Mobility Overal bed mobility: Modified Independent                  Transfers Overall transfer level: Needs assistance   Transfers: Sit to/from Stand Sit to Stand: Supervision, Contact guard assist           General transfer comment: 2/2 dizziness    Ambulation/Gait                   Stairs             Wheelchair Mobility     Tilt Bed    Modified Rankin (Stroke Patients Only)       Balance Overall balance assessment: Needs assistance Sitting-balance support: Feet supported Sitting balance-[name]: Good     Standing balance support: During functional activity Standing balance-[name]: Poor                              Communication    Cognition  Cueing    Exercises      General Comments        Pertinent Vitals/Pain Pain Assessment Pain Score: 6  Pain Location: head pain and neck Pain Descriptors / Indicators: Aching, Sore, Headache Pain Intervention(s): Limited activity within patient's tolerance, Monitored during session, Repositioned    Home Living                          Prior Function            PT Goals (current goals can now be found in the care plan section) Acute Rehab PT Goals Patient Stated Goal: Pt wants to get better PT Goal Formulation: With patient Time For Goal Achievement:  12/23/23 Potential to Achieve Goals: Fair    Frequency    Min 1X/week      PT Plan      Co-evaluation              AM-PAC PT 6 Clicks Mobility   Outcome Measure  Help needed turning from your back to your side while in a flat bed without using bedrails?: None Help needed moving from lying on your back to sitting on the side of a flat bed without using bedrails?: None Help needed moving to and from a bed to a chair (including a wheelchair)?: None Help needed standing up from a chair using your arms (e.g., wheelchair or bedside chair)?: A Little Help needed to walk in hospital room?: A Little Help needed climbing 3-5 steps with a railing? : A Little 6 Click Score: 21    End of Session   Activity Tolerance: Treatment limited secondary to medical complications (Comment) Patient left: in bed;with bed alarm set Nurse Communication: Mobility status PT Visit Diagnosis: Dizziness and giddiness (R42);Other symptoms and signs involving the nervous system (R29.898)     Time: 9068-9053 PT Time Calculation (min) (ACUTE ONLY): 15 min  Charges:    $Therapeutic Activity: 8-22 mins PT General Charges $$ ACUTE PT VISIT: 1 Visit                     Sherlean Lesches DPT, PT     Sherlean A Azaylea Maves 12/03/2023, 10:13 AM

## 2023-12-03 NOTE — Discharge Summary (Signed)
 Physician Discharge Summary   Patient: Gerald Martin MRN: [id number] DOB: August 17, 1965  Admit date:     11/30/2023  Discharge date: 12/03/23  Discharge Physician: Amaryllis Dare   PCP: Kristina Tinnie POUR, PA-C   Recommendations at discharge:  Please obtain CBC and BMP on follow-up Patient now need 2 liter of oxygen-monitoring and titration as outpatient by pulmonology. Follow-up with neurology-Will get benefit from autonomic workup and stroke follow-up. Please continue counseling for cigarette smoking cessation Follow-up with primary care provider Follow-up with neurology Follow-up with pulmonology  Discharge Diagnoses: Principal Problem:   Syncope and collapse Active Problems:   Acute CVA (cerebrovascular accident) (HCC)   AKI (acute kidney injury)   Hypoxia   Posterior scalp hematoma, initial encounter   History of CVA (cerebrovascular accident)   Essential hypertension   Depression   Hypotension   Hospital Course:  Gerald Martin is a 58 y.o. male with medical history significant of HTN, depression, prior CVA, COPD with emphysema, chronic back pain, being admitted for syncope workup.  [place] the [place] when he started feeling unwell and then passed out falling, hitting his head.  He was awake by arrival of EMS.  Patient had 1 episode of vomiting and blood pressure was soft per EMS  On presentation to the ED blood pressure 84/65 which improved with fluid boluses.,  Hypoxic in 86 to 88% requiring 2 L of oxygen, rest of the vital stable.  Labs with creatinine of 1.65 with no recent baseline, it was 1.1 a year ago.  Sodium 131, ethanol less than 15 and UDS was clean.  respiratory viral panel negative.  Troponin 11, D-dimer 1.72 EKG showing sinus at 66 CT head and C-spine nonacute and chest x-ray showing mild cardiomegaly CTA chest negative for PE, did show emphysema.  11/8: Blood pressure now elevated at 151/112, restarting home amlodipine  and  carvedilol . Echocardiogram with normal EF, grade 1 diastolic dysfunction, mildly dilated aortic root and no other significant abnormality.  Renal functions with resolution of hyponatremia and improving creatinine now at 1.27.  Per patient he was not drinking enough and seems like he was dehydrated.  Positive orthostatic vitals, patient also desaturated with ambulation requiring 2 L of oxygen. Giving some more IV fluid and ordered home oxygen. MRI came back positive for a small lacunar infarct involving putamen-PT and OT ordered Consulting neurology.  11/9: Baseline blood pressure mildly elevated but patient still very dizzy with positive orthostatic vitals from sitting to standing. 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  PT is recommending home health.  Still needing 2 L of oxygen.  11/10: Blood pressure elevated so restarting home lisinopril .  Patient still has positive orthostatic vitals specially from sitting to standing.  Apparently he does has dizziness when he has his prior stroke in 2020.  Patient has significant intracranial atherosclerotic disease which can be contributory.  He was given compression stockings and was told to be slow while changing position to decrease the risk of fall.  He was also given a referral to see outpatient neurology and they can work him up for any autonomic dysfunction.  Patient was instructed to keep himself well-hydrated.  He will use DAPT with aspirin  and Plavix  for 21 days followed by aspirin  only.  He will continue with his statin.  Will continue rest of his home medication and need to have  a close follow-up with his providers for further assistance.  Patient was also found to be hypoxic especially with ambulation requiring 2 L of oxygen which was ordered.  History of significant emphysema  and need a close follow-up with pulmonary for further assistance.  Assessment and Plan: * Syncope and collapse Hypotension, orthostatic Positive orthostatic vitals, seems dehydrated.  Remain positive with some improvement. No stigmata of infection, low suspicion for ACS, toxicology negative -Carotid Doppler-->No hemodynamically significant stenosis -CTA chest--> negative for PE -Echocardiogram with normal EF, grade 1 diastolic dysfunction, mildly dilated aortic root and no other significant abnormality.  -CT head and neck with significant atherosclerotic disease Received IV fluid, still having positive orthostatic vitals especially from sitting to standing, mild dizziness which resolved spontaneously.  Patient was instructed to take it slow and use compression stockings.  He will get benefit from outpatient evaluation for any autonomic dysfunction.  AKI (acute kidney injury) Resolved with IV fluid Likely ATN related to renal hypoperfusion from hypotension on  Acute CVA (cerebrovascular accident) (HCC) MRI brain with small focal acute lobular infarct in the right putamen. Did show cerebral volume loss, accelerated for the patient's age.  Moderate periventricular deep cerebral white matter disease. - Echocardiogram with normal EF, grade 1 diastolic dysfunction and no other significant abnormality.  A1c of 5.1 and lipid panel normal.  CTA head and neck with moderate to severe supra clinoid right ICA stenosis and atherosclerosis. - Patient is on aspirin , Plavix  and statin, neurology is recommending DAPT for 21 days followed by aspirin  and statin. -Ultrasound carotid was negative for any significant stenosis. - Neurology was consulted -PT and OT evaluation -recommending home health  Hypoxia Patient with hypoxia in mid to high 80s specially with ambulation.  CTA chest was negative for PE but did show emphysema, mildly elevated D-dimer. - have become oxygen dependent with his emphysema - Home  oxygen was ordered and pulmonary can titrate as needed  Posterior scalp hematoma, initial encounter Secondary to fall/syncope Pain control Ice packs  History of CVA (cerebrovascular accident) Nonfocal exam MRI brain with an acute lacunar infarct involving putamen -Continue aspirin , Plavix  and statin  Essential hypertension Blood pressure now elevated.  Initially home antihypertensives were held due to softer blood pressure. - Restarting home carvedilol  and amlodipine  -Continue holding lisinopril   Depression Continue bupropion  and fluoxetine  and trazodone    Consultants: Neurology Procedures performed: None Disposition: Home health Diet recommendation:  Discharge Diet Orders (From admission, onward)     Start     Ordered   12/03/23 0000  Diet - low sodium heart healthy        12/03/23 1321           Cardiac diet DISCHARGE MEDICATION: Allergies as of 12/03/2023       Reactions   Penicillins Other (See Comments)        Medication List     TAKE these medications    amLODipine  10 MG tablet Commonly known as: NORVASC  TAKE 1 TABLET BY MOUTH ONCE DAILY   aspirin  EC 81 MG tablet Commonly known as: Aspirin  EC Adult Low Dose Take 1 tablet (81 mg total) by mouth daily. Swallow whole. Start taking on: December 04, 2023 What changed: additional instructions   atorvastatin  40 MG tablet Commonly known as: LIPITOR TAKE 1 TABLET BY MOUTH ONCE DAILY AT 6:00PM   Breztri  Aerosphere 160-9-4.8 MCG/ACT Aero inhaler Generic drug: budesonide-glycopyrrolate-formoterol Inhale 2 puffs into the lungs 2 (two) times daily.   buPROPion  300 MG 24 hr tablet Commonly  known as: WELLBUTRIN  XL TAKE 1 TABLET BY MOUTH ONCE DAILY   carvedilol  6.25 MG tablet Commonly known as: COREG  TAKE 1 TABLET BY MOUTH TWICE DAILY WITH A MEAL   clopidogrel  75 MG tablet Commonly known as: PLAVIX  Take 1 tablet (75 mg total) by mouth daily for 21 days. Start taking on: December 04, 2023    Combivent  Respimat 20-100 MCG/ACT Aers respimat Generic drug: Ipratropium-Albuterol  Inhale 1 puff into the lungs every 6 (six) hours as needed for wheezing.   FLUoxetine  40 MG capsule Commonly known as: PROZAC  TAKE 1 CAPSULE BY MOUTH ONCE DAILY   hydrocortisone  cream 0.5 % Apply 1 Application topically 2 (two) times daily.   Incruse Ellipta  62.5 MCG/ACT Aepb Generic drug: umeclidinium bromide  INHALE 1 PUFF INTO THE LUNGS ONCE DAILY.*RINSE MOUTH AFTER USE AS DIRECTED   lisinopril  40 MG tablet Commonly known as: ZESTRIL  TAKE 1 TABLET BY MOUTH ONCE DAILY   meclizine  25 MG tablet Commonly known as: ANTIVERT  Take 1 tablet (25 mg total) by mouth 3 (three) times daily as needed for dizziness.   omeprazole  20 MG capsule Commonly known as: PRILOSEC TAKE 1 CAPSULE BY MOUTH ONCE DAILY   traMADol  50 MG tablet Commonly known as: ULTRAM  TAKE 1 TABLET BY MOUTH DAILY AS NEEDED FOR PAIN   traZODone  50 MG tablet Commonly known as: DESYREL  TAKE 1 TABLET BY MOUTH AT BEDTIME               Durable Medical Equipment  (From admission, onward)           Start     Ordered   12/01/23 1348  For home use only DME oxygen  Once       Question Answer Comment  Length of Need Lifetime   Mode or (Route) Nasal cannula   Liters per Minute 2   Frequency Continuous (stationary and portable oxygen unit needed)   Oxygen conserving device Yes   Oxygen delivery system: Gas   Oxygen delivery system: Portable concentrator (POC)      12/01/23 1347            Follow-up Information     McDonough, Tinnie POUR, PA-C. Schedule an appointment as soon as possible for a visit in 1 week(s).   Specialty: Physician Assistant Contact information: [phone] [address] [place] [address] [phone]                Discharge Exam: Gerald Martin   11/30/23 2335 12/02/23 0500 12/03/23 0503  Weight: 87.5 kg 94.4 kg 94.6 kg   General. Frail gentleman, in no acute distress.  Appears older than  stated age. Pulmonary.  Lungs clear bilaterally, normal respiratory effort. CV.  Regular rate and rhythm, no JVD, rub or murmur. Abdomen.  Soft, nontender, nondistended, BS positive. CNS.  Alert and oriented .  No focal neurologic deficit. Extremities.  No edema, no cyanosis, pulses intact and symmetrical.   Condition at discharge: stable  The results of significant diagnostics from this hospitalization (including imaging, microbiology, ancillary and laboratory) are listed below for reference.   Imaging Studies: CT ANGIO HEAD NECK W WO CM Result Date: 12/02/2023 EXAM: CTA Head and Neck with Intravenous Contrast. CT Head without Contrast. CLINICAL HISTORY: 58 year old male with neurologic deficit, follow-up for stroke and small right basal ganglia lacunar infarct on brain MRI yesterday. TECHNIQUE: Axial CTA images of the head and neck performed with intravenous contrast. MIP reconstructed images were created and reviewed. Axial computed tomography images of the head/brain performed without intravenous  contrast. Note: Per PQRS, the description of internal carotid artery percent stenosis, including 0 percent or normal exam, is based on [employer] Symptomatic Carotid Endarterectomy Trial (NASCET) criteria. Dose reduction technique was used including one or more of the following: automated exposure control, adjustment of mA and kV according to patient size, and/or iterative reconstruction. CONTRAST: Without and with; 75 mL iohexol  (OMNIPAQUE ) 350 MG/ML injection. COMPARISON: Brain MRI 12/01/2023. Head CT 11/30/2023. Thyroid  ultrasound 06/01/2018. FINDINGS: CT HEAD: BRAIN: Small right lentiform lacunar infarct remains occult by CT. Stable gray white differentiation. No new intracranial abnormality. No acute intraparenchymal hemorrhage. No mass lesion. No CT evidence for acute territorial infarct. No midline shift or extra-axial collection. VENTRICLES: No hydrocephalus. ORBITS: The orbits are unremarkable.  SINUSES AND MASTOIDS: Chronic left maxillary sinus disease with mucoperiosteal thickening. The remaining paranasal sinuses and mastoid air cells are clear. CTA NECK: COMMON CAROTID ARTERIES: Right CCA: Mildly tortuous with no significant plaque or stenosis. Left CCA: Bovine left CCA origin with calcified plaque. Other calcified plaque in the vessel proximal to the bifurcation. Dense left internal jugular venous contrast reflux and streak artifact limits detail of the left CCA. INTERNAL CAROTID ARTERIES: Right ICA: Calcified plaque at the right carotid bifurcation without stenosis. Bulky calcified plaque at the right ICA bulb with less than 50% stenosis. Right ICA siphon appears patent, with moderate calcified plaque but limited siphon detail due to contrast timing. Evidence of moderate to severe supraclinoid right ICA stenosis due to calcified plaque on series 14 image 120. Left ICA: Moderate calcified plaque at the left ICA origin and bulb. Enhancement of the distal left ICA in the neck remains symmetric arguing against hemodynamically significant stenosis. Left ICA siphon is patent, with moderate calcified plaque, limited siphon detail due to contrast timing. No high grade left siphon stenosis suspected. VERTEBRAL ARTERIES: Right vertebral artery: Mild calcified plaque at the origin. Left vertebral artery: Limited vertebral artery detail throughout the neck due to contrast timing and venous streak artifact. Both distal vertebral arteries are patent and appear codominant. Normal V4 segments and vertebrobasilar junction. PICA origins appear patent. Overall: No significant stenosis, dissection, or occlusion of the vertebral arteries. CTA HEAD: ANTERIOR CEREBRAL ARTERIES: Bilateral ACA origins are normal. When allowing for contrast timing, the bilateral ACA branches are within normal limits. Normal anterior communicating artery. MIDDLE CEREBRAL ARTERIES: Bilateral MCA origins are normal. When allowing for contrast  timing, the bilateral MCA branches are within normal limits. POSTERIOR CEREBRAL ARTERIES: Patent PCA origins. Fetal type left PCA origin. When allowing for contrast timing, the bilateral PCA branches are within normal limits. BASILAR ARTERY: Patent basilar artery without stenosis. No aneurysm. 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 Pronounced intravenous contrast reflux up into the neck via the left IJ. Subsequently, the arterial contrast bolus is suboptimal. Bovine arch configuration. Calcified arch atherosclerosis. Tortuous brachiocephalic artery with calcified plaque, no stenosis. Right subclavian origin calcified plaque without stenosis. Proximal left subclavian artery is patent with calcified plaque. Patent SCA origins. SOFT TISSUES: Postinflammatory soft tissue changes at the bilateral tonsillar pillars. No acute finding identified in the nonvascular neck soft tissues. No masses or lymphadenopathy. BONES: Reversal of the normal cervical lordosis. No acute osseous abnormality. LUNGS: Partially visible confluent right apical lung scarring with bullous changes. LIMITATIONS/ARTIFACTS: Suboptimal CTA head and neck due to functional stenosis of the innominate vein, artifact from pronounced venous contrast reflux in the left neck.  Limited vertebral artery detail throughout the neck due to contrast timing and venous streak artifact. Limited siphon detail of the right and left ICA due to contrast timing. IMPRESSION: 1. Small right lentiform lacunar infarct remains occult by CT. No new intracranial abnormality by CT. 2. Suboptimal CTA head and neck due to functional stenosis of the innominate vein, artifact from pronounced venous contrast reflux in the left neck. 3. Moderate to severe supraclinoid right ICA stenosis suspected due to calcified plaque. No other high-grade stenosis  identified despite atherosclerosis in the neck and at the skull base. 4. Partially visible advanced right apical lung scarring. Electronically signed by: Helayne Hurst MD 12/02/2023 11:36 AM EST RP Workstation: [id number]   MR BRAIN WO CONTRAST Result Date: 12/01/2023 EXAM: MRI BRAIN WITHOUT CONTRAST 12/01/2023 10:23:00 AM TECHNIQUE: Multiplanar multisequence MRI of the head/brain was performed without the administration of intravenous contrast. COMPARISON: CT of the head dated 11/30/2023 and MRI of the head dated 05/31/2018. CLINICAL HISTORY: Neuro deficit, acute, stroke suspected. FINDINGS: BRAIN AND VENTRICLES: Small focal acute lacunar infarct present within the right putamen seen on image 30 of series 5. There is cerebral volume loss which appears accelerated for the patient's age. There is moderate periventricular and deep cerebral white matter disease. There is a prominent Sales Executive. No intracranial hemorrhage. No mass. No midline shift. No hydrocephalus. The sella is unremarkable. Normal flow voids. ORBITS: No acute abnormality. SINUSES AND MASTOIDS: Near complete opacification of the left maxillary sinus which is relatively hypoplastic. BONES AND SOFT TISSUES: Normal marrow signal. No acute soft tissue abnormality. IMPRESSION: 1. Small focal acute lacunar infarct in the right putamen. 2. Cerebral volume loss, accelerated for the patient's age. 3. Moderate periventricular and deep cerebral white matter disease. 4. Near complete opacification of the relatively hypoplastic left maxillary sinus. Electronically signed by: Evalene Coho MD 12/01/2023 10:33 AM EST RP Workstation: [id number]   ECHOCARDIOGRAM COMPLETE Result Date: 12/01/2023    ECHOCARDIOGRAM REPORT   Patient Name:   Gerald Martin Date of Exam: 12/01/2023 Medical Rec #:  [id number]           Height:       76.0 in Accession #:    [id number]          Weight:       192.9 lb Date of Birth:  [birth year]            BSA:          2.181 m  Patient Age:    58 years            BP:           133/109 mmHg Patient Gender: M                   HR:           72 bpm. Exam Location:  [facility] Procedure: 2D Echo, Cardiac Doppler, Color Doppler and Strain Analysis (Both            Spectral and Color Flow Doppler were utilized during procedure). Indications:     Syncope R55  History:         Patient has prior history of Echocardiogram examinations, most                  recent 06/03/2018.  Sonographer:     Thedora Louder RDCS, FASE Referring Phys:  [id number] DELAYNE LULLA SOLIAN Diagnosing Phys: Caron Poser  Sonographer Comments: Global longitudinal strain was attempted. IMPRESSIONS  1.  Left ventricular ejection fraction, by estimation, is 60 to 65%. The left ventricle has normal function. Left ventricular endocardial border not optimally defined to evaluate regional wall motion. There is moderate concentric left ventricular hypertrophy. Left ventricular diastolic parameters are consistent with Grade I diastolic dysfunction (impaired relaxation).  2. Right ventricular systolic function is normal. The right ventricular size is normal. Tricuspid regurgitation signal is inadequate for assessing PA pressure.  3. The mitral valve is normal in structure. Trivial mitral valve regurgitation. No evidence of mitral stenosis.  4. The aortic valve is tricuspid. Aortic valve regurgitation is not visualized. No aortic stenosis is present.  5. Aortic dilatation noted. There is mild dilatation of the aortic root, measuring 43 mm. There is mild dilatation of the ascending aorta, measuring 40 mm. Comparison(s): A prior study was performed on 06/02/2018. No significant change from prior study. Conclusion(s)/Recommendation(s): Normal biventricular function without evidence of hemodynamically significant valvular heart disease. FINDINGS  Left Ventricle: Left ventricular ejection fraction, by estimation, is 60 to 65%. The left ventricle has normal function. Left ventricular endocardial border  not optimally defined to evaluate regional wall motion. Global longitudinal strain performed but  not reported based on interpreter judgement due to suboptimal tracking. The left ventricular internal cavity size was normal in size. There is moderate concentric left ventricular hypertrophy. Left ventricular diastolic parameters are consistent with Grade I diastolic dysfunction (impaired relaxation). Right Ventricle: The right ventricular size is normal. Right vetricular wall thickness was not well visualized. Right ventricular systolic function is normal. Tricuspid regurgitation signal is inadequate for assessing PA pressure. Left Atrium: Left atrial size was normal in size. Right Atrium: Right atrial size was normal in size. Pericardium: There is no evidence of pericardial effusion. Mitral Valve: The mitral valve is normal in structure. Trivial mitral valve regurgitation. No evidence of mitral valve stenosis. Tricuspid Valve: The tricuspid valve is not well visualized. Tricuspid valve regurgitation is not demonstrated. No evidence of tricuspid stenosis. Aortic Valve: The aortic valve is tricuspid. Aortic valve regurgitation is not visualized. No aortic stenosis is present. Aortic valve peak gradient measures 3.5 mmHg. Pulmonic Valve: The pulmonic valve was normal in structure. Pulmonic valve regurgitation is not visualized. No evidence of pulmonic stenosis. Aorta: Aortic dilatation noted. There is mild dilatation of the aortic root, measuring 43 mm. There is mild dilatation of the ascending aorta, measuring 40 mm. Venous: The inferior vena cava was not well visualized. IAS/Shunts: The interatrial septum was not well visualized.  LEFT VENTRICLE PLAX 2D LVIDd:         4.70 cm     Diastology LVIDs:         3.10 cm     LV e' medial:    5.66 cm/s LV PW:         1.60 cm     LV E/e' medial:  6.9 LV IVS:        1.50 cm     LV e' lateral:   4.03 cm/s LVOT diam:     2.60 cm     LV E/e' lateral: 9.6 LV SV:         73 LV SV  Index:   33 LVOT Area:     5.31 cm LV IVRT:       147 msec  LV Volumes (MOD) LV vol d, MOD A4C: 88.8 ml LV vol s, MOD A4C: 40.8 ml LV SV MOD A4C:     88.8 ml RIGHT VENTRICLE RV Basal diam:  2.80 cm  PULMONARY VEINS RV S prime:     18.70 cm/s  Diastolic Velocity: 44.60 cm/s TAPSE (M-mode): 2.8 cm      S/D Velocity:       1.10                             Systolic Velocity:  47.10 cm/s LEFT ATRIUM             Index        RIGHT ATRIUM           Index LA diam:        4.00 cm 1.83 cm/m   RA Area:     17.00 cm LA Vol (A2C):   45.3 ml 20.77 ml/m  RA Volume:   39.80 ml  18.24 ml/m LA Vol (A4C):   43.7 ml 20.03 ml/m LA Biplane Vol: 45.5 ml 20.86 ml/m  AORTIC VALVE                 PULMONIC VALVE AV Area (Vmax): 4.19 cm     PV Vmax:        0.69 m/s AV Vmax:        93.00 cm/s   PV Peak grad:   1.9 mmHg AV Peak Grad:   3.5 mmHg     RVOT Peak grad: 1 mmHg LVOT Vmax:      73.40 cm/s LVOT Vmean:     48.400 cm/s LVOT VTI:       0.137 m  AORTA Ao Root diam: 4.30 cm Ao Asc diam:  4.00 cm MITRAL VALVE MV Area (PHT): 2.10 cm    SHUNTS MV Decel Time: 361 msec    Systemic VTI:  0.14 m MV E velocity: 38.80 cm/s  Systemic Diam: 2.60 cm MV A velocity: 79.70 cm/s MV E/A ratio:  0.49 [place] Electronically signed by Caron Poser Signature Date/Time: 12/01/2023/10:00:44 AM    Final    CT Angio Chest Pulmonary Embolism (PE) W or WO Contrast Result Date: 12/01/2023 EXAM: CTA of the Chest with contrast for PE 12/01/2023 12:08:06 AM TECHNIQUE: CTA of the chest was performed after the administration of 75 mL of iohexol  (OMNIPAQUE ) 350 MG/ML injection. Multiplanar reformatted images are provided for review. MIP images are provided for review. Automated exposure control, iterative reconstruction, and/or weight based adjustment of the mA/kV was utilized to reduce the radiation dose to as low as reasonably achievable. COMPARISON: X-ray of 11/30/2023 and CT of 12/18/2022. CLINICAL HISTORY: Pulmonary embolism (PE) suspected, low to  intermediate prob, neg D-dimer. Hypoxia FINDINGS: PULMONARY ARTERIES: No pulmonary embolism. Main pulmonary artery is normal in caliber. MEDIASTINUM: The heart and pericardium demonstrate coronary artery atherosclerotic calcification. There is aortic atherosclerotic calcification. LYMPH NODES: No mediastinal, hilar or axillary lymphadenopathy. LUNGS AND PLEURA: Emphysema. Hypoventilation in the lower lobes. Bullous change in the right apex. No focal consolidation or pulmonary edema. No pleural effusion or pneumothorax. UPPER ABDOMEN: Limited images of the upper abdomen are unremarkable. SOFT TISSUES AND BONES: No acute bone or soft tissue abnormality. IMPRESSION: 1. No evidence of pulmonary embolism. 2. Emphysema; given increased lung cancer risk, recommend continued low-dose CT lung cancer screening program. Electronically signed by: Norman Gatlin MD 12/01/2023 12:33 AM EST RP Workstation: [id number]   US  Carotid Bilateral Result Date: 11/30/2023 EXAM: US  CAROTID DUPLEX 11/30/2023 11:10:12 PM TECHNIQUE: Real-time grayscale, color flow and spectral Doppler sonographic images were obtained of the extracranial carotid system using a linear transducer. COMPARISON: Ultrasound 5 /  9 / 20 CLINICAL HISTORY: Syncope and collapse. FINDINGS: RIGHT: Common carotid artery: 55 cm/s (EDV 22 cm/s) Internal carotid artery: 89 cm/s (EDV 13 cm/s) External carotid artery: 89 cm/s Right ICA/CCA ratio: 0.9 Plaque: Mild atherosclerotic plaque in the carotid bulb . Vertebral artery: Antegrade LEFT: Common carotid artery: 59 cm/s (EDV 14 cm/s) Internal carotid artery: 50 cm/s (EDV 19 cm/s) External carotid artery: 77 cm/s LEFT ICA/CCA ratio: 0.8 Plaque: Mild atherosclerotic plaque in the carotid bulb . Vertebral artery: Antegrade IMPRESSION: 1. No hemodynamically significant stenosis (greater than 50%) within the extracranial internal carotid arteries. Electronically signed by: Norman Gatlin MD 11/30/2023 11:15 PM EST RP Workstation:  [id number]   DG Chest Port 1 View Result Date: 11/30/2023 EXAM: 1 VIEW(S) XRAY OF THE CHEST 11/30/2023 09:52:40 PM COMPARISON: Compared 05/31/2018. CLINICAL HISTORY: 200808 Hypoxia 200808. FINDINGS: LUNGS AND PLEURA: No focal pulmonary opacity. No pleural effusion. No pneumothorax. HEART AND MEDIASTINUM: Mild cardiomegaly. BONES AND SOFT TISSUES: No acute osseous abnormality. IMPRESSION: 1. Mild cardiomegaly. Electronically signed by: Oneil Devonshire MD 11/30/2023 10:00 PM EST RP Workstation: [id number]   CT Cervical Spine Wo Contrast Result Date: 11/30/2023 EXAM: CT CERVICAL SPINE WITHOUT CONTRAST 11/30/2023 04:21:00 PM TECHNIQUE: CT of the cervical spine was performed without the administration of intravenous contrast. Multiplanar reformatted images are provided for review. Automated exposure control, iterative reconstruction, and/or weight based adjustment of the mA/kV was utilized to reduce the radiation dose to as low as reasonably achievable. COMPARISON: CT cervical spine 04/30/2019. CLINICAL HISTORY: Neck trauma, intoxicated or obtunded (Age >= 16y). FINDINGS: CERVICAL SPINE: BONES AND ALIGNMENT: Reversal of the normal cervical lordosis. No acute fracture or traumatic malalignment. DEGENERATIVE CHANGES: Arthrosis and uncovertebral hypertrophy at multiple levels. Foraminal stenosis most pronounced at C6-C7. Degenerative endplate osteophytes at multiple levels. Disc space narrowing most pronounced at C5-C6 and C6-C7. SOFT TISSUES: No prevertebral soft tissue swelling. IMPRESSION: 1. No acute abnormality of the cervical spine related to neck trauma. 2. Degenerative changes as above. Electronically signed by: Donnice Mania MD 11/30/2023 04:43 PM EST RP Workstation: [id number]   CT Head Wo Contrast Result Date: 11/30/2023 EXAM: CT HEAD WITHOUT CONTRAST 11/30/2023 04:21:00 PM TECHNIQUE: CT of the head was performed without the administration of intravenous contrast. Automated exposure control, iterative  reconstruction, and/or weight based adjustment of the mA/kV was utilized to reduce the radiation dose to as low as reasonably achievable. COMPARISON: 11/30/2021 CLINICAL HISTORY: Head trauma, moderate-severe FINDINGS: BRAIN AND VENTRICLES: No acute hemorrhage. No evidence of acute infarct. No hydrocephalus. No extra-axial collection. No mass effect or midline shift. 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 Stable atrophy and periventricular chronic small vessel ischemia. ORBITS: No acute abnormality. SINUSES: Chronic left maxillary sinus disease with surrounding bony sclerosis. SOFT TISSUES AND SKULL: Posterior scalp hematoma. No skull fracture. Atherosclerosis of skullbase vasculature. IMPRESSION: 1. No acute intracranial abnormality related to head trauma. 2. Posterior scalp hematoma. Electronically signed by: Donnice Mania MD 11/30/2023 04:39 PM EST RP Workstation: [id number]    Microbiology: Results for orders placed or performed during the hospital encounter of 11/30/23  Resp panel by RT-PCR (RSV, Flu A&B, Covid) Anterior Nasal Swab     Status: None   Collection Time: 11/30/23  5:51 PM   Specimen: Anterior Nasal Swab  Result Value Ref Range Status   SARS Coronavirus 2 by RT PCR NEGATIVE NEGATIVE Final    Comment: (NOTE) SARS-CoV-2 target nucleic acids are NOT DETECTED.  The SARS-CoV-2 RNA is generally detectable in upper respiratory specimens during  the acute phase of infection. The lowest concentration of SARS-CoV-2 viral copies this assay can detect is 138 copies/mL. A negative result does not preclude SARS-Cov-2 infection and should not be used as the sole basis for treatment or other patient management decisions. A negative result may occur with  improper specimen collection/handling, submission of specimen other than nasopharyngeal swab, presence of viral mutation(s) within the areas targeted by this assay, and inadequate number of viral copies(<138  copies/mL). A negative result must be combined with clinical observations, patient history, and epidemiological information. The expected result is Negative.  Fact Sheet for Patients:  bloggercourse.com  Fact Sheet for Healthcare Providers:  seriousbroker.it  This test is no t yet approved or cleared by the [place]  FDA and  has been authorized for detection and/or diagnosis of SARS-CoV-2 by FDA under an Emergency Use Authorization (EUA). This EUA will remain  in effect (meaning this test can be used) for the duration of the COVID-19 declaration under Section 564(b)(1) of the Act, 21 U.S.C.section 360bbb-3(b)(1), unless the authorization is terminated  or revoked sooner.       Influenza A by PCR NEGATIVE NEGATIVE Final   Influenza B by PCR NEGATIVE NEGATIVE Final    Comment: (NOTE) The Xpert Xpress SARS-CoV-2/FLU/RSV plus assay is intended as an aid in the diagnosis of influenza from Nasopharyngeal swab specimens and should not be used as a sole basis for treatment. Nasal washings and aspirates are unacceptable for Xpert Xpress SARS-CoV-2/FLU/RSV testing.  Fact Sheet for Patients: bloggercourse.com  Fact Sheet for Healthcare Providers: seriousbroker.it  This test is not yet approved or cleared by the [place]  FDA and has been authorized for detection and/or diagnosis of SARS-CoV-2 by FDA under an Emergency Use Authorization (EUA). This EUA will remain in effect (meaning this test can be used) for the duration of the COVID-19 declaration under Section 564(b)(1) of the Act, 21 U.S.C. section 360bbb-3(b)(1), unless the authorization is terminated or revoked.     Resp Syncytial Virus by PCR NEGATIVE NEGATIVE Final    Comment: (NOTE) Fact Sheet for Patients: bloggercourse.com  Fact Sheet for Healthcare  Providers: seriousbroker.it  This test is not yet approved or cleared by the [place]  FDA and has been authorized for detection and/or diagnosis of SARS-CoV-2 by FDA under an Emergency Use Authorization (EUA). This EUA will remain in effect (meaning this test can be used) for the duration of the COVID-19 declaration under Section 564(b)(1) of the Act, 21 U.S.C. section 360bbb-3(b)(1), unless the authorization is terminated or revoked.  Performed at [facility], [address] [place], [place] [address]     Labs: CBC: Recent Labs  Lab 11/30/23 1559  WBC 9.0  HGB 14.7  HCT 44.8  MCV 87.0  PLT 384   Basic Metabolic Panel: Recent Labs  Lab 11/30/23 1559 12/01/23 0253 12/02/23 0453  NA 131* 135 139  K 4.0 4.0 3.8  CL 96* 102 106  CO2 22 22 26   GLUCOSE 136* 94 87  BUN 13 12 12   CREATININE 1.65* 1.27* 1.06  CALCIUM  8.5* 8.1* 8.1*   Liver Function Tests: No results for input(s): AST, ALT, ALKPHOS, BILITOT, PROT, ALBUMIN in the last 168 hours. CBG: Recent Labs  Lab 12/01/23 0043 12/01/23 0839 12/01/23 2140  GLUCAP 103* 73 116*    Discharge time spent: greater than 30 minutes.  This record has been created using Conservation officer, historic buildings. Errors have been sought and corrected,but may not always be located. Such creation errors do  not reflect on the standard of care.   Signed: Amaryllis Dare, MD [facility] 12/03/2023

## 2023-12-03 NOTE — Plan of Care (Signed)

## 2023-12-03 NOTE — TOC Transition Note (Signed)
 Transition of Care [facility]) - Discharge Note   Patient Details  Name: Gerald Martin MRN: [id number] Date of Birth: Oct 01, 1965  [facility] CM/SW Contact:  Shasta DELENA Daring, RN Phone Number: 12/03/2023, 2:47 PM   Clinical Narrative:    It was determined that patient receives companion services from Home [facility]. Companion is [employer]. RNCM spoke with Nisha and she can pick him up today. Prentice, RN, advised Newell that patient would be in the discharge lounge. [facility] notified of new O2 orders. Home health RN, PT, OT and aide request made to [facility]. Confirmed with Shaun that services will be provided and they will contact the patient to set things up.  Patient was educated by [facility], bedside nurse and by his companion Newell that he cannot smoke with O2 in his apartment. Advised that no one can smoke in the apartment and that he will have to go outside to smoke. Patient verbalized understanding.    Final next level of care: Home/Self Care Barriers to Discharge: Continued Medical Work up   Patient Goals and CMS Choice            Discharge Placement                       Discharge Plan and Services Additional resources added to the After Visit Summary for                            HH Arranged: RN, PT, OT, Nurse's Aide HH Agency: [facility]) Date HH Agency Contacted: 12/03/23 Time HH Agency Contacted: 1444 Representative spoke with at [facility] Agency: Shaun (Left VM)  Social Drivers of Health (SDOH) Interventions SDOH Screenings   Food Insecurity: Patient Declined (12/01/2023)  Housing: Patient Declined (12/01/2023)  Transportation Needs: Unmet Transportation Needs (12/01/2023)  Utilities: Not At Risk (12/01/2023)  Alcohol Screen: Low Risk  (07/05/2023)  Depression (PHQ2-9): Low Risk  (07/05/2023)  Tobacco Use: High Risk (11/30/2023)     Readmission Risk Interventions     No data to display

## 2023-12-03 NOTE — Consult Note (Signed)
 NEUROLOGY CONSULT NOTE   Date of service: December 03, 2023 Patient Name: Gerald Martin MRN:  [id number] DOB:  [birth year] Chief Complaint: Dizziness, LOC Requesting Provider: Caleen Qualia, MD  History of Present Illness  Gerald Martin is a 58 y.o. 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  He reports that he has had dizziness now ongoing for over a week and was at a line in his bank, when he suddenly felt more dizzy and passed out. He reports that he has had a stroke in the past with no residual deficits but does not remember having dizziness at that time although chart review reveals that he had a stroke in 2020 and dizziness was present at that time as well. Denies any tingling numbness weakness. ED provider notes mention patient having had liquor that day but his EtOH levels were undetectable. He reports that he gets more dizzy when he is walking or standing up but it could happen even when he is sitting down.  Hospitalist had contacted neurology with curbside recommendations but the patient had not been formally evaluated by neurology till today, when I was contacted for clarification on discharge medications as well as issues managing his blood pressure-he is orthostatic and gets more dizzy when he standing up.   LKW: Prior to presentation-unclear to pinpoint Modified rankin score: 0-Completely asymptomatic and back to baseline post- stroke IV Thrombolysis: Non focal exam at the time of presentation per chart note EVT: Same as above NIH stroke scale 0    ROS  Comprehensive ROS performed and pertinent positives documented in HPI   Past History   Past Medical History:  Diagnosis Date   Anxiety    Chronic pain    back   COPD (chronic obstructive pulmonary disease) (HCC)    Depression    GERD  (gastroesophageal reflux disease)    Hyperlipidemia    Hypertension    Stroke [facility])     Past Surgical History:  Procedure Laterality Date   arm surgery[Other] Left    chain saw injury   chronic pain     LEG SURGERY Right     Family History: Family History  Problem Relation Age of Onset   Stroke Mother    Diabetes Father     Social History  reports that he has been smoking cigarettes. He has a 60 pack-year smoking history. He has never used smokeless tobacco. He reports current alcohol use. He reports that he does not use drugs.  Allergies  Allergen Reactions   Penicillins Other (See Comments)    Medications   Current Facility-Administered Medications:    acetaminophen  (TYLENOL ) tablet 650 mg, 650 mg, Oral, Q6H PRN **OR** acetaminophen  (TYLENOL ) suppository 650 mg, 650 mg, Rectal, Q6H PRN, Gerald Delayne GAILS, MD   amLODipine  (NORVASC ) tablet 10 mg, 10 mg, Oral, Daily, Gerald Martin, Sumayya, MD, 10 mg at 12/03/23 0855   aspirin  EC tablet 81 mg, 81 mg, Oral, Daily, Gerald Delayne V, MD, 81 mg at 12/03/23 0855   atorvastatin  (LIPITOR) tablet 40 mg, 40 mg, Oral, Daily, Gerald Delayne V, MD, 40 mg at 12/03/23 9144   buPROPion  (WELLBUTRIN  XL) 24 hr tablet 300 mg, 300 mg, Oral, Daily, Gerald Delayne V, MD, 300 mg at 12/03/23 9144   carvedilol  (COREG ) tablet 6.25 mg, 6.25 mg, Oral, BID WC, Gerald Martin, Sumayya, MD, 6.25 mg  at 12/03/23 0855   clopidogrel  (PLAVIX ) tablet 75 mg, 75 mg, Oral, Daily, Gerald Martin, Sumayya, MD, 75 mg at 12/03/23 0856   enoxaparin  (LOVENOX ) injection 40 mg, 40 mg, Subcutaneous, Q24H, Gerald Delayne GAILS, MD, 40 mg at 12/03/23 9144   FLUoxetine  (PROZAC ) capsule 40 mg, 40 mg, Oral, Daily, Gerald Martin, Gerald V, MD, 40 mg at 12/03/23 9144   HYDROcodone -acetaminophen  (NORCO/VICODIN) 5-325 MG per tablet 1-2 tablet, 1-2 tablet, Oral, Q4H PRN, Gerald Delayne GAILS, MD, 1 tablet at 12/02/23 2116   lisinopril  (ZESTRIL ) tablet 40 mg, 40 mg, Oral, Daily, Gerald Martin, Sumayya, MD, 40 mg at 12/03/23 0855   ondansetron   (ZOFRAN ) tablet 4 mg, 4 mg, Oral, Q6H PRN **OR** ondansetron  (ZOFRAN ) injection 4 mg, 4 mg, Intravenous, Q6H PRN, Gerald Delayne GAILS, MD   sodium chloride  flush (NS) 0.9 % injection 3 mL, 3 mL, Intravenous, Q12H, Gerald Delayne V, MD, 3 mL at 12/03/23 1046   traZODone  (DESYREL ) tablet 50 mg, 50 mg, Oral, QHS, Gerald Delayne GAILS, MD, 50 mg at 12/02/23 2116   umeclidinium bromide  (INCRUSE ELLIPTA ) 62.5 MCG/ACT 1 puff, 1 puff, Inhalation, Daily, Caleen Qualia, MD, 1 puff at 12/03/23 0857  Vitals   Vitals:   12/03/23 0414 12/03/23 0503 12/03/23 0800 12/03/23 1100  BP: 131/88  (!) 171/119 (!) 149/100  Pulse: 68  75 76  Resp: 20     Temp: (!) 97.5 F (36.4 C)  98.2 F (36.8 C) 98.1 F (36.7 C)  TempSrc:   Oral Oral  SpO2: 91%  94% 95%  Weight:  94.6 kg    Height:        Body mass index is 25.39 kg/m.   Physical Exam   General: Awake alert, somewhat disheveled HEENT: Normocephalic atraumatic Lungs: Clear Cardiovascular: Regular rate rhythm Neurological examination He is awake alert oriented x 3 Speech is not dysarthric No evidence of aphasia Cranial nerves II to XII intact Motor examination with no drift Sensation is intact to light touch Coordination examination reveals no dysmetria  Labs/Imaging/Neurodiagnostic studies   CBC:  Recent Labs  Lab December 07, 2023 1559  WBC 9.0  HGB 14.7  HCT 44.8  MCV 87.0  PLT 384   Basic Metabolic Panel:  Lab Results  Component Value Date   NA 139 12/02/2023   K 3.8 12/02/2023   CO2 26 12/02/2023   GLUCOSE 87 12/02/2023   BUN 12 12/02/2023   CREATININE 1.06 12/02/2023   CALCIUM  8.1 (L) 12/02/2023   GFRNONAA >60 12/02/2023   GFRAA 90 08/22/2019   Lipid Panel:  Lab Results  Component Value Date   LDLCALC 63 12/02/2023   HgbA1c:  Lab Results  Component Value Date   HGBA1C 5.1 12/01/2023   Urine Drug Screen:     Component Value Date/Time   LABOPIA NONE DETECTED 12/07/23 1935   COCAINSCRNUR NONE DETECTED 12-07-23 1935    LABBENZ NONE DETECTED 12-07-2023 1935   AMPHETMU NONE DETECTED 2023-12-07 1935   THCU NONE DETECTED 2023/12/07 1935   LABBARB NONE DETECTED 07-Dec-2023 1935    Alcohol Level     Component Value Date/Time   [facility] <15 12/07/23 1559    Personally reviewed MRI of the brain with a punctate area of acute infarction in the right putamen. CT angiography head and neck: Suboptimal CTA due to functional stenosis of the innominate vein, artifact from pronounced venous contrast reflux in the left neck.  Moderate to severe supraclinoid right ICA stenosis suspected due to calcified plaque.  No other high-grade stenosis identified despite atherosclerosis in  the neck and at the skull base.  Partially visible advanced right apical lung scarring   2D echo: LVEF 60 to 65%, grade 1 diastolic dysfunction, RV function normal.  Mitral valve normal.  Aortic valve is tricuspid and aortic regurgitation not seen.  Left atrium size normal  Carotid ultrasound bilateral-no hemodynamically significant stenosis within the extracranial internal carotid arteries.  ASSESSMENT   Stavros Cail is a 58 y.o. male prior history of stroke with no residual deficit, chronic back pain, COPD, anxiety, hypertension, hyperlipidemia presented to the hospital for evaluation of a syncopal episode at the bank Reports having dizziness for about a week or so. MRI of the brain showed a punctate right putamen stroke. Has multiple small vessel risk factors-likely small vessel etiology stroke.   RECOMMENDATIONS  Aspirin  and Plavix  for 3 weeks followed by aspirin  only He is on atorvastatin  40 at home.  His LDL is under 70 on that dose.  I would continue atorvastatin  40 for now Blood pressure and orthostasis management per primary team Needs outpatient neurology follow-up in 8 to 12 weeks I counseled him on the need for smoking cessation, verbalizes understanding and will work with primary care. Plan discussed with Dr. Caleen. Please  call with questions as needed ______________________________________________________________________    Signed, Gerald Lav, MD [facility]

## 2023-12-06 ENCOUNTER — Telehealth: Payer: Self-pay | Admitting: Physician Assistant

## 2023-12-06 ENCOUNTER — Ambulatory Visit (INDEPENDENT_AMBULATORY_CARE_PROVIDER_SITE_OTHER): Payer: Medicare HMO | Admitting: Physician Assistant

## 2023-12-06 ENCOUNTER — Encounter: Payer: Self-pay | Admitting: Physician Assistant

## 2023-12-06 ENCOUNTER — Telehealth: Payer: Self-pay

## 2023-12-06 VITALS — BP 108/75 | HR 85 | Temp 98.3°F | Resp 16 | Ht 76.0 in | Wt 205.2 lb

## 2023-12-06 DIAGNOSIS — I10 Essential (primary) hypertension: Secondary | ICD-10-CM | POA: Diagnosis not present

## 2023-12-06 DIAGNOSIS — R55 Syncope and collapse: Secondary | ICD-10-CM

## 2023-12-06 DIAGNOSIS — R42 Dizziness and giddiness: Secondary | ICD-10-CM | POA: Diagnosis not present

## 2023-12-06 DIAGNOSIS — F17219 Nicotine dependence, cigarettes, with unspecified nicotine-induced disorders: Secondary | ICD-10-CM

## 2023-12-06 DIAGNOSIS — R5383 Other fatigue: Secondary | ICD-10-CM

## 2023-12-06 DIAGNOSIS — R790 Abnormal level of blood mineral: Secondary | ICD-10-CM

## 2023-12-06 DIAGNOSIS — Z09 Encounter for follow-up examination after completed treatment for conditions other than malignant neoplasm: Secondary | ICD-10-CM

## 2023-12-06 DIAGNOSIS — R0902 Hypoxemia: Secondary | ICD-10-CM | POA: Diagnosis not present

## 2023-12-06 DIAGNOSIS — G4700 Insomnia, unspecified: Secondary | ICD-10-CM

## 2023-12-06 DIAGNOSIS — Z8673 Personal history of transient ischemic attack (TIA), and cerebral infarction without residual deficits: Secondary | ICD-10-CM

## 2023-12-06 DIAGNOSIS — Z1329 Encounter for screening for other suspected endocrine disorder: Secondary | ICD-10-CM

## 2023-12-06 MED ORDER — TRAZODONE HCL 100 MG PO TABS: 100.0000 mg | ORAL_TABLET | Freq: Every day | ORAL | 2 refills | Status: AC

## 2023-12-06 MED ORDER — LISINOPRIL 20 MG PO TABS: 20.0000 mg | ORAL_TABLET | Freq: Every day | ORAL | 3 refills | Status: AC

## 2023-12-06 NOTE — Telephone Encounter (Signed)
 Neurology referral sent via [facility]. Notified patient's aide, Standley Simpers. Gave telephone # [phone]

## 2023-12-06 NOTE — Telephone Encounter (Signed)
 Spoke with [facility] they already received oxygen order from hospital they try to contact him no response they will delivered today and advised pt that make sure he answer his ohne and stay home

## 2023-12-06 NOTE — Progress Notes (Signed)
 [facility] [address] [place], [place] [address]  Internal MEDICINE  Office Visit Note  Patient Name: Gerald Martin  [id number]  [id number]  Date of Service: 12/06/2023     Chief Complaint  Patient presents with   Hospitalization Follow-up    In hospital for dizziness/syncope - did bust head open.     HPI Pt is here for recent hospital follow up. [place] at bank on Friday and hit his head so aide said he needed to go to ED and went. D/c on Monday 11/10 -Found to have acute CVA, AKI, hypoxia, scalp hematoma, and hypotension during hospital stay -BP initially low, but improved with IV fluids. Pt admits he does not drink much water and is often dehydrated. BP meds held, however BP began to spike during stay and home meds resumed. -Started on O2 and states he went home with only single tank. States no concentrator set up and therefore has not been on his oxygen since tank ran out. He is supposed to be on continuous as he desats with ambulation. Will need pulmonary follow up as well. -MRI showed small lacunar infarct involving putamen and neurology was consulted. Patient is to continue DAPT for 30days then ASA alone and should follow up with neurology OP however pt never heard anything about where to schedule. Will need referral still. -He was given compression stockings and advised on changing position slowly due to orthostasis and advised to have neuro evaluation for autonomic dysfunction as well -will be getting PT at home, starting tomorrow -still needs to have cataract removed from right side which is impacting vision -told he had alcohol in his system at ED and he does state he had 1/2 a beer before fall, but states this was 40oz--so 20oz of beer -trouble getting to sleep around 9pm, taking trazodone  prior but doesn't help. May increase to 100mg , but cautioned on this causing drowsiness in AM. Needs to reset sleep schedule to be up during daytime. -aide only  Monday, Wed, Friday.  -has not smoked since Friday -will reduce lisinopril  to 20mg   Current Medication: Outpatient Encounter Medications as of 12/06/2023  Medication Sig   amLODipine  (NORVASC ) 10 MG tablet TAKE 1 TABLET BY MOUTH ONCE DAILY   aspirin  EC (ASPIRIN  EC ADULT LOW DOSE) 81 MG tablet Take 1 tablet (81 mg total) by mouth daily. Swallow whole.   atorvastatin  (LIPITOR) 40 MG tablet TAKE 1 TABLET BY MOUTH ONCE DAILY AT 6:00PM   Budeson-Glycopyrrol-Formoterol (BREZTRI  AEROSPHERE) 160-9-4.8 MCG/ACT AERO Inhale 2 puffs into the lungs 2 (two) times daily.   buPROPion  (WELLBUTRIN  XL) 300 MG 24 hr tablet TAKE 1 TABLET BY MOUTH ONCE DAILY   carvedilol  (COREG ) 6.25 MG tablet TAKE 1 TABLET BY MOUTH TWICE DAILY WITH A MEAL   clopidogrel  (PLAVIX ) 75 MG tablet Take 1 tablet (75 mg total) by mouth daily for 21 days.   COMBIVENT  RESPIMAT 20-100 MCG/ACT AERS respimat Inhale 1 puff into the lungs every 6 (six) hours as needed for wheezing.   FLUoxetine  (PROZAC ) 40 MG capsule TAKE 1 CAPSULE BY MOUTH ONCE DAILY   hydrocortisone  cream 0.5 % Apply 1 Application topically 2 (two) times daily.   INCRUSE ELLIPTA  62.5 MCG/ACT AEPB INHALE 1 PUFF INTO THE LUNGS ONCE DAILY.*RINSE MOUTH AFTER USE AS DIRECTED   lisinopril  (ZESTRIL ) 20 MG tablet Take 1 tablet (20 mg total) by mouth daily.   meclizine  (ANTIVERT ) 25 MG tablet Take 1 tablet (25 mg total) by mouth 3 (three) times daily as needed  for dizziness.   omeprazole  (PRILOSEC) 20 MG capsule TAKE 1 CAPSULE BY MOUTH ONCE DAILY   OXYGEN Inhale into the lungs. 2 litre   traMADol  (ULTRAM ) 50 MG tablet TAKE 1 TABLET BY MOUTH DAILY AS NEEDED FOR PAIN   traZODone  (DESYREL ) 100 MG tablet Take 1 tablet (100 mg total) by mouth at bedtime.   [DISCONTINUED] lisinopril  (ZESTRIL ) 40 MG tablet TAKE 1 TABLET BY MOUTH ONCE DAILY   [DISCONTINUED] traZODone  (DESYREL ) 50 MG tablet TAKE 1 TABLET BY MOUTH AT BEDTIME   No facility-administered encounter medications on file as of  12/06/2023.    Surgical History: Past Surgical History:  Procedure Laterality Date   arm surgery[Other] Left    chain saw injury   chronic pain     LEG SURGERY Right     Medical History: Past Medical History:  Diagnosis Date   Anxiety    Chronic pain    back   COPD (chronic obstructive pulmonary disease) (HCC)    Depression    GERD (gastroesophageal reflux disease)    Hyperlipidemia    Hypertension    Stroke [facility])     Family History: Family History  Problem Relation Age of Onset   Stroke Mother    Diabetes Father     Social History   Socioeconomic History   Marital status: Single    Spouse name: Not on file   Number of children: Not on file   Years of education: Not on file   Highest education level: Not on file  Occupational History   Not on file  Tobacco Use   Smoking status: Former    Current packs/day: 2.00    Average packs/day: 2.0 packs/day for 30.0 years (60.0 ttl pk-yrs)    Types: Cigarettes   Smokeless tobacco: Never   Tobacco comments:    Hasn't smoked since 11/30/23.  Vaping Use   Vaping status: Never Used  Substance and Sexual Activity   Alcohol use: Yes   Drug use: Never   Sexual activity: Not on file  Other Topics Concern   Not on file  Social History Narrative   ** Merged History Encounter **       Social Drivers of Health   Financial Resource Strain: Not on file  Food Insecurity: Patient Declined (12/01/2023)   Hunger Vital Sign    Worried About Running Out of Food in the Last Year: Patient declined    Ran Out of Food in the Last Year: Patient declined  Transportation Needs: Unmet Transportation Needs (12/01/2023)   PRAPARE - Administrator, Civil Service (Medical): Yes    Lack of Transportation (Non-Medical): Yes  Physical Activity: Not on file  Stress: Not on file  Social Connections: Not on file  Intimate Partner Violence: Patient Declined (12/01/2023)   Humiliation, Afraid, Rape, and Kick questionnaire    Fear  of Current or Ex-Partner: Patient declined    Emotionally Abused: Patient declined    Physically Abused: Patient declined    Sexually Abused: Patient declined      Review of Systems  Constitutional:  Positive for fatigue. Negative for chills and unexpected weight change.  HENT:  Negative for congestion, postnasal drip, rhinorrhea, sneezing and sore throat.   Eyes:  Positive for visual disturbance. Negative for redness.  Respiratory:  Negative for cough, chest tightness and shortness of breath.   Cardiovascular:  Negative for chest pain and palpitations.  Gastrointestinal:  Negative for abdominal pain, constipation, diarrhea, nausea and vomiting.  Genitourinary:  Negative  for dysuria and frequency.  Musculoskeletal:  Positive for arthralgias, back pain and gait problem. Negative for joint swelling and neck pain.  Skin:  Negative for rash.  Neurological:  Positive for dizziness, syncope, weakness and light-headedness. Negative for tremors and numbness.  Hematological:  Negative for adenopathy. Does not bruise/bleed easily.  Psychiatric/Behavioral:  Positive for dysphoric mood and sleep disturbance. Negative for self-injury and suicidal ideas. Behavioral problem: Depression.The patient is not nervous/anxious.     Vital Signs: BP 108/75   Pulse 85   Temp 98.3 F (36.8 C)   Resp 16   Ht 6' 4 (1.93 m)   Wt 205 lb 3.2 oz (93.1 kg)   SpO2 95%   BMI 24.98 kg/m    Physical Exam Vitals and nursing note reviewed.  Constitutional:      General: He is not in acute distress.    Appearance: He is well-developed and normal weight. He is ill-appearing. He is not diaphoretic.     Comments: Disheveled appearance  HENT:     Head: Normocephalic.  Neck:     Thyroid : No thyromegaly.     Vascular: No JVD.     Trachea: No tracheal deviation.  Cardiovascular:     Rate and Rhythm: Normal rate and regular rhythm.     Heart sounds: Normal heart sounds. No murmur heard.    No friction rub. No  gallop.  Pulmonary:     Effort: Pulmonary effort is normal.  Skin:    General: Skin is warm and dry.  Neurological:     Mental Status: He is alert and oriented to person, place, and time.     Cranial Nerves: No cranial nerve deficit.     Gait: Gait abnormal.     Comments: Using cane  Psychiatric:        Thought Content: Thought content normal.        Judgment: Judgment normal.       Assessment/Plan: 1. History of CVA (cerebrovascular accident) (Primary) Recent stroke as well as hx of prior CVA, will continue DAPT for 30 days then ASA alone as recommended by neurology, but advised to establish with OP neurololgy - Ambulatory referral to Neurology  2. Hospital discharge follow-up Reviewed hospital course and will update labs - CBC w/Diff/Platelet - Basic Metabolic Panel (BMET)  3. Syncope and collapse Multifactorial with patient dehydrated and orthostatic plus CVA finding. Will reduce BP meds and check labs, but pt advised to change position slowly and utilize walker for safety. Will also have neurology evaluation - Ambulatory referral to Neurology  4. Hypoxia Reached out to [employer] [facility] for oxygen setup. They stated they came by but no one answered the door so unable to set up home oxygen. They are going to try again today and patient was advised to listen for them.  5. Dizziness Multifactorial with patient dehydrated and orthostatic plus CVA finding. Will reduce BP meds and check labs, but pt advised to change position slowly and utilize walker for safety. Will also have neurology evaluation - Ambulatory referral to Neurology  6. Essential hypertension Will reduce form 40 to 20mg  lisinopril  as BP on lower side in office as well. Pt needs to monitor BP at home and does have home health PT coming out tomorrow - lisinopril  (ZESTRIL ) 20 MG tablet; Take 1 tablet (20 mg total) by mouth daily.  Dispense: 90 tablet; Refill: 3  7. Low calcium  levels - Basic Metabolic Panel  (BMET)  8. Thyroid  disorder screen - TSH + free  T4  9. Low ferritin - Fe+TIBC+Fer  10. 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 Will follow up with pulmonology  11. Insomnia, unspecified type May take trazodone  before bed however cautioned on possible drowsiness lingering in AM. Needs to establish better sleep routine and avoid daytime napping. - traZODone  (DESYREL ) 100 MG tablet; Take 1 tablet (100 mg total) by mouth at bedtime.  Dispense: 30 tablet; Refill: 2  12. Other fatigue - CBC w/Diff/Platelet - Basic Metabolic Panel (BMET) - lisinopril  (ZESTRIL ) 20 MG tablet; Take 1 tablet (20 mg total) by mouth daily.  Dispense: 90 tablet; Refill: 3 - TSH + free T4 - Fe+TIBC+Fer   General Counseling: Lucan verbalizes understanding of the findings of todays visit and agrees with plan of treatment. I have discussed any further diagnostic evaluation that may be needed or ordered today. We also reviewed his medications today. he has been encouraged to call the office with any questions or concerns that should arise related to todays visit.    Counseling:    Orders Placed This Encounter  Procedures   CBC w/Diff/Platelet   Basic Metabolic Panel (BMET)   TSH + free T4   Fe+TIBC+Fer   Ambulatory referral to Neurology    This patient was seen by Tinnie Pro, PA-C in collaboration with Dr. Sigrid Bathe as a part of collaborative care agreement.   I have reviewed all medical records from hospital follow up including radiology reports and consults from other physicians. Appropriate follow up diagnostics will be scheduled as needed. Patient/ Family understands the plan of treatment. Time spent 45 minutes.   Dr Sigrid CHRISTELLA Bathe, MD Internal Medicine

## 2023-12-10 ENCOUNTER — Telehealth: Payer: Self-pay | Admitting: Physician Assistant

## 2023-12-10 NOTE — Telephone Encounter (Signed)
 Received home health order from [facility]. Gave to [employer]

## 2023-12-11 ENCOUNTER — Telehealth: Payer: Self-pay | Admitting: Physician Assistant

## 2023-12-11 ENCOUNTER — Telehealth: Payer: Self-pay

## 2023-12-11 NOTE — Telephone Encounter (Signed)
 Gave verbal order for [facility] [phone] pot to tiffany for nursing once a week for 4 week

## 2023-12-11 NOTE — Telephone Encounter (Signed)
 Home health orders signed. Faxed back to [facility]; [phone]. Scanned-Toni

## 2023-12-12 ENCOUNTER — Telehealth: Payer: Self-pay

## 2023-12-12 NOTE — Telephone Encounter (Signed)
 Jeree from [employer] called asking for verbal order for skilled nursing 1 week 4, verbal order given. [phone]

## 2023-12-14 ENCOUNTER — Inpatient Hospital Stay: Admitting: Physician Assistant

## 2023-12-14 ENCOUNTER — Telehealth: Payer: Self-pay | Admitting: Physician Assistant

## 2023-12-14 NOTE — Telephone Encounter (Signed)
 Neurology appointment 03/19/2024 with [facility]-Toni

## 2023-12-18 ENCOUNTER — Telehealth: Payer: Self-pay | Admitting: Physician Assistant

## 2023-12-18 NOTE — Telephone Encounter (Signed)
 Received case communication from [facility]. Gave to [employer]

## 2023-12-19 ENCOUNTER — Ambulatory Visit: Admission: RE | Admit: 2023-12-19 | Source: Ambulatory Visit

## 2023-12-25 NOTE — Plan of Care (Signed)
 Neurology plan of care  Patient presented with dizziness for one week and syncopal episode on day of admission. MRI brain showed punctate area of acute infarction in the R putamen.   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  Moderate to severe supraclinoid right ICA stenosis suspected due to calcified plaque.  No other high-grade stenosis identified despite atherosclerosis in the neck and at the skull base.  Partially visible advanced right apical lung scarring   2D echo: LVEF 60 to 65%, grade 1 diastolic dysfunction, RV function normal.  Mitral valve normal.  Aortic valve is tricuspid and aortic regurgitation not seen.  Left atrium size normal   Carotid ultrasound bilateral-no hemodynamically significant stenosis within the extracranial internal carotid arteries.  A/P: Stroke workup is now completed. Etiology of stroke is small vessel disease. Hospitalist reports that patient still has some dizziness but is orthostatic by vitals. An acute infarct in the putamen would not cause dizziness. She will reach out to neurology again if dizziness not improved with correction of orthostasis.  Medication recommendations for discharge: - ASA 81mg  daily + plavix  75mg  daily x21 days f/b ASA 81mg  daily monotherapy after that - Continue home atorvastatin  40mg  daily, LDL is at goal - Smoking cessation counseling - Ambulatory referral to neurology at discharge for hospital f/u  Gerald Ross, MD [facility] [phone]  If 7pm- 7am, please page neurology on call as listed in AMION.

## 2023-12-26 ENCOUNTER — Other Ambulatory Visit: Payer: Self-pay

## 2023-12-26 DIAGNOSIS — R0602 Shortness of breath: Secondary | ICD-10-CM

## 2023-12-27 ENCOUNTER — Other Ambulatory Visit: Payer: Self-pay | Admitting: Physician Assistant

## 2023-12-31 ENCOUNTER — Ambulatory Visit: Admitting: Internal Medicine

## 2024-01-01 ENCOUNTER — Ambulatory Visit: Admitting: Internal Medicine

## 2024-01-01 ENCOUNTER — Telehealth: Payer: Self-pay | Admitting: Physician Assistant

## 2024-01-01 NOTE — Telephone Encounter (Signed)
 case communication signed & faxed back to [facility]; [phone]. Scanned-Toni

## 2024-01-02 ENCOUNTER — Encounter: Payer: Medicare HMO | Admitting: Internal Medicine

## 2024-01-14 ENCOUNTER — Ambulatory Visit: Admitting: Internal Medicine

## 2024-01-18 ENCOUNTER — Telehealth: Payer: Self-pay

## 2024-01-18 NOTE — Telephone Encounter (Signed)
 [employer] nurse lmom pt insurance house call that pt was complaining about dizziness and weakness and  I called pt care giver aldo as per pt is having dizziness long time advised her he need to go to urgent care and advised her make sure he keep his appt next week

## 2024-01-30 ENCOUNTER — Other Ambulatory Visit: Payer: Self-pay | Admitting: Physician Assistant

## 2024-01-30 ENCOUNTER — Ambulatory Visit: Admitting: Internal Medicine

## 2024-01-30 DIAGNOSIS — I161 Hypertensive emergency: Secondary | ICD-10-CM

## 2024-01-30 DIAGNOSIS — R0602 Shortness of breath: Secondary | ICD-10-CM

## 2024-01-30 DIAGNOSIS — F332 Major depressive disorder, recurrent severe without psychotic features: Secondary | ICD-10-CM

## 2024-02-04 ENCOUNTER — Ambulatory Visit: Admitting: Internal Medicine

## 2024-02-05 ENCOUNTER — Ambulatory Visit: Admitting: Internal Medicine

## 2024-02-07 NOTE — Procedures (Signed)
 [facility] [address] [place], [address]    Complete Pulmonary Function Testing Interpretation:  FINDINGS:  The forced vital capacity is severely decreased.  FEV1 was 1.35 L and is 31% of predicted and is severely decreased.  FEV1 FVC ratio is decreased.  Total lung capacity was decreased mildly.  Residual volume is increased residual volume total lung capacity ratio is increased FRC is increased.  DLCO was severely decreased and normal when corrected for alveolar volume.  Postbronchodilator no significant change in FEV1 is noted.  IMPRESSION:  This pulmonary function study is suggestive of severe obstructive lung disease with a reduction in total lung capacity and air trapping.  Clinical correlation is recommended  Gerald DELENA Bathe, MD [facility] Pulmonary Critical Care Medicine Sleep Medicine

## 2024-02-11 LAB — PULMONARY FUNCTION TEST

## 2025-02-04 ENCOUNTER — Encounter: Admitting: Internal Medicine
